# Patient Record
Sex: Male | Born: 1950 | Race: Black or African American | Hispanic: No | Marital: Married | Smoking: Current some day smoker
Health system: Southern US, Community
[De-identification: ages and names within clinical notes are randomized; demographics above are authoritative.]

## PROBLEM LIST (undated history)

## (undated) DIAGNOSIS — I639 Cerebral infarction, unspecified: Secondary | ICD-10-CM

## (undated) DIAGNOSIS — C3490 Malignant neoplasm of unspecified part of unspecified bronchus or lung: Secondary | ICD-10-CM

## (undated) DIAGNOSIS — R569 Unspecified convulsions: Secondary | ICD-10-CM

---

## 2008-06-23 HISTORY — PX: OTHER SURGICAL HISTORY: SHX169

## 2011-01-04 ENCOUNTER — Encounter: Payer: Self-pay | Admitting: *Deleted

## 2011-01-04 ENCOUNTER — Emergency Department (HOSPITAL_BASED_OUTPATIENT_CLINIC_OR_DEPARTMENT_OTHER)
Admission: EM | Admit: 2011-01-04 | Discharge: 2011-01-04 | Disposition: A | Discharge status: Home / Self Care | Payer: Managed Care, Other (non HMO) | Attending: Emergency Medicine | Admitting: Emergency Medicine

## 2011-01-04 ENCOUNTER — Emergency Department (INDEPENDENT_AMBULATORY_CARE_PROVIDER_SITE_OTHER): Payer: Managed Care, Other (non HMO)

## 2011-01-04 DIAGNOSIS — R531 Weakness: Secondary | ICD-10-CM

## 2011-01-04 DIAGNOSIS — R220 Localized swelling, mass and lump, head: Secondary | ICD-10-CM

## 2011-01-04 DIAGNOSIS — B9789 Other viral agents as the cause of diseases classified elsewhere: Secondary | ICD-10-CM | POA: Insufficient documentation

## 2011-01-04 DIAGNOSIS — R05 Cough: Secondary | ICD-10-CM

## 2011-01-04 DIAGNOSIS — R51 Headache: Secondary | ICD-10-CM

## 2011-01-04 DIAGNOSIS — R1310 Dysphagia, unspecified: Secondary | ICD-10-CM

## 2011-01-04 DIAGNOSIS — F1010 Alcohol abuse, uncomplicated: Secondary | ICD-10-CM

## 2011-01-04 DIAGNOSIS — B349 Viral infection, unspecified: Secondary | ICD-10-CM

## 2011-01-04 DIAGNOSIS — R5381 Other malaise: Secondary | ICD-10-CM | POA: Insufficient documentation

## 2011-01-04 DIAGNOSIS — E049 Nontoxic goiter, unspecified: Secondary | ICD-10-CM

## 2011-01-04 HISTORY — DX: Unspecified convulsions: R56.9

## 2011-01-04 LAB — CBC
HCT: 35.2 % — ABNORMAL LOW (ref 39.0–52.0)
MCV: 86.1 fL (ref 78.0–100.0)
RBC: 4.09 MIL/uL — ABNORMAL LOW (ref 4.22–5.81)
WBC: 8.8 10*3/uL (ref 4.0–10.5)

## 2011-01-04 LAB — DIFFERENTIAL
Eosinophils Relative: 2 % (ref 0–5)
Lymphocytes Relative: 26 % (ref 12–46)
Lymphs Abs: 2.2 10*3/uL (ref 0.7–4.0)
Monocytes Absolute: 0.9 10*3/uL (ref 0.1–1.0)
Monocytes Relative: 10 % (ref 3–12)

## 2011-01-04 LAB — COMPREHENSIVE METABOLIC PANEL
AST: 30 U/L (ref 0–37)
BUN: 8 mg/dL (ref 6–23)
CO2: 25 mEq/L (ref 19–32)
Chloride: 100 mEq/L (ref 96–112)
Creatinine, Ser: 0.9 mg/dL (ref 0.50–1.35)
GFR calc non Af Amer: >60 mL/min (ref >60)
Total Bilirubin: 0.2 mg/dL — ABNORMAL LOW (ref 0.3–1.2)

## 2011-01-04 LAB — ETHANOL: Alcohol, Ethyl (B): 99 mg/dL — ABNORMAL HIGH (ref 0–11)

## 2011-01-04 LAB — GLUCOSE, CAPILLARY: Glucose-Capillary: 112 mg/dL — ABNORMAL HIGH (ref 70–99)

## 2011-01-04 MED ORDER — SODIUM CHLORIDE 0.9 % IV SOLN
Freq: Once | INTRAVENOUS | Status: AC
  Administered 2011-01-04: 19:00:00 via INTRAVENOUS

## 2011-01-04 MED ORDER — ACETAMINOPHEN 325 MG PO TABS
650.0000 mg | ORAL_TABLET | Freq: Once | ORAL | Status: AC
  Administered 2011-01-04: 650 mg via ORAL
  Filled 2011-01-04: qty 2

## 2011-01-04 MED ORDER — IOHEXOL 300 MG/ML  SOLN
100.0000 mL | Freq: Once | INTRAMUSCULAR | Status: AC | PRN
  Administered 2011-01-04: 100 mL via INTRAVENOUS

## 2011-01-04 NOTE — ED Provider Notes (Signed)
History     Chief Complaint  Patient presents with  . Fatigue   chief complaint weakness HPI History is obtained from patient and from his spouse. Complains of nasal congestion sore throat cough( slight) onset 3 days ago patient drank alcohol this morning to "ease the pain when asked what hurts the most he states his throat also complains of nasal congestion. Temperature 102 his morning 3 AM is reported by his wife treated with Tylenol last dose 3 AM today Past Medical History  Diagnosis Date  . Seizures     Past Surgical History  Procedure Date  . Other surgical history 2010    nasal non cancerous tumor removed   Pituitary tumor History reviewed. No pertinent family history.  History  Substance Use Topics  . Smoking status: Current Some Day Smoker  . Smokeless tobacco: Not on file  . Alcohol Use: Yes      Review of Systems  Constitutional: Positive for fever and appetite change.  HENT: Positive for congestion.        Sore throat pain on swallowing  Respiratory: Positive for cough.   Cardiovascular: Negative.   Gastrointestinal: Negative.   Musculoskeletal: Negative.   Skin: Negative.   Neurological: Negative.   Hematological: Negative.   Psychiatric/Behavioral: Negative.     Physical Exam  BP 112/72  Pulse 79  Temp(Src) 98.6 F (37 C) (Oral)  Resp 19  SpO2 99%  Physical Exam  Constitutional: He is oriented to person, place, and time. He appears well-developed and well-nourished.       Sleepy, easily arousable  HENT:  Head: Normocephalic and atraumatic.  Right Ear: External ear normal.  Left Ear: External ear normal.  Nose: Nose normal.  Mouth/Throat: Oropharynx is clear and moist. No oropharyngeal exudate.  Eyes: Conjunctivae are normal. Pupils are equal, round, and reactive to light.  Neck: Neck supple. No tracheal deviation present. No thyromegaly present.       Pain on palpation the laryngeal cartilage  Cardiovascular: Normal rate and regular  rhythm.   No murmur heard. Pulmonary/Chest: Effort normal and breath sounds normal.  Abdominal: Soft. Bowel sounds are normal. He exhibits no distension. There is no tenderness.  Musculoskeletal: Normal range of motion. He exhibits no edema and no tenderness.  Neurological: He is oriented to person, place, and time. Coordination normal.       Sleepy easily arousable answers appropriately  Skin: Skin is warm and dry. No rash noted.  Psychiatric: He has a normal mood and affect.    ED Course  Procedures  MDM At 9 PM patient is alert Glasgow Coma Score 15 feels improved after intravenous fluids given apple juice in the emergency department no longer sleepy. Assessment: Patient likely with viral illness in light of nasal congestion and sore throat. clinically intoxicated upon arrival 9 PM no longer appears intoxicated and her plan patient advised to follow up with his primary care doctor Dr.  Lendon Colonel. Ultrasound of thyroid gland within the next few months discharged in stable condition      Doug Sou, MD 01/04/11 2142

## 2011-01-04 NOTE — ED Notes (Signed)
MD at bedside. 

## 2011-01-04 NOTE — ED Notes (Signed)
Per family member patient has been feeling bad since Wednesday, night sweats, sore throat, body aches, productive cough, hurts to cough, no energy, no n/v/d, able to drink & eat but appetite reduced

## 2011-01-04 NOTE — ED Notes (Signed)
Previous 02 readings inaccurate d/t pt not having sensor on his hand correctly. This was repositioned.

## 2012-06-14 IMAGING — CR DG CHEST 2V
2 series · 2 of 2 positions shown · non-contrast
Comparison: None

CLINICAL DATA: Cough.  Swelling in the throat.

CHEST - 2 VIEW

[w chest pa]
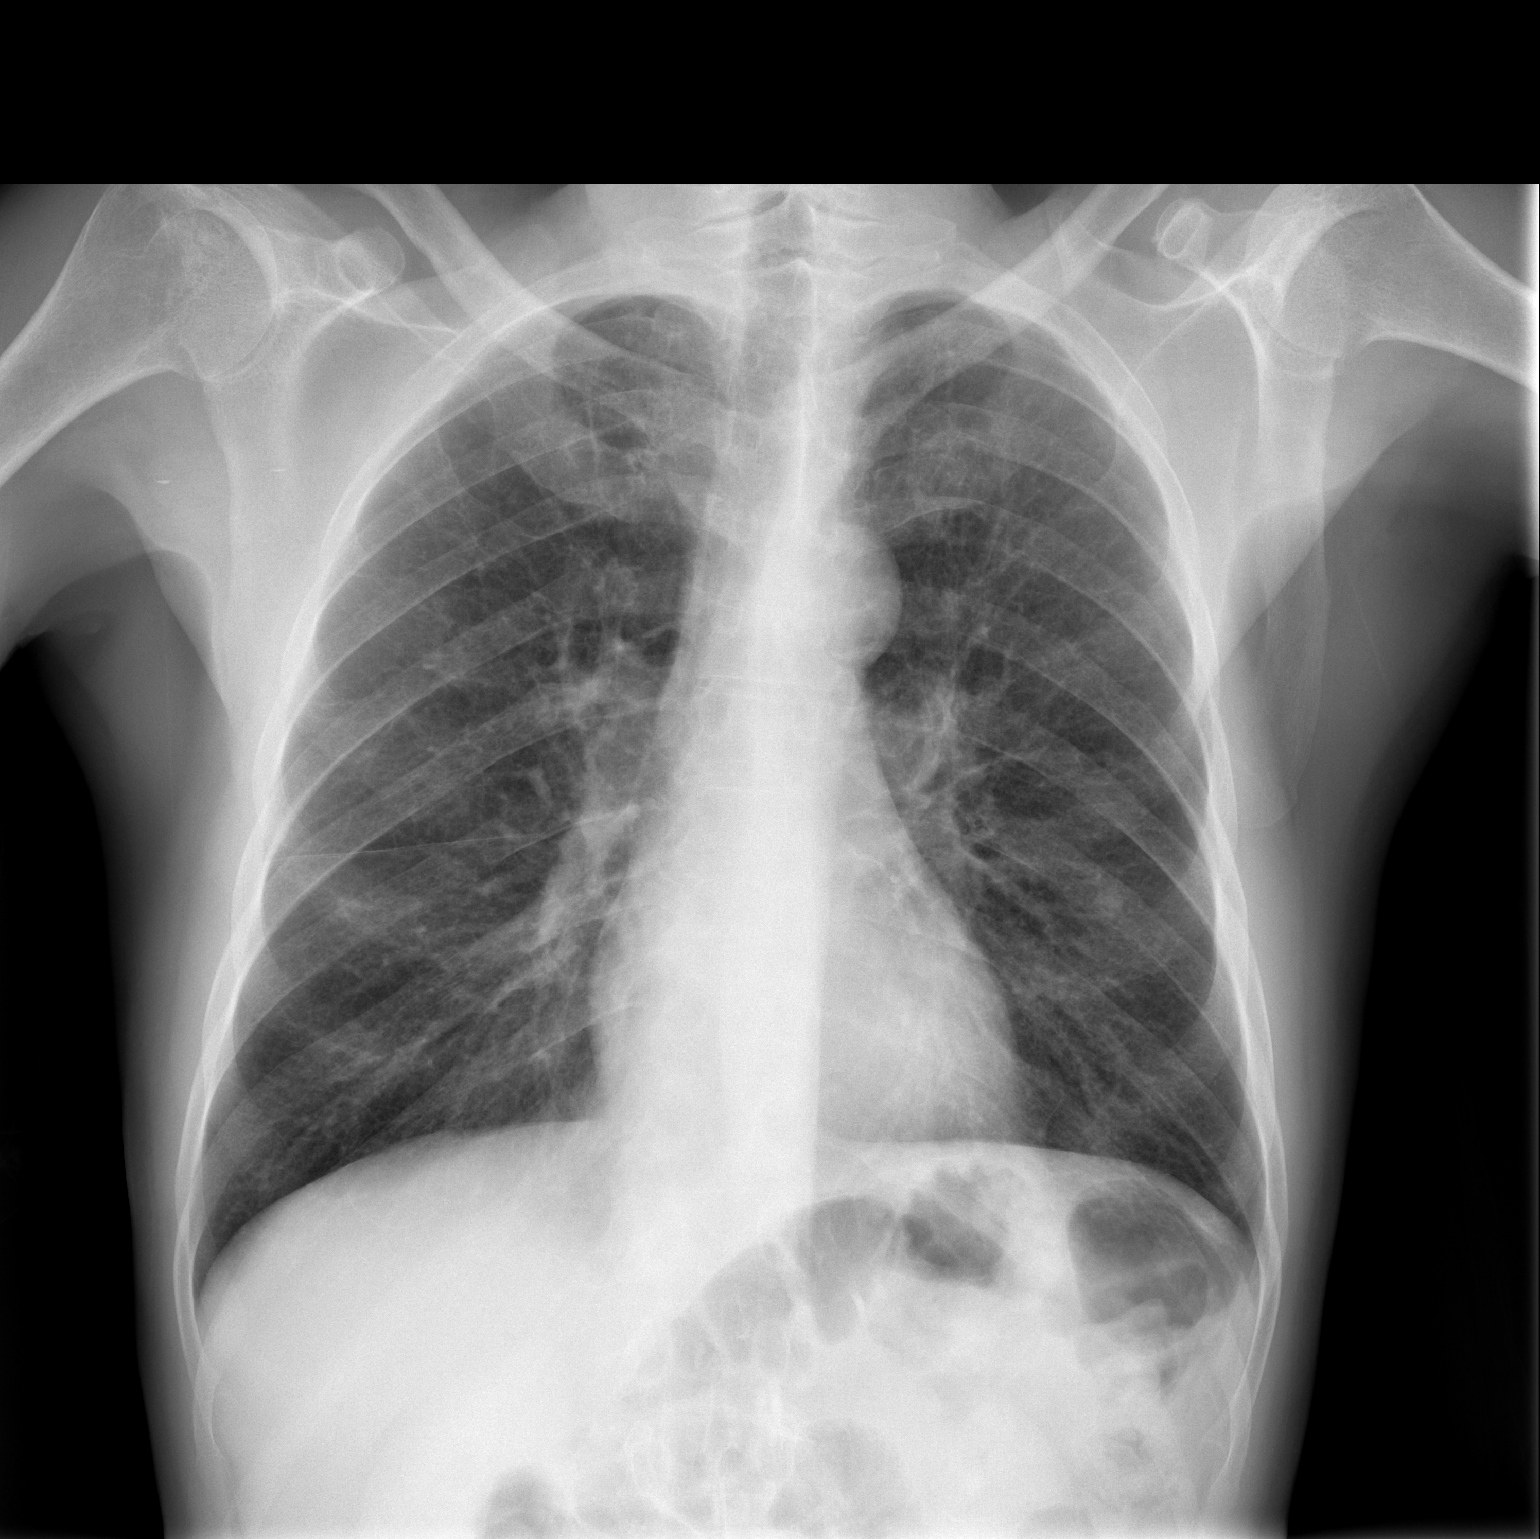

[w chest lat]
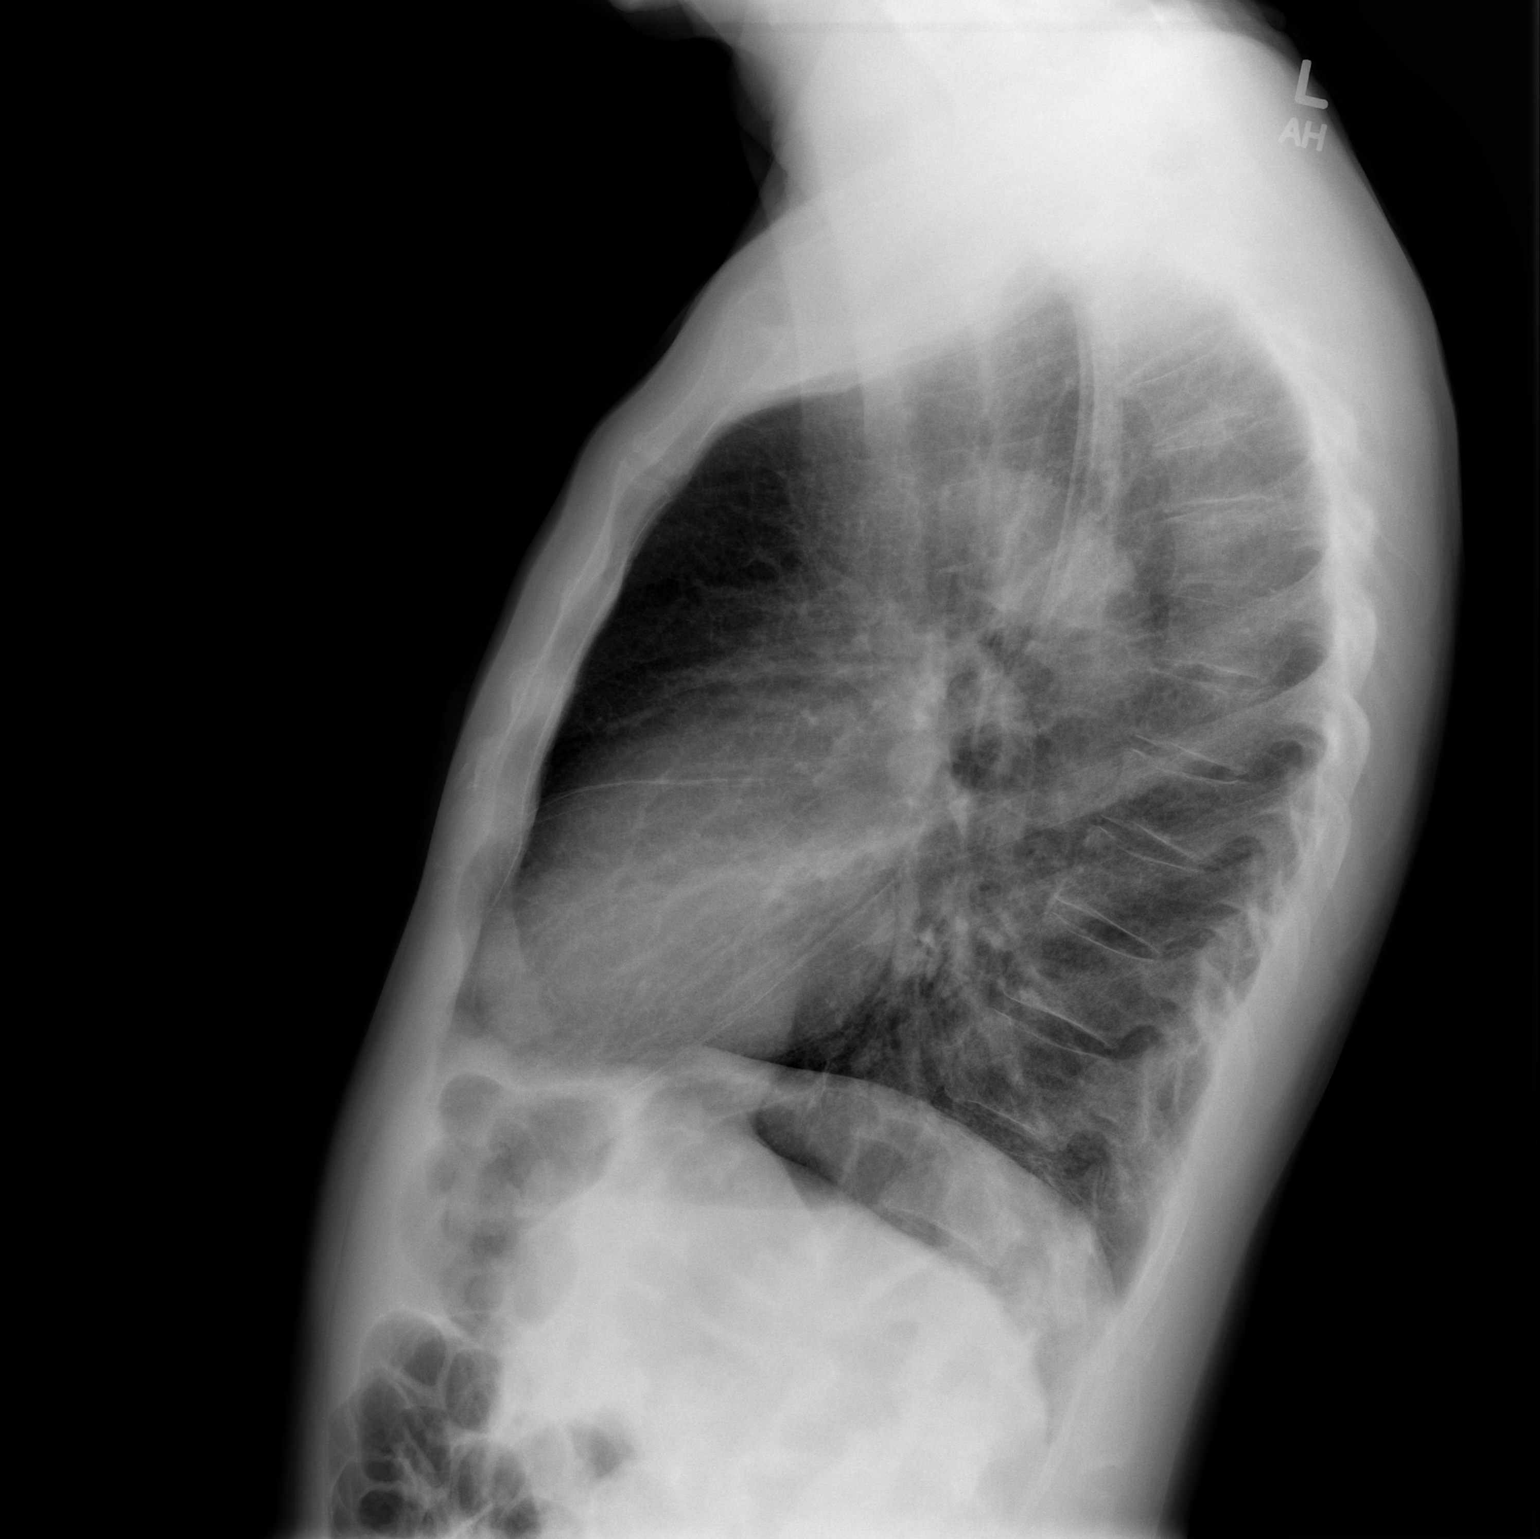

[2 of 2 positions shown; findings below may reference images not displayed]

FINDINGS: Heart size is normal.  Mediastinal shadows are normal.
The lung shows slightly prominent interstitial markings but no
evidence of infiltrate, mass, effusion or collapse.  I think there
are probably bilateral nipple shadows.  No significant bony
finding.
IMPRESSION: No definite active process.  Slightly prominent interstitial
markings.  These are probably chronic.  Probable bilateral nipple
shadows.

## 2012-11-27 DIAGNOSIS — I10 Essential (primary) hypertension: Secondary | ICD-10-CM | POA: Diagnosis present

## 2012-11-27 DIAGNOSIS — J449 Chronic obstructive pulmonary disease, unspecified: Secondary | ICD-10-CM | POA: Diagnosis present

## 2017-12-22 DIAGNOSIS — F17200 Nicotine dependence, unspecified, uncomplicated: Secondary | ICD-10-CM | POA: Diagnosis present

## 2022-12-29 ENCOUNTER — Other Ambulatory Visit: Payer: Self-pay

## 2022-12-29 ENCOUNTER — Emergency Department (HOSPITAL_BASED_OUTPATIENT_CLINIC_OR_DEPARTMENT_OTHER): Payer: Medicare PPO

## 2022-12-29 ENCOUNTER — Encounter (HOSPITAL_BASED_OUTPATIENT_CLINIC_OR_DEPARTMENT_OTHER): Payer: Self-pay | Admitting: Radiology

## 2022-12-29 ENCOUNTER — Inpatient Hospital Stay (HOSPITAL_BASED_OUTPATIENT_CLINIC_OR_DEPARTMENT_OTHER)
Admission: EM | Admit: 2022-12-29 | Discharge: 2023-01-05 | DRG: 438 | Disposition: A | Discharge status: Home / Self Care | Payer: Medicare PPO | Attending: Obstetrics and Gynecology | Admitting: Obstetrics and Gynecology

## 2022-12-29 DIAGNOSIS — R0902 Hypoxemia: Secondary | ICD-10-CM | POA: Diagnosis not present

## 2022-12-29 DIAGNOSIS — K567 Ileus, unspecified: Secondary | ICD-10-CM | POA: Diagnosis not present

## 2022-12-29 DIAGNOSIS — E876 Hypokalemia: Secondary | ICD-10-CM | POA: Diagnosis not present

## 2022-12-29 DIAGNOSIS — J449 Chronic obstructive pulmonary disease, unspecified: Secondary | ICD-10-CM | POA: Diagnosis not present

## 2022-12-29 DIAGNOSIS — R112 Nausea with vomiting, unspecified: Secondary | ICD-10-CM | POA: Diagnosis present

## 2022-12-29 DIAGNOSIS — F1010 Alcohol abuse, uncomplicated: Secondary | ICD-10-CM | POA: Diagnosis present

## 2022-12-29 DIAGNOSIS — Z79899 Other long term (current) drug therapy: Secondary | ICD-10-CM | POA: Diagnosis not present

## 2022-12-29 DIAGNOSIS — Z681 Body mass index (BMI) 19 or less, adult: Secondary | ICD-10-CM

## 2022-12-29 DIAGNOSIS — K860 Alcohol-induced chronic pancreatitis: Secondary | ICD-10-CM | POA: Diagnosis present

## 2022-12-29 DIAGNOSIS — I10 Essential (primary) hypertension: Secondary | ICD-10-CM | POA: Diagnosis not present

## 2022-12-29 DIAGNOSIS — F17200 Nicotine dependence, unspecified, uncomplicated: Secondary | ICD-10-CM | POA: Diagnosis present

## 2022-12-29 DIAGNOSIS — K852 Alcohol induced acute pancreatitis without necrosis or infection: Principal | ICD-10-CM | POA: Diagnosis present

## 2022-12-29 DIAGNOSIS — K7010 Alcoholic hepatitis without ascites: Secondary | ICD-10-CM | POA: Diagnosis not present

## 2022-12-29 DIAGNOSIS — E785 Hyperlipidemia, unspecified: Secondary | ICD-10-CM | POA: Diagnosis not present

## 2022-12-29 DIAGNOSIS — R64 Cachexia: Secondary | ICD-10-CM | POA: Diagnosis not present

## 2022-12-29 DIAGNOSIS — E43 Unspecified severe protein-calorie malnutrition: Secondary | ICD-10-CM | POA: Diagnosis present

## 2022-12-29 DIAGNOSIS — I493 Ventricular premature depolarization: Secondary | ICD-10-CM | POA: Diagnosis present

## 2022-12-29 DIAGNOSIS — Z886 Allergy status to analgesic agent status: Secondary | ICD-10-CM

## 2022-12-29 DIAGNOSIS — G40909 Epilepsy, unspecified, not intractable, without status epilepticus: Secondary | ICD-10-CM | POA: Diagnosis not present

## 2022-12-29 DIAGNOSIS — R918 Other nonspecific abnormal finding of lung field: Secondary | ICD-10-CM | POA: Insufficient documentation

## 2022-12-29 LAB — CBC WITH DIFFERENTIAL/PLATELET
Abs Immature Granulocytes: 0.03 10*3/uL (ref 0.00–0.07)
Basophils Absolute: 0 10*3/uL (ref 0.0–0.1)
Basophils Relative: 0 %
Eosinophils Absolute: 0 10*3/uL (ref 0.0–0.5)
Eosinophils Relative: 0 %
HCT: 32.6 % — ABNORMAL LOW (ref 39.0–52.0)
Hemoglobin: 11.1 g/dL — ABNORMAL LOW (ref 13.0–17.0)
Immature Granulocytes: 0 %
Lymphocytes Relative: 11 %
Lymphs Abs: 1.1 10*3/uL (ref 0.7–4.0)
MCH: 29 pg (ref 26.0–34.0)
MCHC: 34 g/dL (ref 30.0–36.0)
MCV: 85.1 fL (ref 80.0–100.0)
Monocytes Absolute: 0.4 10*3/uL (ref 0.1–1.0)
Monocytes Relative: 4 %
Neutro Abs: 7.9 10*3/uL — ABNORMAL HIGH (ref 1.7–7.7)
Neutrophils Relative %: 85 %
Platelets: 163 10*3/uL (ref 150–400)
RBC: 3.83 MIL/uL — ABNORMAL LOW (ref 4.22–5.81)
RDW: 13.3 % (ref 11.5–15.5)
WBC: 9.5 10*3/uL (ref 4.0–10.5)
nRBC: 0 % (ref 0.0–0.2)

## 2022-12-29 LAB — COMPREHENSIVE METABOLIC PANEL
ALT: 121 U/L — ABNORMAL HIGH (ref 0–44)
AST: 187 U/L — ABNORMAL HIGH (ref 15–41)
Albumin: 3.4 g/dL — ABNORMAL LOW (ref 3.5–5.0)
Alkaline Phosphatase: 124 U/L (ref 38–126)
Anion gap: 13 (ref 5–15)
BUN: 24 mg/dL — ABNORMAL HIGH (ref 8–23)
CO2: 21 mmol/L — ABNORMAL LOW (ref 22–32)
Calcium: 9 mg/dL (ref 8.9–10.3)
Chloride: 103 mmol/L (ref 98–111)
Creatinine, Ser: 0.93 mg/dL (ref 0.61–1.24)
GFR, Estimated: >60 mL/min (ref >60)
Glucose, Bld: 121 mg/dL — ABNORMAL HIGH (ref 70–99)
Potassium: 3.9 mmol/L (ref 3.5–5.1)
Sodium: 137 mmol/L (ref 135–145)
Total Bilirubin: 0.6 mg/dL (ref 0.3–1.2)
Total Protein: 7.7 g/dL (ref 6.5–8.1)

## 2022-12-29 LAB — TROPONIN I (HIGH SENSITIVITY)
Troponin I (High Sensitivity): 4 ng/L (ref <18)
Troponin I (High Sensitivity): 5 ng/L (ref <18)

## 2022-12-29 LAB — LACTIC ACID, PLASMA
Lactic Acid, Venous: 2.3 mmol/L (ref 0.5–1.9)
Lactic Acid, Venous: 1.1 mmol/L (ref 0.5–1.9)

## 2022-12-29 LAB — ETHANOL: Alcohol, Ethyl (B): <10 mg/dL (ref <10)

## 2022-12-29 LAB — LIPASE, BLOOD: Lipase: 335 U/L — ABNORMAL HIGH (ref 11–51)

## 2022-12-29 MED ORDER — SODIUM CHLORIDE 0.9 % IV BOLUS
1000.0000 mL | Freq: Once | INTRAVENOUS | Status: AC
  Administered 2022-12-29: 1000 mL via INTRAVENOUS

## 2022-12-29 MED ORDER — ONDANSETRON HCL 4 MG/2ML IJ SOLN
4.0000 mg | Freq: Once | INTRAMUSCULAR | Status: AC
  Administered 2022-12-29: 4 mg via INTRAVENOUS
  Filled 2022-12-29: qty 2

## 2022-12-29 MED ORDER — ONDANSETRON HCL 4 MG PO TABS: 4.0000 mg | ORAL_TABLET | Freq: Four times a day (QID) | ORAL | Status: DC | PRN

## 2022-12-29 MED ORDER — TRAMADOL HCL 50 MG PO TABS
50.0000 mg | ORAL_TABLET | Freq: Three times a day (TID) | ORAL | Status: DC | PRN
  Administered 2022-12-29 – 2022-12-31 (×5): 50 mg via ORAL
  Filled 2022-12-29 (×5): qty 1

## 2022-12-29 MED ORDER — FENTANYL CITRATE PF 50 MCG/ML IJ SOSY
50.0000 ug | PREFILLED_SYRINGE | Freq: Once | INTRAMUSCULAR | Status: AC
  Administered 2022-12-29: 50 ug via INTRAVENOUS
  Filled 2022-12-29: qty 1

## 2022-12-29 MED ORDER — MAGNESIUM HYDROXIDE 400 MG/5ML PO SUSP: 30.0000 mL | Freq: Every day | ORAL | Status: DC | PRN

## 2022-12-29 MED ORDER — PANTOPRAZOLE SODIUM 40 MG IV SOLR
40.0000 mg | Freq: Once | INTRAVENOUS | Status: AC
  Administered 2022-12-29: 40 mg via INTRAVENOUS
  Filled 2022-12-29: qty 10

## 2022-12-29 MED ORDER — TRAZODONE HCL 50 MG PO TABS
25.0000 mg | ORAL_TABLET | Freq: Every evening | ORAL | Status: DC | PRN
  Administered 2022-12-29 – 2023-01-04 (×3): 25 mg via ORAL
  Filled 2022-12-29 (×3): qty 1

## 2022-12-29 MED ORDER — IOHEXOL 300 MG/ML  SOLN
100.0000 mL | Freq: Once | INTRAMUSCULAR | Status: AC | PRN
  Administered 2022-12-29: 100 mL via INTRAVENOUS

## 2022-12-29 MED ORDER — ENOXAPARIN SODIUM 40 MG/0.4ML IJ SOSY
40.0000 mg | PREFILLED_SYRINGE | INTRAMUSCULAR | Status: DC
  Administered 2022-12-29 – 2023-01-04 (×6): 40 mg via SUBCUTANEOUS
  Filled 2022-12-29 (×7): qty 0.4

## 2022-12-29 MED ORDER — ONDANSETRON HCL 4 MG/2ML IJ SOLN
4.0000 mg | Freq: Four times a day (QID) | INTRAMUSCULAR | Status: DC | PRN
  Administered 2022-12-31: 4 mg via INTRAVENOUS
  Filled 2022-12-29: qty 2

## 2022-12-29 MED ORDER — SODIUM CHLORIDE 0.9 % IV SOLN: INTRAVENOUS | Status: DC

## 2022-12-29 NOTE — Plan of Care (Signed)
Called for direct admission by Dr Fredderick Phenix.   72 y/o with abdominal pain who drinks alcohol heavily found to have elevated Lipase and elevated LFTs.  No CT evidence of acute pancreatitis, cholecystitis or dilated bile ducts but upon my review of the report the patient does have calcifications in his pancreas with a dilated pancreatic duct and a fatty liver.  Lactic acid is mildly elevated at 2.3. Bicarb 21.  He has received IVF, IV antiemetics and IV narcotics.  Will place him under observation status for his symptoms.    Calvert Cantor, MD

## 2022-12-29 NOTE — ED Notes (Signed)
Pt. Has gone to CT scan after pain meds given

## 2022-12-29 NOTE — ED Triage Notes (Signed)
Patient presents to ED via POV from home. Here with abdominal pain that began around 0600. Family reports patient is not acting his normal self. Family report he is not talking. Patient verbal in triage. Wincing in pain. Family expresses concern for ruptured appendix. Last known well was 0600 this morning. Denies one sided weakness.

## 2022-12-29 NOTE — Assessment & Plan Note (Signed)
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 - The patient will be admitted to a medical observation bed. - He will be kept NPO. - Pain management will be provided. - Serial lipase levels will be followed.

## 2022-12-29 NOTE — ED Provider Notes (Signed)
[name] Provider Note   CSN: [id number] Arrival date & time: 12/29/22  1107     History  Chief Complaint  Patient presents with   Abdominal Pain    Lonnie Blair is a 72 y.o. male.  Patient is a 72 year old male who presents with abdominal pain.  History is obtained through the patient and his wife/daughter.  He has a history of COPD, hyperlipidemia heavy alcohol use per his wife.  He reportedly drinks beer daily.  He had started having some abdominal pain this morning.  It is coming and sharp waves.  It is across his upper abdomen primarily.  He had some associated nausea and vomiting.  His emesis is nonbloody and nonbilious.  No change in his stools.  No urinary symptoms.  Initially it did seemed that he was not communicating effectively although this seems to be more related to his pain.         Home Medications Prior to Admission medications   Medication Sig Start Date End Date Taking? Authorizing Provider  acetaminophen (TYLENOL) 325 MG tablet Take 650 mg by mouth as needed.      [provider]  acetaminophen (TYLENOL) 500 MG tablet Take 500 mg by mouth once as needed. For pain and fever     [provider]  levETIRAcetam (KEPPRA) 1000 MG tablet Take 1,000 mg by mouth 2 (two) times daily.      [provider]  PRESCRIPTION MEDICATION     [provider]  UNABLE TO FIND Med Name:     [provider]      Allergies    Tylenol [acetaminophen]    Review of Systems   Review of Systems  Constitutional:  Positive for fatigue. Negative for chills, diaphoresis and fever.  HENT:  Negative for congestion, rhinorrhea and sneezing.   Eyes: Negative.   Respiratory:  Negative for cough, chest tightness and shortness of breath.   Cardiovascular:  Negative for chest pain and leg swelling.  Gastrointestinal:  Positive for abdominal pain, nausea and vomiting. Negative for blood in stool and  diarrhea.  Genitourinary:  Negative for difficulty urinating, flank pain, frequency and hematuria.  Musculoskeletal:  Negative for arthralgias and back pain.  Skin:  Negative for rash.  Neurological:  Negative for dizziness, speech difficulty, weakness, numbness and headaches.    Physical Exam Updated Vital Signs BP (!) 149/86   Pulse 86   Temp (!) 97 F (36.1 C)   Resp 19   SpO2 100%  Physical Exam Constitutional:      Appearance: He is well-developed.  HENT:     Head: Normocephalic and atraumatic.  Eyes:     Pupils: Pupils are equal, round, and reactive to light.  Cardiovascular:     Rate and Rhythm: Normal rate and regular rhythm.     Heart sounds: Normal heart sounds.  Pulmonary:     Effort: Pulmonary effort is normal. No respiratory distress.     Breath sounds: Normal breath sounds. No wheezing or rales.  Chest:     Chest wall: No tenderness.  Abdominal:     General: Bowel sounds are normal.     Palpations: Abdomen is soft.     Tenderness: There is generalized abdominal tenderness. There is no guarding or rebound.  Musculoskeletal:        General: Normal range of motion.     Cervical back: Normal range of motion and neck supple.  Lymphadenopathy:  Cervical: No cervical adenopathy.  Skin:    General: Skin is warm and dry.     Findings: No rash.  Neurological:     General: No focal deficit present.     Mental Status: He is alert and oriented to person, place, and time.     ED Results / Procedures / Treatments   Labs (all labs ordered are listed, but only abnormal results are displayed) Labs Reviewed  COMPREHENSIVE METABOLIC PANEL - Abnormal; Notable for the following components:      Result Value   CO2 21 (*)    Glucose, Bld 121 (*)    BUN 24 (*)    Albumin 3.4 (*)    AST 187 (*)    ALT 121 (*)    All other components within normal limits  LIPASE, BLOOD - Abnormal; Notable for the following components:   Lipase 335 (*)    All other components  within normal limits  LACTIC ACID, PLASMA - Abnormal; Notable for the following components:   Lactic Acid, Venous 2.3 (*)    All other components within normal limits  CBC WITH DIFFERENTIAL/PLATELET - Abnormal; Notable for the following components:   RBC 3.83 (*)    Hemoglobin 11.1 (*)    HCT 32.6 (*)    Neutro Abs 7.9 (*)    All other components within normal limits  ETHANOL  LACTIC ACID, PLASMA  TROPONIN I (HIGH SENSITIVITY)  TROPONIN I (HIGH SENSITIVITY)    EKG EKG Interpretation Date/Time:  Monday December 29 2022 11:27:08 EDT Ventricular Rate:  84 PR Interval:  144 QRS Duration:  78 QT Interval:  363 QTC Calculation: 430 R Axis:   83  Text Interpretation: Sinus rhythm Ventricular premature complex Right atrial enlargement Borderline right axis deviation ST elevation, consider inferior injury No old tracing to compare Confirmed by Rolan Bucco [phone]) on 12/29/2022 11:45:50 AM  Radiology CT ABDOMEN PELVIS W CONTRAST  Result Date: 12/29/2022 CLINICAL DATA:  Abdominal pain, acute, nonlocalized EXAM: CT ABDOMEN AND PELVIS WITH CONTRAST TECHNIQUE: Multidetector CT imaging of the abdomen and pelvis was performed using the standard protocol following bolus administration of intravenous contrast. RADIATION DOSE REDUCTION: This exam was performed according to the departmental dose-optimization program which includes automated exposure control, adjustment of the mA and/or kV according to patient size and/or use of iterative reconstruction technique. CONTRAST:  OMNIPAQUE IOHEXOL 300 MG/ML  SOLN COMPARISON:  CT scan chest from 04/25/2021 and CT scan abdomen and pelvis from 01/10/2013. FINDINGS: Lower chest: The lung bases are clear. Scattered emphysematous blebs noted in the imaged lung bases. No pleural effusion. The heart is normal in size. No pericardial effusion. Hepatobiliary: The liver is normal in size. Non-cirrhotic configuration. No suspicious mass. These is mild diffuse hepatic  steatosis. No intrahepatic or extrahepatic bile duct dilation. No calcified gallstones. Normal gallbladder wall thickness. No pericholecystic inflammatory changes. Pancreas: No focal pancreatic lesion. Main pancreatic duct is dilated measuring up to 6-7 mm; however, no obstructing mass seen. There are scattered sub 5 mm foci of calcifications in the pancreas, nonspecific but can be seen with sequela of chronic pancreatitis. No peripancreatic fat stranding. Spleen: Within normal limits. No focal lesion. Adrenals/Urinary Tract: Adrenal glands are unremarkable. No suspicious renal mass. No hydronephrosis. No renal or ureteric calculi. Unremarkable urinary bladder. Stomach/Bowel: No disproportionate dilation of the small or large bowel loops. No evidence of abnormal bowel wall thickening or inflammatory changes. The appendix was not confidently visualized; however there is no acute inflammatory process in  the right lower quadrant. Vascular/Lymphatic: No ascites or pneumoperitoneum. No abdominal or pelvic lymphadenopathy, by size criteria. No aneurysmal dilation of the major abdominal arteries. There are marked peripheral atherosclerotic vascular calcifications of the aorta and its major branches. Reproductive: Normal size prostate. Symmetric seminal vesicles. Other: The visualized soft tissues and abdominal wall are unremarkable. Musculoskeletal: No suspicious osseous lesions. There are mild multilevel degenerative changes in the visualized spine. IMPRESSION: 1. No acute inflammatory process identified within the abdomen or pelvis. 2. Multiple other nonacute observations, as described above. Aortic Atherosclerosis (ICD10-I70.0) and Emphysema (ICD10-J43.9). Electronically Signed   By: Jules Schick M.D.   On: 12/29/2022 14:02    Procedures Procedures    Medications Ordered in ED Medications  pantoprazole (PROTONIX) injection 40 mg (40 mg Intravenous Given 12/29/22 1210)  sodium chloride 0.9 % bolus 1,000 mL (0  mLs Intravenous Stopped 12/29/22 1420)  ondansetron (ZOFRAN) injection 4 mg (4 mg Intravenous Given 12/29/22 1208)  iohexol (OMNIPAQUE) 300 MG/ML solution 100 mL (100 mLs Intravenous Contrast Given 12/29/22 1334)  fentaNYL (SUBLIMAZE) injection 50 mcg (50 mcg Intravenous Given 12/29/22 1325)    ED Course/ Medical Decision Making/ A&P                             Medical Decision Making Amount and/or Complexity of Data Reviewed Labs: ordered. Radiology: ordered.  Risk Prescription drug management. Decision regarding hospitalization.   Patient is a 72 year old male who presents with intense abdominal pain.  This mostly across his upper abdomen.  She had associated vomiting.  His blood pressure was borderline low on arrival but has been improving after IV fluids.  His pain is improved with pain medication and antiemetics.  His labs show an elevated lipase consistent with pancreatitis.  His LFTs are also elevated which is likely from his alcohol use although we do not have any recent LFTs on file for him.  CT scan shows no acute abnormalities.  No evidence of gallbladder wall thickening or gallstones.  His lactate is mildly elevated.  However he is afebrile and his WBC count is normal.  Will hold off on antibiotics as he does not have any other indications of sepsis or infectious etiology.  He was given Protonix as well.  Discussed with Dr. Butler Denmark who will admit the patient for further treatment.  Final Clinical Impression(s) / ED Diagnoses Final diagnoses:  Alcohol-induced acute pancreatitis without infection or necrosis    Rx / DC Orders ED Discharge Orders     None         Rolan Bucco, MD 12/29/22 1436

## 2022-12-29 NOTE — ED Notes (Signed)
Attempting new IV site due to Other site bothering Pt.

## 2022-12-29 NOTE — H&P (Signed)
[place]   PATIENT NAME: Lonnie Blair    MR#:  [id number]  DATE OF BIRTH:  [birth year]  DATE OF ADMISSION:  12/29/2022  PRIMARY CARE PHYSICIAN: Diamantina Providence, FNP   Patient is coming from: Home  REQUESTING/REFERRING PHYSICIAN: [facility]  CHIEF COMPLAINT:   Chief Complaint  Patient presents with   Abdominal Pain    HISTORY OF PRESENT ILLNESS:  Avetis Caster is a 72 y.o. male with medical history significant for seizure disorder as well as alcohol abuse, presented to the emergency room with a [place]etti of epigastric and left upper quadrant abdominal pain that started at 3 AM today with associated recurrent nausea and vomiting without diarrhea.  His last alcoholic drink was beer yesterday.  He admitted to chills without measured fever.  Has been having mild urinary urgency without frequency or dysuria or hematuria or flank pain.  No chest pain or palpitations.  No cough or wheezing or dyspnea.  ED Course: When he came to the ER, his.  Gwyndolyn Kaufman rate was 25 and later on 21 with otherwise normal vital signs.  Labs revealed a BUN of 24 with CO2 of 21, albumin 3.4 and elevated lipase of 335 AST of 187 and ALT of 121.  High sensitive troponin I was 4 and later 5 and lactic acid 2.3 and later 1.1.  CBC showed anemia with hemoglobin 11.1 hematocrit 32.6 slightly lower than previous levels 12 years ago.  Alcohol level was less than 10. EKG as reviewed by me : EKG showed sinus rhythm with a rate of 84 with PVCs and right atrial enlargement.  It showed borderline right axis deviation. Imaging: Abdominal pelvic CT scan revealed aortic atherosclerosis and emphysema with no acute abdominal or pelvic findings as mentioned below.  The patient was given 4 mg of IV Zofran twice, 40 mg of IV Protonix, 50 mcg of IV fentanyl and 1 L bolus of IV normal saline.  He will be admitted to a medical observation bed for further evaluation and management. PAST MEDICAL HISTORY:   Past Medical History:   Diagnosis Date   Seizures (HCC)     PAST SURGICAL HISTORY:   Past Surgical History:  Procedure Laterality Date   OTHER SURGICAL HISTORY  2010   nasal non cancerous tumor removed    SOCIAL HISTORY:   Social History   Tobacco Use   Smoking status: Some Days   Smokeless tobacco: Not on file  Substance Use Topics   Alcohol use: Yes    FAMILY HISTORY:  History reviewed. No pertinent family history.  DRUG ALLERGIES:   Allergies  Allergen Reactions   Tylenol [Acetaminophen] Itching    REVIEW OF SYSTEMS:   ROS As per history of present illness. All pertinent systems were reviewed above. Constitutional, HEENT, cardiovascular, respiratory, GI, GU, musculoskeletal, neuro, psychiatric, endocrine, integumentary and hematologic systems were reviewed and are otherwise negative/unremarkable except for positive findings mentioned above in the HPI.   MEDICATIONS AT HOME:   Prior to Admission medications   Medication Sig Start Date End Date Taking? Authorizing Provider  lactose free nutrition (BOOST PLUS) LIQD Take 237 mLs by mouth See admin instructions. Drink 237 ml's of Strawberry Boost Plus by mouth two times a day   Yes [provider]  zonisamide (ZONEGRAN) 100 MG capsule Take 100 mg by mouth in the morning and at bedtime.   Yes [provider]      VITAL SIGNS:  Blood pressure (!) 143/83, pulse 86, temperature (!)  97.5 F (36.4 C), temperature source Oral, resp. rate 17, height 5\' 6"  (1.676 m), weight 48 kg, SpO2 100 %.  PHYSICAL EXAMINATION:  Physical Exam  GENERAL:  72 y.o.-year-old African-American male patient lying in the bed with no acute distress.  EYES: Pupils equal, round, reactive to light and accommodation. No scleral icterus. Extraocular muscles intact.  HEENT: Head atraumatic, normocephalic. Oropharynx and nasopharynx clear.  NECK:  Supple, no jugular venous distention. No thyroid enlargement, no tenderness.  LUNGS: Normal breath sounds  bilaterally, no wheezing, rales,rhonchi or crepitation. No use of accessory muscles of respiration.  CARDIOVASCULAR: Regular rate and rhythm, S1, S2 normal. No murmurs, rubs, or gallops.  ABDOMEN: Soft, nondistended, with epigastric and left upper quadrant tenderness without rebound tenderness guarding or rigidity.. Bowel sounds present. No organomegaly or mass.  EXTREMITIES: No pedal edema, cyanosis, or clubbing.  NEUROLOGIC: Cranial nerves II through XII are intact. Muscle strength 5/5 in all extremities. Sensation intact. Gait not checked.  PSYCHIATRIC: The patient is alert and oriented x 3.  Normal affect and good eye contact. SKIN: No obvious rash, lesion, or ulcer.   LABORATORY PANEL:   CBC Recent Labs  Lab 12/29/22 1142  WBC 9.5  HGB 11.1*  HCT 32.6*  PLT 163   ------------------------------------------------------------------------------------------------------------------  Chemistries  Recent Labs  Lab 12/29/22 1142  NA 137  K 3.9  CL 103  CO2 21*  GLUCOSE 121*  BUN 24*  CREATININE 0.93  CALCIUM 9.0  AST 187*  ALT 121*  ALKPHOS 124  BILITOT 0.6   ------------------------------------------------------------------------------------------------------------------  Cardiac Enzymes No results for input(s): "TROPONINI" in the last 168 hours. ------------------------------------------------------------------------------------------------------------------  RADIOLOGY:  CT ABDOMEN PELVIS W CONTRAST  Result Date: 12/29/2022 CLINICAL DATA:  Abdominal pain, acute, nonlocalized EXAM: CT ABDOMEN AND PELVIS WITH CONTRAST TECHNIQUE: Multidetector CT imaging of the abdomen and pelvis was performed using the standard protocol following bolus administration of intravenous contrast. RADIATION DOSE REDUCTION: This exam was performed according to the departmental dose-optimization program which includes automated exposure control, adjustment of the mA and/or kV according to patient  size and/or use of iterative reconstruction technique. CONTRAST:  OMNIPAQUE IOHEXOL 300 MG/ML  SOLN COMPARISON:  CT scan chest from 04/25/2021 and CT scan abdomen and pelvis from 01/10/2013. FINDINGS: Lower chest: The lung bases are clear. Scattered emphysematous blebs noted in the imaged lung bases. No pleural effusion. The heart is normal in size. No pericardial effusion. Hepatobiliary: The liver is normal in size. Non-cirrhotic configuration. No suspicious mass. These is mild diffuse hepatic steatosis. No intrahepatic or extrahepatic bile duct dilation. No calcified gallstones. Normal gallbladder wall thickness. No pericholecystic inflammatory changes. Pancreas: No focal pancreatic lesion. Main pancreatic duct is dilated measuring up to 6-7 mm; however, no obstructing mass seen. There are scattered sub 5 mm foci of calcifications in the pancreas, nonspecific but can be seen with sequela of chronic pancreatitis. No peripancreatic fat stranding. Spleen: Within normal limits. No focal lesion. Adrenals/Urinary Tract: Adrenal glands are unremarkable. No suspicious renal mass. No hydronephrosis. No renal or ureteric calculi. Unremarkable urinary bladder. Stomach/Bowel: No disproportionate dilation of the small or large bowel loops. No evidence of abnormal bowel wall thickening or inflammatory changes. The appendix was not confidently visualized; however there is no acute inflammatory process in the right lower quadrant. Vascular/Lymphatic: No ascites or pneumoperitoneum. No abdominal or pelvic lymphadenopathy, by size criteria. No aneurysmal dilation of the major abdominal arteries. There are marked peripheral atherosclerotic vascular calcifications of the aorta and its major branches. Reproductive: Normal  size prostate. Symmetric seminal vesicles. Other: The visualized soft tissues and abdominal wall are unremarkable. Musculoskeletal: No suspicious osseous lesions. There are mild multilevel degenerative changes  in the visualized spine. IMPRESSION: 1. No acute inflammatory process identified within the abdomen or pelvis. 2. Multiple other nonacute observations, as described above. Aortic Atherosclerosis (ICD10-I70.0) and Emphysema (ICD10-J43.9). Electronically Signed   By: Jules Schick M.D.   On: 12/29/2022 14:02      IMPRESSION AND PLAN:  Assessment and Plan: * Acute alcoholic pancreatitis - This is acute on chronic alcoholic pancreatitis with associated mild hepatitis. - The patient will be admitted to a medical observation bed. - He will be kept NPO. - Pain management will be provided. - Serial lipase levels will be followed.  Intractable vomiting with nausea - This is clearly secondary to #1. - Management as above. - Will add IV PPI therapy for the possibility of underlying gastritis.  Seizure disorder (HCC) - We will continue Zonegran.    DVT prophylaxis: Lovenox.  Advanced Care Planning:  Code Status: full code.  Family Communication:  The plan of care was discussed in details with the patient (and family). I answered all questions. The patient agreed to proceed with the above mentioned plan. Further management will depend upon hospital course. Disposition Plan: Back to previous home environment Consults called: none.  All the records are reviewed and case discussed with ED provider.  Status is: Observation  I certify that at the time of admission, it is my clinical judgment that the patient will require hospital care extending less than 2 midnights.                            Dispo: The patient is from: Home              Anticipated d/c is to: Home              Patient currently is not medically stable to d/c.              Difficult to place patient: No  Hannah Beat M.D on 12/29/2022 at 8:28 PM  [facility]   From 7 PM-7 AM, contact night-coverage [email or web address]  CC: Primary care physician; Diamantina Providence, FNP

## 2022-12-29 NOTE — Assessment & Plan Note (Signed)
-   This is clearly secondary to #1. - Management as above. - Will add IV PPI therapy for the possibility of underlying gastritis.

## 2022-12-29 NOTE — Assessment & Plan Note (Signed)
-   We will continue Zonegran.

## 2022-12-30 DIAGNOSIS — E785 Hyperlipidemia, unspecified: Secondary | ICD-10-CM | POA: Diagnosis present

## 2022-12-30 DIAGNOSIS — K567 Ileus, unspecified: Secondary | ICD-10-CM | POA: Diagnosis not present

## 2022-12-30 DIAGNOSIS — E876 Hypokalemia: Secondary | ICD-10-CM

## 2022-12-30 DIAGNOSIS — Z681 Body mass index (BMI) 19 or less, adult: Secondary | ICD-10-CM | POA: Diagnosis not present

## 2022-12-30 DIAGNOSIS — R64 Cachexia: Secondary | ICD-10-CM | POA: Diagnosis present

## 2022-12-30 DIAGNOSIS — G40909 Epilepsy, unspecified, not intractable, without status epilepticus: Secondary | ICD-10-CM | POA: Diagnosis present

## 2022-12-30 DIAGNOSIS — K852 Alcohol induced acute pancreatitis without necrosis or infection: Secondary | ICD-10-CM | POA: Diagnosis not present

## 2022-12-30 DIAGNOSIS — I493 Ventricular premature depolarization: Secondary | ICD-10-CM | POA: Diagnosis present

## 2022-12-30 DIAGNOSIS — R112 Nausea with vomiting, unspecified: Secondary | ICD-10-CM | POA: Diagnosis present

## 2022-12-30 DIAGNOSIS — F1010 Alcohol abuse, uncomplicated: Secondary | ICD-10-CM | POA: Diagnosis present

## 2022-12-30 DIAGNOSIS — R0902 Hypoxemia: Secondary | ICD-10-CM | POA: Diagnosis not present

## 2022-12-30 DIAGNOSIS — J449 Chronic obstructive pulmonary disease, unspecified: Secondary | ICD-10-CM | POA: Diagnosis present

## 2022-12-30 DIAGNOSIS — Z79899 Other long term (current) drug therapy: Secondary | ICD-10-CM | POA: Diagnosis not present

## 2022-12-30 DIAGNOSIS — F17200 Nicotine dependence, unspecified, uncomplicated: Secondary | ICD-10-CM | POA: Diagnosis present

## 2022-12-30 DIAGNOSIS — I10 Essential (primary) hypertension: Secondary | ICD-10-CM | POA: Diagnosis present

## 2022-12-30 DIAGNOSIS — E43 Unspecified severe protein-calorie malnutrition: Secondary | ICD-10-CM | POA: Diagnosis present

## 2022-12-30 DIAGNOSIS — K860 Alcohol-induced chronic pancreatitis: Secondary | ICD-10-CM | POA: Diagnosis present

## 2022-12-30 DIAGNOSIS — Z886 Allergy status to analgesic agent status: Secondary | ICD-10-CM | POA: Diagnosis not present

## 2022-12-30 DIAGNOSIS — K7010 Alcoholic hepatitis without ascites: Secondary | ICD-10-CM | POA: Diagnosis present

## 2022-12-30 LAB — BASIC METABOLIC PANEL
Anion gap: 12 (ref 5–15)
BUN: 13 mg/dL (ref 8–23)
CO2: 18 mmol/L — ABNORMAL LOW (ref 22–32)
Calcium: 8.1 mg/dL — ABNORMAL LOW (ref 8.9–10.3)
Chloride: 108 mmol/L (ref 98–111)
Creatinine, Ser: 0.72 mg/dL (ref 0.61–1.24)
GFR, Estimated: >60 mL/min (ref >60)
Glucose, Bld: 80 mg/dL (ref 70–99)
Potassium: 3.1 mmol/L — ABNORMAL LOW (ref 3.5–5.1)
Sodium: 138 mmol/L (ref 135–145)

## 2022-12-30 LAB — CBC
HCT: 28 % — ABNORMAL LOW (ref 39.0–52.0)
Hemoglobin: 9.6 g/dL — ABNORMAL LOW (ref 13.0–17.0)
MCH: 29.6 pg (ref 26.0–34.0)
MCHC: 34.3 g/dL (ref 30.0–36.0)
MCV: 86.4 fL (ref 80.0–100.0)
Platelets: 169 10*3/uL (ref 150–400)
RBC: 3.24 MIL/uL — ABNORMAL LOW (ref 4.22–5.81)
RDW: 13.2 % (ref 11.5–15.5)
WBC: 6.5 10*3/uL (ref 4.0–10.5)
nRBC: 0 % (ref 0.0–0.2)

## 2022-12-30 LAB — LIPASE, BLOOD: Lipase: 142 U/L — ABNORMAL HIGH (ref 11–51)

## 2022-12-30 LAB — MAGNESIUM: Magnesium: 1.5 mg/dL — ABNORMAL LOW (ref 1.7–2.4)

## 2022-12-30 MED ORDER — MORPHINE SULFATE (PF) 2 MG/ML IV SOLN
2.0000 mg | INTRAVENOUS | Status: DC | PRN
  Administered 2022-12-30 – 2022-12-31 (×4): 2 mg via INTRAVENOUS
  Filled 2022-12-30 (×4): qty 1

## 2022-12-30 MED ORDER — NICOTINE 7 MG/24HR TD PT24
7.0000 mg | MEDICATED_PATCH | Freq: Once | TRANSDERMAL | Status: AC
  Administered 2022-12-30: 7 mg via TRANSDERMAL
  Filled 2022-12-30 (×2): qty 1

## 2022-12-30 MED ORDER — ZONISAMIDE 100 MG PO CAPS
100.0000 mg | ORAL_CAPSULE | Freq: Two times a day (BID) | ORAL | Status: DC
  Administered 2022-12-30 – 2023-01-05 (×13): 100 mg via ORAL
  Filled 2022-12-30 (×13): qty 1

## 2022-12-30 MED ORDER — POTASSIUM CHLORIDE 10 MEQ/100ML IV SOLN
10.0000 meq | Freq: Once | INTRAVENOUS | Status: AC
  Administered 2022-12-30: 10 meq via INTRAVENOUS
  Filled 2022-12-30: qty 100

## 2022-12-30 MED ORDER — SODIUM CHLORIDE 0.9 % IV BOLUS
1000.0000 mL | Freq: Once | INTRAVENOUS | Status: AC
  Administered 2022-12-30: 1000 mL via INTRAVENOUS

## 2022-12-30 MED ORDER — POTASSIUM CHLORIDE 10 MEQ/100ML IV SOLN
10.0000 meq | INTRAVENOUS | Status: AC
  Administered 2022-12-30 (×4): 10 meq via INTRAVENOUS
  Filled 2022-12-30 (×4): qty 100

## 2022-12-30 MED ORDER — MAGNESIUM OXIDE -MG SUPPLEMENT 400 (240 MG) MG PO TABS
400.0000 mg | ORAL_TABLET | Freq: Two times a day (BID) | ORAL | Status: AC
  Administered 2022-12-30 (×2): 400 mg via ORAL
  Filled 2022-12-30 (×2): qty 1

## 2022-12-30 NOTE — Plan of Care (Signed)

## 2022-12-30 NOTE — Plan of Care (Signed)
°  Problem: Clinical Measurements: °Goal: Ability to maintain clinical measurements within normal limits will improve °Outcome: Progressing °Goal: Will remain free from infection °Outcome: Progressing °Goal: Cardiovascular complication will be avoided °Outcome: Progressing °  °Problem: Pain Managment: °Goal: General experience of comfort will improve °Outcome: Progressing °  °Problem: Safety: °Goal: Ability to remain free from injury will improve °Outcome: Progressing °  °Problem: Skin Integrity: °Goal: Risk for impaired skin integrity will decrease °Outcome: Progressing °  °

## 2022-12-30 NOTE — Progress Notes (Signed)
[facility] PROGRESS NOTE    Progress Note  Lonnie Blair  ZOX:[id number] DOB: [birth year] DOA: 12/29/2022 PCP: Lonnie Providence, FNP     Brief Narrative:   Lonnie Blair is an 72 y.o. male past medical significant for seizure disorder, alcohol use as well as alcohol abuse comes into the emergency room with epigastric pain radiation to the back nausea and vomiting that started on the day of admission.  With his last drink was prior to admission.   Assessment/Plan:   Acute alcoholic pancreatitis Lipase elevated with abdominal pain nausea and vomiting. He was placed n.p.o. IV fluids and narcotics.  Seizure disorder (HCC) Continue Zonegran.  Hypokalemia: Check a mag, replete potassium IV.  Severe protein caloric malnutrition: Will need Ensure 3 times daily.   DVT prophylaxis: lovenox Family Communication:none Status is: Observation The patient remains OBS appropriate and will d/c before 2 midnights.    Code Status:     Code Status Orders  (From admission, onward)           Start     Ordered   12/29/22 1706  Full code  Continuous       Question:  By:  Answer:  Consent: discussion documented in EHR   12/29/22 1706           Code Status History     This patient has a current code status but no historical code status.         IV Access:   Peripheral IV   Procedures and diagnostic studies:   CT ABDOMEN PELVIS W CONTRAST  Result Date: 12/29/2022 CLINICAL DATA:  Abdominal pain, acute, nonlocalized EXAM: CT ABDOMEN AND PELVIS WITH CONTRAST TECHNIQUE: Multidetector CT imaging of the abdomen and pelvis was performed using the standard protocol following bolus administration of intravenous contrast. RADIATION DOSE REDUCTION: This exam was performed according to the departmental dose-optimization program which includes automated exposure control, adjustment of the mA and/or kV according to patient size and/or use of iterative reconstruction  technique. CONTRAST:  OMNIPAQUE IOHEXOL 300 MG/ML  SOLN COMPARISON:  CT scan chest from 04/25/2021 and CT scan abdomen and pelvis from 01/10/2013. FINDINGS: Lower chest: The lung bases are clear. Scattered emphysematous blebs noted in the imaged lung bases. No pleural effusion. The heart is normal in size. No pericardial effusion. Hepatobiliary: The liver is normal in size. Non-cirrhotic configuration. No suspicious mass. These is mild diffuse hepatic steatosis. No intrahepatic or extrahepatic bile duct dilation. No calcified gallstones. Normal gallbladder wall thickness. No pericholecystic inflammatory changes. Pancreas: No focal pancreatic lesion. Main pancreatic duct is dilated measuring up to 6-7 mm; however, no obstructing mass seen. There are scattered sub 5 mm foci of calcifications in the pancreas, nonspecific but can be seen with sequela of chronic pancreatitis. No peripancreatic fat stranding. Spleen: Within normal limits. No focal lesion. Adrenals/Urinary Tract: Adrenal glands are unremarkable. No suspicious renal mass. No hydronephrosis. No renal or ureteric calculi. Unremarkable urinary bladder. Stomach/Bowel: No disproportionate dilation of the small or large bowel loops. No evidence of abnormal bowel wall thickening or inflammatory changes. The appendix was not confidently visualized; however there is no acute inflammatory process in the right lower quadrant. Vascular/Lymphatic: No ascites or pneumoperitoneum. No abdominal or pelvic lymphadenopathy, by size criteria. No aneurysmal dilation of the major abdominal arteries. There are marked peripheral atherosclerotic vascular calcifications of the aorta and its major branches. Reproductive: Normal size prostate. Symmetric seminal vesicles. Other: The visualized soft tissues and abdominal wall are unremarkable. Musculoskeletal: No suspicious  osseous lesions. There are mild multilevel degenerative changes in the visualized spine. IMPRESSION: 1. No  acute inflammatory process identified within the abdomen or pelvis. 2. Multiple other nonacute observations, as described above. Aortic Atherosclerosis (ICD10-I70.0) and Emphysema (ICD10-J43.9). Electronically Signed   By: Jules Schick M.D.   On: 12/29/2022 14:02     Medical Consultants:   None.   Subjective:    Lonnie Blair relates he still has abdominal pain no appetite.  Objective:    Vitals:   12/29/22 1713 12/29/22 1937 12/29/22 2143 12/30/22 0429  BP: (!) 143/83  135/84 132/82  Pulse: 86  93 93  Resp: 17  17 18   Temp: (!) 97.5 F (36.4 C)  98 F (36.7 C) 97.9 F (36.6 C)  TempSrc: Oral     SpO2: 100%  100% 100%  Weight:  48 kg    Height:  5\' 6"  (1.676 m)     SpO2: 100 %   Intake/Output Summary (Last 24 hours) at 12/30/2022 0825 Last data filed at 12/30/2022 0430 Gross per 24 hour  Intake 1820.84 ml  Output 300 ml  Net 1520.84 ml   Filed Weights   12/29/22 1937  Weight: 48 kg    Exam: General exam: In no acute distress, cachectic Respiratory system: Good air movement and clear to auscultation. Cardiovascular system: S1 & S2 heard, RRR. No JVD. Gastrointestinal system: Abdomen is nondistended, soft and nontender.  Extremities: No pedal edema. Skin: No rashes, lesions or ulcers Psychiatry: Judgement and insight appear normal. Mood & affect appropriate.    Data Reviewed:    Labs: Basic Metabolic Panel: Recent Labs  Lab 12/29/22 1142 12/30/22 0558  NA 137 138  K 3.9 3.1*  CL 103 108  CO2 21* 18*  GLUCOSE 121* 80  BUN 24* 13  CREATININE 0.93 0.72  CALCIUM 9.0 8.1*   GFR Estimated Creatinine Clearance: 56.7 mL/min (by C-G formula based on SCr of 0.72 mg/dL). Liver Function Tests: Recent Labs  Lab 12/29/22 1142  AST 187*  ALT 121*  ALKPHOS 124  BILITOT 0.6  PROT 7.7  ALBUMIN 3.4*   Recent Labs  Lab 12/29/22 1142 12/30/22 0558  LIPASE 335* 142*   No results for input(s): "AMMONIA" in the last 168 hours. Coagulation profile No  results for input(s): "INR", "PROTIME" in the last 168 hours. COVID-19 Labs  No results for input(s): "DDIMER", "FERRITIN", "LDH", "CRP" in the last 72 hours.  No results found for: "SARSCOV2NAA"  CBC: Recent Labs  Lab 12/29/22 1142 12/30/22 0558  WBC 9.5 6.5  NEUTROABS 7.9*  --   HGB 11.1* 9.6*  HCT 32.6* 28.0*  MCV 85.1 86.4  PLT 163 169   Cardiac Enzymes: No results for input(s): "CKTOTAL", "CKMB", "CKMBINDEX", "TROPONINI" in the last 168 hours. BNP (last 3 results) No results for input(s): "PROBNP" in the last 8760 hours. CBG: No results for input(s): "GLUCAP" in the last 168 hours. D-Dimer: No results for input(s): "DDIMER" in the last 72 hours. Hgb A1c: No results for input(s): "HGBA1C" in the last 72 hours. Lipid Profile: No results for input(s): "CHOL", "HDL", "LDLCALC", "TRIG", "CHOLHDL", "LDLDIRECT" in the last 72 hours. Thyroid function studies: No results for input(s): "TSH", "T4TOTAL", "T3FREE", "THYROIDAB" in the last 72 hours.  Invalid input(s): "FREET3" Anemia work up: No results for input(s): "VITAMINB12", "FOLATE", "FERRITIN", "TIBC", "IRON", "RETICCTPCT" in the last 72 hours. Sepsis Labs: Recent Labs  Lab 12/29/22 1142 12/29/22 1300 12/29/22 1742 12/30/22 0558  WBC 9.5  --   --  6.5  LATICACIDVEN  --  2.3* 1.1  --    Microbiology No results found for this or any previous visit (from the past 240 hour(s)).   Medications:    enoxaparin (LOVENOX) injection  40 mg Subcutaneous Q24H   Continuous Infusions:  sodium chloride 100 mL/hr at 12/30/22 0151      LOS: 0 days   Marinda Elk  [facility]  12/30/2022, 8:25 AM

## 2022-12-31 DIAGNOSIS — K852 Alcohol induced acute pancreatitis without necrosis or infection: Secondary | ICD-10-CM | POA: Diagnosis not present

## 2022-12-31 DIAGNOSIS — E876 Hypokalemia: Secondary | ICD-10-CM | POA: Diagnosis not present

## 2022-12-31 LAB — GLUCOSE, CAPILLARY: Glucose-Capillary: 198 mg/dL — ABNORMAL HIGH (ref 70–99)

## 2022-12-31 LAB — BASIC METABOLIC PANEL
Anion gap: 13 (ref 5–15)
BUN: 9 mg/dL (ref 8–23)
CO2: 13 mmol/L — ABNORMAL LOW (ref 22–32)
Calcium: 8.1 mg/dL — ABNORMAL LOW (ref 8.9–10.3)
Chloride: 106 mmol/L (ref 98–111)
Creatinine, Ser: 0.73 mg/dL (ref 0.61–1.24)
GFR, Estimated: >60 mL/min (ref >60)
Glucose, Bld: 38 mg/dL — CL (ref 70–99)
Potassium: 3.8 mmol/L (ref 3.5–5.1)
Sodium: 132 mmol/L — ABNORMAL LOW (ref 135–145)

## 2022-12-31 MED ORDER — DIPHENHYDRAMINE HCL 25 MG PO CAPS
25.0000 mg | ORAL_CAPSULE | Freq: Once | ORAL | Status: AC | PRN
  Administered 2022-12-31: 25 mg via ORAL
  Filled 2022-12-31: qty 1

## 2022-12-31 MED ORDER — DEXTROSE 50 % IV SOLN
INTRAVENOUS | Status: AC
  Filled 2022-12-31: qty 50

## 2022-12-31 MED ORDER — DEXTROSE 5 % IV SOLN: INTRAVENOUS | Status: AC

## 2022-12-31 MED ORDER — DEXTROSE 50 % IV SOLN
1.0000 | Freq: Once | INTRAVENOUS | Status: AC
  Administered 2022-12-31: 50 mL via INTRAVENOUS

## 2022-12-31 NOTE — Progress Notes (Signed)
[facility] PROGRESS NOTE    Progress Note  Lonnie Blair  UJW:[id number] DOB: 02-May-1951 DOA: 12/29/2022 PCP: Diamantina Providence, FNP     Brief Narrative:   Lonnie Blair is an 72 y.o. male past medical significant for seizure disorder, alcohol use as well as alcohol abuse comes into the emergency room with epigastric pain radiation to the back nausea and vomiting that started on the day of admission.  With his last drink was prior to admission.   Assessment/Plan:   Acute alcoholic pancreatitis Still having abdominal pain. Continue to keep the patient n.p.o., continue narcotics for analgesics. Continue IV fluids monitor strict I's and O's and daily weights.  Seizure disorder (HCC) Continue Zonegran.  Hypokalemia: Repleted now in proved magnesium was low was also repleted.  Severe protein caloric malnutrition: Will need Ensure 3 times daily once able to take orals.   DVT prophylaxis: lovenox Family Communication:none Status is: Observation The patient remains OBS appropriate and will d/c before 2 midnights.    Code Status:     Code Status Orders  (From admission, onward)           Start     Ordered   12/29/22 1706  Full code  Continuous       Question:  By:  Answer:  Consent: discussion documented in EHR   12/29/22 1706           Code Status History     This patient has a current code status but no historical code status.         IV Access:   Peripheral IV   Procedures and diagnostic studies:   CT ABDOMEN PELVIS W CONTRAST  Result Date: 12/29/2022 CLINICAL DATA:  Abdominal pain, acute, nonlocalized EXAM: CT ABDOMEN AND PELVIS WITH CONTRAST TECHNIQUE: Multidetector CT imaging of the abdomen and pelvis was performed using the standard protocol following bolus administration of intravenous contrast. RADIATION DOSE REDUCTION: This exam was performed according to the departmental dose-optimization program which includes automated exposure  control, adjustment of the mA and/or kV according to patient size and/or use of iterative reconstruction technique. CONTRAST:  OMNIPAQUE IOHEXOL 300 MG/ML  SOLN COMPARISON:  CT scan chest from 04/25/2021 and CT scan abdomen and pelvis from 01/10/2013. FINDINGS: Lower chest: The lung bases are clear. Scattered emphysematous blebs noted in the imaged lung bases. No pleural effusion. The heart is normal in size. No pericardial effusion. Hepatobiliary: The liver is normal in size. Non-cirrhotic configuration. No suspicious mass. These is mild diffuse hepatic steatosis. No intrahepatic or extrahepatic bile duct dilation. No calcified gallstones. Normal gallbladder wall thickness. No pericholecystic inflammatory changes. Pancreas: No focal pancreatic lesion. Main pancreatic duct is dilated measuring up to 6-7 mm; however, no obstructing mass seen. There are scattered sub 5 mm foci of calcifications in the pancreas, nonspecific but can be seen with sequela of chronic pancreatitis. No peripancreatic fat stranding. Spleen: Within normal limits. No focal lesion. Adrenals/Urinary Tract: Adrenal glands are unremarkable. No suspicious renal mass. No hydronephrosis. No renal or ureteric calculi. Unremarkable urinary bladder. Stomach/Bowel: No disproportionate dilation of the small or large bowel loops. No evidence of abnormal bowel wall thickening or inflammatory changes. The appendix was not confidently visualized; however there is no acute inflammatory process in the right lower quadrant. Vascular/Lymphatic: No ascites or pneumoperitoneum. No abdominal or pelvic lymphadenopathy, by size criteria. No aneurysmal dilation of the major abdominal arteries. There are marked peripheral atherosclerotic vascular calcifications of the aorta and its major branches. Reproductive: Normal  size prostate. Symmetric seminal vesicles. Other: The visualized soft tissues and abdominal wall are unremarkable. Musculoskeletal: No suspicious  osseous lesions. There are mild multilevel degenerative changes in the visualized spine. IMPRESSION: 1. No acute inflammatory process identified within the abdomen or pelvis. 2. Multiple other nonacute observations, as described above. Aortic Atherosclerosis (ICD10-I70.0) and Emphysema (ICD10-J43.9). Electronically Signed   By: Jules Schick M.D.   On: 12/29/2022 14:02     Medical Consultants:   None.   Subjective:    Lonnie Blair has no labs right still in pain.  Objective:    Vitals:   12/30/22 1438 12/30/22 1921 12/31/22 0548 12/31/22 0807  BP: 136/82 (!) 140/81 129/79 131/83  Pulse: 83 95 95 98  Resp: 18 16 16  (!) 24  Temp: 98 F (36.7 C) 99.1 F (37.3 C) 98.5 F (36.9 C) 97.8 F (36.6 C)  TempSrc: Oral Oral Oral Oral  SpO2: 100% 100% 100% 100%  Weight:      Height:       SpO2: 100 %   Intake/Output Summary (Last 24 hours) at 12/31/2022 0950 Last data filed at 12/31/2022 0600 Gross per 24 hour  Intake 800 ml  Output --  Net 800 ml    Filed Weights   12/29/22 1937  Weight: 48 kg    Exam: General exam: In no acute distress. Respiratory system: Good air movement and clear to auscultation. Cardiovascular system: S1 & S2 heard, RRR. No JVD. Extremities: No pedal edema. Skin: No rashes, lesions or ulcers Psychiatry: Judgement and insight appear normal. Mood & affect appropriate.  Data Reviewed:    Labs: Basic Metabolic Panel: Recent Labs  Lab 12/29/22 1142 12/30/22 0558 12/30/22 0840 12/31/22 0556  NA 137 138  --  132*  K 3.9 3.1*  --  3.8  CL 103 108  --  106  CO2 21* 18*  --  13*  GLUCOSE 121* 80  --  38*  BUN 24* 13  --  9  CREATININE 0.93 0.72  --  0.73  CALCIUM 9.0 8.1*  --  8.1*  MG  --   --  1.5*  --     GFR Estimated Creatinine Clearance: 56.7 mL/min (by C-G formula based on SCr of 0.73 mg/dL). Liver Function Tests: Recent Labs  Lab 12/29/22 1142  AST 187*  ALT 121*  ALKPHOS 124  BILITOT 0.6  PROT 7.7  ALBUMIN 3.4*     Recent Labs  Lab 12/29/22 1142 12/30/22 0558  LIPASE 335* 142*    No results for input(s): "AMMONIA" in the last 168 hours. Coagulation profile No results for input(s): "INR", "PROTIME" in the last 168 hours. COVID-19 Labs  No results for input(s): "DDIMER", "FERRITIN", "LDH", "CRP" in the last 72 hours.  No results found for: "SARSCOV2NAA"  CBC: Recent Labs  Lab 12/29/22 1142 12/30/22 0558  WBC 9.5 6.5  NEUTROABS 7.9*  --   HGB 11.1* 9.6*  HCT 32.6* 28.0*  MCV 85.1 86.4  PLT 163 169    Cardiac Enzymes: No results for input(s): "CKTOTAL", "CKMB", "CKMBINDEX", "TROPONINI" in the last 168 hours. BNP (last 3 results) No results for input(s): "PROBNP" in the last 8760 hours. CBG: Recent Labs  Lab 12/31/22 0756  GLUCAP 198*   D-Dimer: No results for input(s): "DDIMER" in the last 72 hours. Hgb A1c: No results for input(s): "HGBA1C" in the last 72 hours. Lipid Profile: No results for input(s): "CHOL", "HDL", "LDLCALC", "TRIG", "CHOLHDL", "LDLDIRECT" in the last 72 hours. Thyroid function studies:  No results for input(s): "TSH", "T4TOTAL", "T3FREE", "THYROIDAB" in the last 72 hours.  Invalid input(s): "FREET3" Anemia work up: No results for input(s): "VITAMINB12", "FOLATE", "FERRITIN", "TIBC", "IRON", "RETICCTPCT" in the last 72 hours. Sepsis Labs: Recent Labs  Lab 12/29/22 1142 12/29/22 1300 12/29/22 1742 12/30/22 0558  WBC 9.5  --   --  6.5  LATICACIDVEN  --  2.3* 1.1  --     Microbiology No results found for this or any previous visit (from the past 240 hour(s)).   Medications:    enoxaparin (LOVENOX) injection  40 mg Subcutaneous Q24H   nicotine  7 mg Transdermal Once   zonisamide  100 mg Oral BID   Continuous Infusions:  sodium chloride 100 mL/hr at 12/31/22 0805   dextrose 50 mL/hr at 12/31/22 0801      LOS: 1 day   Marinda Elk  [facility]  12/31/2022, 9:50 AM

## 2023-01-01 ENCOUNTER — Inpatient Hospital Stay (HOSPITAL_COMMUNITY): Payer: Medicare PPO

## 2023-01-01 DIAGNOSIS — K852 Alcohol induced acute pancreatitis without necrosis or infection: Secondary | ICD-10-CM | POA: Diagnosis not present

## 2023-01-01 LAB — MAGNESIUM: Magnesium: 1.6 mg/dL — ABNORMAL LOW (ref 1.7–2.4)

## 2023-01-01 LAB — BRAIN NATRIURETIC PEPTIDE: B Natriuretic Peptide: 47.8 pg/mL (ref 0.0–100.0)

## 2023-01-01 MED ORDER — BUDESONIDE 0.25 MG/2ML IN SUSP
0.2500 mg | Freq: Two times a day (BID) | RESPIRATORY_TRACT | Status: DC
  Administered 2023-01-01 – 2023-01-05 (×8): 0.25 mg via RESPIRATORY_TRACT
  Filled 2023-01-01 (×8): qty 2

## 2023-01-01 MED ORDER — GUAIFENESIN ER 600 MG PO TB12
600.0000 mg | ORAL_TABLET | Freq: Two times a day (BID) | ORAL | Status: DC
  Administered 2023-01-01 – 2023-01-02 (×2): 600 mg via ORAL
  Filled 2023-01-01 (×2): qty 1

## 2023-01-01 MED ORDER — IPRATROPIUM-ALBUTEROL 0.5-2.5 (3) MG/3ML IN SOLN
3.0000 mL | RESPIRATORY_TRACT | Status: DC | PRN
  Administered 2023-01-01: 3 mL via RESPIRATORY_TRACT
  Filled 2023-01-01: qty 3

## 2023-01-01 MED ORDER — MAGNESIUM OXIDE -MG SUPPLEMENT 400 (240 MG) MG PO TABS
400.0000 mg | ORAL_TABLET | Freq: Two times a day (BID) | ORAL | Status: AC
  Administered 2023-01-01 – 2023-01-02 (×2): 400 mg via ORAL
  Filled 2023-01-01 (×2): qty 1

## 2023-01-01 MED ORDER — IPRATROPIUM-ALBUTEROL 0.5-2.5 (3) MG/3ML IN SOLN
3.0000 mL | Freq: Four times a day (QID) | RESPIRATORY_TRACT | Status: DC
  Administered 2023-01-01 (×2): 3 mL via RESPIRATORY_TRACT
  Filled 2023-01-01 (×2): qty 3

## 2023-01-01 MED ORDER — IPRATROPIUM-ALBUTEROL 0.5-2.5 (3) MG/3ML IN SOLN: 3.0000 mL | Freq: Three times a day (TID) | RESPIRATORY_TRACT | Status: DC

## 2023-01-01 NOTE — Progress Notes (Signed)
PN, Follow up/   Patient report Dyspnea, started today. He has history of COPD and uses nebulizer at home. Denies cough after eating.  General; Mild distress.  Lung Tachypnea, RR 30. BL decreased breath sound, Crackles.   -1-Dyspnea;  History of COPD> start schedule nebulizer.  No wheezing. Hold on steroids.  Check Chest x ray Check BNP.  Place on 2l Oxygen for now.  NSL fluids.  Dr Radonna Ricker to follow

## 2023-01-01 NOTE — Progress Notes (Signed)
[facility] PROGRESS NOTE    Progress Note  Lonnie Blair  WUJ:[id number] DOB: 1951-04-06 DOA: 12/29/2022 PCP: Diamantina Providence, FNP     Brief Narrative:   Lonnie Blair is an 72 y.o. male past medical significant for seizure disorder, alcohol use as well as alcohol abuse comes into the emergency room with epigastric pain radiation to the back nausea and vomiting that started on the day of admission.  With his last drink was prior to admission.   Assessment/Plan:   Acute alcoholic pancreatitis Pain is improved/really well. He would like to try diet start him on a clear liquid diet advance as tolerated. DC narcotics. KVO IV fluids.  Seizure disorder (HCC) Continue Zonegran.  Hypokalemia: Repleted now in proved magnesium was low was also repleted.  Severe protein caloric malnutrition: Will need Ensure 3 times daily once able to take orals.   DVT prophylaxis: lovenox Family Communication:none Status is: Observation The patient remains OBS appropriate and will d/c before 2 midnights.    Code Status:     Code Status Orders  (From admission, onward)           Start     Ordered   12/29/22 1706  Full code  Continuous       Question:  By:  Answer:  Consent: discussion documented in EHR   12/29/22 1706           Code Status History     This patient has a current code status but no historical code status.         IV Access:   Peripheral IV   Procedures and diagnostic studies:   No results found.   Medical Consultants:   None.   Subjective:    Lonnie Blair abdominal pain is improved.  Objective:    Vitals:   12/31/22 0807 12/31/22 1919 01/01/23 0459 01/01/23 1026  BP: 131/83 136/83 124/83 (!) 141/89  Pulse: 98 100 96 92  Resp: (!) 24 15 16  (!) 22  Temp: 97.8 F (36.6 C) 98.2 F (36.8 C) 98.1 F (36.7 C) 98.4 F (36.9 C)  TempSrc: Oral Oral Oral Oral  SpO2: 100% 99% 99% 98%  Weight:      Height:       SpO2: 98  %   Intake/Output Summary (Last 24 hours) at 01/01/2023 1030 Last data filed at 01/01/2023 9562 Gross per 24 hour  Intake 2823.79 ml  Output --  Net 2823.79 ml   Filed Weights   12/29/22 1937  Weight: 48 kg    Exam: General exam: In no acute distress. Respiratory system: Good air movement and clear to auscultation. Cardiovascular system: S1 & S2 heard, RRR. No JVD. Gastrointestinal system: Abdomen is nondistended, soft and nontender.  Extremities: No pedal edema. Skin: No rashes, lesions or ulcers Psychiatry: Judgement and insight appear normal. Mood & affect appropriate.  Data Reviewed:    Labs: Basic Metabolic Panel: Recent Labs  Lab 12/29/22 1142 12/30/22 0558 12/30/22 0840 12/31/22 0556  NA 137 138  --  132*  K 3.9 3.1*  --  3.8  CL 103 108  --  106  CO2 21* 18*  --  13*  GLUCOSE 121* 80  --  38*  BUN 24* 13  --  9  CREATININE 0.93 0.72  --  0.73  CALCIUM 9.0 8.1*  --  8.1*  MG  --   --  1.5*  --    GFR Estimated Creatinine Clearance: 56.7 mL/min (by C-G formula based  on SCr of 0.73 mg/dL). Liver Function Tests: Recent Labs  Lab 12/29/22 1142  AST 187*  ALT 121*  ALKPHOS 124  BILITOT 0.6  PROT 7.7  ALBUMIN 3.4*   Recent Labs  Lab 12/29/22 1142 12/30/22 0558  LIPASE 335* 142*   No results for input(s): "AMMONIA" in the last 168 hours. Coagulation profile No results for input(s): "INR", "PROTIME" in the last 168 hours. COVID-19 Labs  No results for input(s): "DDIMER", "FERRITIN", "LDH", "CRP" in the last 72 hours.  No results found for: "SARSCOV2NAA"  CBC: Recent Labs  Lab 12/29/22 1142 12/30/22 0558  WBC 9.5 6.5  NEUTROABS 7.9*  --   HGB 11.1* 9.6*  HCT 32.6* 28.0*  MCV 85.1 86.4  PLT 163 169   Cardiac Enzymes: No results for input(s): "CKTOTAL", "CKMB", "CKMBINDEX", "TROPONINI" in the last 168 hours. BNP (last 3 results) No results for input(s): "PROBNP" in the last 8760 hours. CBG: Recent Labs  Lab 12/31/22 0756  GLUCAP  198*   D-Dimer: No results for input(s): "DDIMER" in the last 72 hours. Hgb A1c: No results for input(s): "HGBA1C" in the last 72 hours. Lipid Profile: No results for input(s): "CHOL", "HDL", "LDLCALC", "TRIG", "CHOLHDL", "LDLDIRECT" in the last 72 hours. Thyroid function studies: No results for input(s): "TSH", "T4TOTAL", "T3FREE", "THYROIDAB" in the last 72 hours.  Invalid input(s): "FREET3" Anemia work up: No results for input(s): "VITAMINB12", "FOLATE", "FERRITIN", "TIBC", "IRON", "RETICCTPCT" in the last 72 hours. Sepsis Labs: Recent Labs  Lab 12/29/22 1142 12/29/22 1300 12/29/22 1742 12/30/22 0558  WBC 9.5  --   --  6.5  LATICACIDVEN  --  2.3* 1.1  --    Microbiology No results found for this or any previous visit (from the past 240 hour(s)).   Medications:    enoxaparin (LOVENOX) injection  40 mg Subcutaneous Q24H   zonisamide  100 mg Oral BID   Continuous Infusions:  sodium chloride 100 mL/hr at 01/01/23 0240      LOS: 2 days   Lonnie Blair  [facility]  01/01/2023, 10:30 AM

## 2023-01-01 NOTE — TOC Initial Note (Signed)
Transition of Care [facility]) - Initial/Assessment Note    Patient Details  Name: Lonnie Blair MRN: [id number] Date of Birth: [birth year]  [facility]) CM/SW Contact:    Otelia Santee, LCSW Phone Number: 01/01/2023, 11:09 AM  Clinical Narrative:                 Met with pt and spouse at bedside. CSW offered resources for outpatient counseling and programs available to assist pt in cutting back on alcohol use. Pt agreeable to resources being placed. Pt's spouse also requests resources for tooth extractions and dentures. Resources placed in AVS.   Expected Discharge Plan: Home/Self Care Barriers to Discharge: No Barriers Identified   Patient Goals and CMS Choice Patient states their goals for this hospitalization and ongoing recovery are:: To return home CMS Medicare.gov Compare Post Acute Care list provided to:: Patient Choice offered to / list presented to : Patient, Spouse [place] ownership interest in [facility]provided to:: Patient    Expected Discharge Plan and Services In-house Referral: NA Discharge Planning Services: NA Post Acute Care Choice: NA Living arrangements for the past 2 months: Single Family Home                 DME Arranged: N/A DME Agency: NA                  Prior Living Arrangements/Services Living arrangements for the past 2 months: Single Family Home Lives with:: Spouse Patient language and need for interpreter reviewed:: Yes Do you feel safe going back to the place where you live?: Yes      Need for Family Participation in Patient Care: No (Comment) Care giver support system in place?: No (comment) Current home services: DME Criminal Activity/Legal Involvement Pertinent to Current Situation/Hospitalization: No - Comment as needed  Activities of Daily Living Home Assistive Devices/Equipment: None ADL Screening (condition at time of admission) Patient's cognitive ability adequate to safely complete daily  activities?: Yes Is the patient deaf or have difficulty hearing?: No Does the patient have difficulty seeing, even when wearing glasses/contacts?: No Does the patient have difficulty concentrating, remembering, or making decisions?: No Patient able to express need for assistance with ADLs?: Yes Does the patient have difficulty dressing or bathing?: No Independently performs ADLs?: Yes (appropriate for developmental age) Does the patient have difficulty walking or climbing stairs?: No Weakness of Legs: None Weakness of Arms/Hands: None  Permission Sought/Granted   Permission granted to share information with : Yes, Verbal Permission Granted  Share Information with NAME: Jefferson Fullam     Permission granted to share info w Relationship: Spouse  Permission granted to share info w Contact Information: [phone]  Emotional Assessment Appearance:: Appears stated age Attitude/Demeanor/Rapport: Engaged Affect (typically observed): Pleasant, Accepting Orientation: : Oriented to Self, Oriented to Place, Oriented to  Time, Oriented to Situation Alcohol / Substance Use: Alcohol Use Psych Involvement: No (comment)  Admission diagnosis:  Intractable vomiting with nausea [R11.2] Alcohol-induced acute pancreatitis without infection or necrosis [K85.20] Alcoholic pancreatitis [K85.20] Patient Active Problem List   Diagnosis Date Noted   Alcoholic pancreatitis 12/30/2022   Intractable vomiting with nausea 12/29/2022   Acute alcoholic pancreatitis 12/29/2022   Seizure disorder (HCC) 12/29/2022   PCP:  Diamantina Providence, FNP Pharmacy:   [id number] - [place], [place] - [address] [place] [address] Phone: [phone] Fax: [phone]     Social Determinants of Health (SDOH)  Social History: SDOH Screenings   Food Insecurity: No Food Insecurity (12/29/2022)  Housing: Low Risk  (12/29/2022)  Transportation Needs: No  Transportation Needs (12/29/2022)  Utilities: Not At Risk (12/29/2022)  Tobacco Use: High Risk (12/29/2022)   SDOH Interventions: Food Insecurity Interventions: Intervention Not Indicated Housing Interventions: Intervention Not Indicated Transportation Interventions: Intervention Not Indicated Utilities Interventions: Intervention Not Indicated   Readmission Risk Interventions    01/01/2023   11:08 AM  Readmission Risk Prevention Plan  Post Dischage Appt Complete  Medication Screening Complete  Transportation Screening Complete

## 2023-01-02 ENCOUNTER — Inpatient Hospital Stay (HOSPITAL_COMMUNITY): Payer: Medicare PPO

## 2023-01-02 DIAGNOSIS — E876 Hypokalemia: Secondary | ICD-10-CM | POA: Diagnosis not present

## 2023-01-02 DIAGNOSIS — K567 Ileus, unspecified: Secondary | ICD-10-CM | POA: Diagnosis not present

## 2023-01-02 DIAGNOSIS — K852 Alcohol induced acute pancreatitis without necrosis or infection: Secondary | ICD-10-CM | POA: Diagnosis not present

## 2023-01-02 MED ORDER — MORPHINE SULFATE (PF) 2 MG/ML IV SOLN
2.0000 mg | INTRAVENOUS | Status: DC | PRN
  Administered 2023-01-02 – 2023-01-03 (×2): 2 mg via INTRAVENOUS
  Filled 2023-01-02 (×3): qty 1

## 2023-01-02 MED ORDER — MAGNESIUM SULFATE 2 GM/50ML IV SOLN
2.0000 g | Freq: Once | INTRAVENOUS | Status: AC
  Administered 2023-01-02: 2 g via INTRAVENOUS
  Filled 2023-01-02: qty 50

## 2023-01-02 MED ORDER — KCL IN DEXTROSE-NACL 20-5-0.45 MEQ/L-%-% IV SOLN
INTRAVENOUS | Status: DC
  Filled 2023-01-02 (×2): qty 1000

## 2023-01-02 MED ORDER — MORPHINE SULFATE (PF) 2 MG/ML IV SOLN
1.0000 mg | Freq: Once | INTRAVENOUS | Status: AC
  Administered 2023-01-02: 1 mg via INTRAVENOUS
  Filled 2023-01-02: qty 1

## 2023-01-02 MED ORDER — POLYETHYLENE GLYCOL 3350 17 G PO PACK
17.0000 g | PACK | Freq: Two times a day (BID) | ORAL | Status: DC
  Administered 2023-01-02: 17 g via ORAL
  Filled 2023-01-02: qty 1

## 2023-01-02 MED ORDER — ALBUTEROL SULFATE (2.5 MG/3ML) 0.083% IN NEBU: 2.5000 mg | INHALATION_SOLUTION | Freq: Four times a day (QID) | RESPIRATORY_TRACT | Status: DC

## 2023-01-02 MED ORDER — DOCUSATE SODIUM 283 MG RE ENEM
1.0000 | ENEMA | Freq: Every day | RECTAL | Status: DC
  Administered 2023-01-02: 283 mg via RECTAL
  Filled 2023-01-02: qty 1

## 2023-01-02 MED ORDER — ALBUTEROL SULFATE HFA 108 (90 BASE) MCG/ACT IN AERS
2.0000 | INHALATION_SPRAY | Freq: Four times a day (QID) | RESPIRATORY_TRACT | Status: DC
  Filled 2023-01-02: qty 6.7

## 2023-01-02 MED ORDER — SIMETHICONE 80 MG PO CHEW
80.0000 mg | CHEWABLE_TABLET | Freq: Four times a day (QID) | ORAL | Status: DC | PRN
  Administered 2023-01-02: 80 mg via ORAL
  Filled 2023-01-02: qty 1

## 2023-01-02 MED ORDER — IPRATROPIUM-ALBUTEROL 0.5-2.5 (3) MG/3ML IN SOLN
3.0000 mL | Freq: Three times a day (TID) | RESPIRATORY_TRACT | Status: DC
  Filled 2023-01-02: qty 3

## 2023-01-02 MED ORDER — IPRATROPIUM-ALBUTEROL 0.5-2.5 (3) MG/3ML IN SOLN
3.0000 mL | Freq: Two times a day (BID) | RESPIRATORY_TRACT | Status: DC
  Administered 2023-01-02 – 2023-01-05 (×6): 3 mL via RESPIRATORY_TRACT
  Filled 2023-01-02 (×6): qty 3

## 2023-01-02 NOTE — Progress Notes (Addendum)
[facility] PROGRESS NOTE    Progress Note  Lonnie Blair  ZOX:[id number] DOB: 1950/10/31 DOA: 12/29/2022 PCP: Diamantina Providence, FNP     Brief Narrative:   Lonnie Blair is an 72 y.o. male past medical significant for seizure disorder, alcohol use as well as alcohol abuse comes into the emergency room with epigastric pain radiation to the back nausea and vomiting that started on the day of admission.  With his last drink was prior to admission.   Assessment/Plan:   Acute alcoholic pancreatitis He would like to try diet , but NPO due to Ileus. KVO IV fluids.  New Ileus: He has not had a bowel movement in almost a week. Abd x-ray showed ileus. Npo, Place NG tube to intermittent suction. Try to keep K > 4 and Mag > 2.  Seizure disorder (HCC) Continue Zonegran.  Hypokalemia: Repleted now improved magnesium was low was also repleted.  Severe protein caloric malnutrition: Will need Ensure 3 times daily once able to take orals.  Episode of hypoxia: Chest x-ray was done which showed a conspicuous apical irregular nodule, CT scan of the chest without contrast has been ordered. He was placed on 2 L of oxygen and started on inhalers and albuterol.   DVT prophylaxis: lovenox Family Communication:none Status is: Observation The patient remains OBS appropriate and will d/c before 2 midnights.    Code Status:     Code Status Orders  (From admission, onward)           Start     Ordered   12/29/22 1706  Full code  Continuous       Question:  By:  Answer:  Consent: discussion documented in EHR   12/29/22 1706           Code Status History     This patient has a current code status but no historical code status.         IV Access:   Peripheral IV   Procedures and diagnostic studies:   DG CHEST PORT 1 VIEW  Result Date: 01/01/2023 CLINICAL DATA:  [id number] Dyspnea [id number] EXAM: PORTABLE CHEST 1 VIEW COMPARISON:  December 26, 2020 FINDINGS: The  cardiomediastinal silhouette is unchanged in contour.Emphysematous changes. No pleural effusion. No pneumothorax. Increased conspicuity of a LEFT apical irregular nodule. No focal consolidation. IMPRESSION: Increased conspicuity of a LEFT apical irregular nodule. Recommend further evaluation with dedicated chest CT. Electronically Signed   By: Meda Klinefelter M.D.   On: 01/01/2023 16:16     Medical Consultants:   None.   Subjective:    Lonnie Blair abdominal pain is worse today has not had a bowel movement.  Objective:    Vitals:   01/01/23 2351 01/02/23 0416 01/02/23 0823 01/02/23 0839  BP: 121/81 128/88 134/84   Pulse: (!) 106 (!) 106 (!) 103   Resp: 15 16 16    Temp: 97.9 F (36.6 C) 97.9 F (36.6 C)    TempSrc: Oral Oral    SpO2: 100% 100% 100% 98%  Weight:      Height:       SpO2: 98 % O2 Flow Rate (L/min): (S) 3 L/min (down to 2 lpm post neb)   Intake/Output Summary (Last 24 hours) at 01/02/2023 0901 Last data filed at 01/02/2023 0548 Gross per 24 hour  Intake 785.67 ml  Output 250 ml  Net 535.67 ml   Filed Weights   12/29/22 1937  Weight: 48 kg    Exam: General exam: In no acute distress.  Respiratory system: Poor air movement and clear to auscultation Cardiovascular system: S1 & S2 heard, RRR. No JVD. Gastrointestinal system: Abdomen is distended no rebound or guarding, mild left lower quadrant tenderness Extremities: No pedal edema. Skin: No rashes, lesions or ulcers Psychiatry: Judgement and insight appear normal. Mood & affect appropriate.  Data Reviewed:    Labs: Basic Metabolic Panel: Recent Labs  Lab 12/29/22 1142 12/30/22 0558 12/30/22 0840 12/31/22 0556 01/01/23 1555  NA 137 138  --  132*  --   K 3.9 3.1*  --  3.8  --   CL 103 108  --  106  --   CO2 21* 18*  --  13*  --   GLUCOSE 121* 80  --  38*  --   BUN 24* 13  --  9  --   CREATININE 0.93 0.72  --  0.73  --   CALCIUM 9.0 8.1*  --  8.1*  --   MG  --   --  1.5*  --  1.6*    GFR Estimated Creatinine Clearance: 56.7 mL/min (by C-G formula based on SCr of 0.73 mg/dL). Liver Function Tests: Recent Labs  Lab 12/29/22 1142  AST 187*  ALT 121*  ALKPHOS 124  BILITOT 0.6  PROT 7.7  ALBUMIN 3.4*   Recent Labs  Lab 12/29/22 1142 12/30/22 0558  LIPASE 335* 142*   No results for input(s): "AMMONIA" in the last 168 hours. Coagulation profile No results for input(s): "INR", "PROTIME" in the last 168 hours. COVID-19 Labs  No results for input(s): "DDIMER", "FERRITIN", "LDH", "CRP" in the last 72 hours.  No results found for: "SARSCOV2NAA"  CBC: Recent Labs  Lab 12/29/22 1142 12/30/22 0558  WBC 9.5 6.5  NEUTROABS 7.9*  --   HGB 11.1* 9.6*  HCT 32.6* 28.0*  MCV 85.1 86.4  PLT 163 169   Cardiac Enzymes: No results for input(s): "CKTOTAL", "CKMB", "CKMBINDEX", "TROPONINI" in the last 168 hours. BNP (last 3 results) No results for input(s): "PROBNP" in the last 8760 hours. CBG: Recent Labs  Lab 12/31/22 0756  GLUCAP 198*   D-Dimer: No results for input(s): "DDIMER" in the last 72 hours. Hgb A1c: No results for input(s): "HGBA1C" in the last 72 hours. Lipid Profile: No results for input(s): "CHOL", "HDL", "LDLCALC", "TRIG", "CHOLHDL", "LDLDIRECT" in the last 72 hours. Thyroid function studies: No results for input(s): "TSH", "T4TOTAL", "T3FREE", "THYROIDAB" in the last 72 hours.  Invalid input(s): "FREET3" Anemia work up: No results for input(s): "VITAMINB12", "FOLATE", "FERRITIN", "TIBC", "IRON", "RETICCTPCT" in the last 72 hours. Sepsis Labs: Recent Labs  Lab 12/29/22 1142 12/29/22 1300 12/29/22 1742 12/30/22 0558  WBC 9.5  --   --  6.5  LATICACIDVEN  --  2.3* 1.1  --    Microbiology No results found for this or any previous visit (from the past 240 hour(s)).   Medications:    budesonide (PULMICORT) nebulizer solution  0.25 mg Nebulization BID   enoxaparin (LOVENOX) injection  40 mg Subcutaneous Q24H   guaiFENesin  600 mg  Oral BID   magnesium oxide  400 mg Oral BID   zonisamide  100 mg Oral BID   Continuous Infusions:  sodium chloride 10 mL/hr at 01/01/23 1557      LOS: 3 days   Marinda Elk  [facility]  01/02/2023, 9:01 AM

## 2023-01-02 NOTE — Progress Notes (Signed)
PT demonstrated hands on understanding of Flutter device. PC at this time. 

## 2023-01-02 NOTE — Plan of Care (Signed)

## 2023-01-03 DIAGNOSIS — E876 Hypokalemia: Secondary | ICD-10-CM | POA: Diagnosis not present

## 2023-01-03 DIAGNOSIS — K852 Alcohol induced acute pancreatitis without necrosis or infection: Secondary | ICD-10-CM | POA: Diagnosis not present

## 2023-01-03 DIAGNOSIS — K567 Ileus, unspecified: Secondary | ICD-10-CM | POA: Diagnosis not present

## 2023-01-03 LAB — CBC
HCT: 27.9 % — ABNORMAL LOW (ref 39.0–52.0)
Hemoglobin: 9.4 g/dL — ABNORMAL LOW (ref 13.0–17.0)
MCH: 29.9 pg (ref 26.0–34.0)
MCHC: 33.7 g/dL (ref 30.0–36.0)
MCV: 88.9 fL (ref 80.0–100.0)
Platelets: 179 10*3/uL (ref 150–400)
RBC: 3.14 MIL/uL — ABNORMAL LOW (ref 4.22–5.81)
RDW: 13.1 % (ref 11.5–15.5)
WBC: 5.8 10*3/uL (ref 4.0–10.5)
nRBC: 0 % (ref 0.0–0.2)

## 2023-01-03 LAB — BASIC METABOLIC PANEL
Anion gap: 9 (ref 5–15)
BUN: 7 mg/dL — ABNORMAL LOW (ref 8–23)
CO2: 26 mmol/L (ref 22–32)
Calcium: 8.6 mg/dL — ABNORMAL LOW (ref 8.9–10.3)
Chloride: 98 mmol/L (ref 98–111)
Creatinine, Ser: 0.73 mg/dL (ref 0.61–1.24)
GFR, Estimated: >60 mL/min (ref >60)
Glucose, Bld: 125 mg/dL — ABNORMAL HIGH (ref 70–99)
Potassium: 4 mmol/L (ref 3.5–5.1)
Sodium: 133 mmol/L — ABNORMAL LOW (ref 135–145)

## 2023-01-03 LAB — HEPATIC FUNCTION PANEL
ALT: 35 U/L (ref 0–44)
AST: 45 U/L — ABNORMAL HIGH (ref 15–41)
Albumin: 2.7 g/dL — ABNORMAL LOW (ref 3.5–5.0)
Alkaline Phosphatase: 64 U/L (ref 38–126)
Bilirubin, Direct: 0.2 mg/dL (ref 0.0–0.2)
Indirect Bilirubin: 0.4 mg/dL (ref 0.3–0.9)
Total Bilirubin: 0.6 mg/dL (ref 0.3–1.2)
Total Protein: 6.3 g/dL — ABNORMAL LOW (ref 6.5–8.1)

## 2023-01-03 LAB — MAGNESIUM: Magnesium: 2.2 mg/dL (ref 1.7–2.4)

## 2023-01-03 MED ORDER — KCL IN DEXTROSE-NACL 20-5-0.45 MEQ/L-%-% IV SOLN
INTRAVENOUS | Status: AC
  Filled 2023-01-03 (×2): qty 1000

## 2023-01-03 NOTE — Plan of Care (Signed)

## 2023-01-03 NOTE — Progress Notes (Signed)
Patient refusing to keep NG tube in, halfway pulled out. This nurse removed the NG tube. Patient and the family wants to speak with day time hospitalist first before another attempt.

## 2023-01-03 NOTE — Progress Notes (Addendum)
[facility] PROGRESS NOTE    Progress Note  Lonnie Blair  ZOX:[id number] DOB: 1951/01/24 DOA: 12/29/2022 PCP: Diamantina Providence, FNP     Brief Narrative:   Lonnie Blair is an 72 y.o. male past medical significant for seizure disorder, alcohol use as well as alcohol abuse comes into the emergency room with epigastric pain radiation to the back nausea and vomiting that started on the day of admission.  With his last drink was prior to admission.   Assessment/Plan:   Acute alcoholic pancreatitis He would like to try diet , but NPO due to Ileus. Continue IV fluids strict I's and O's and daily weights.  New Ileus: Patient removed NG tube, had about 1000 cc from NG tube. Abdominal x-rays pending this morning try to keep potassium greater than 4 magnesium greater than 2, out of bed to chair, minimize narcotics and ambulate. He relates he had a small bowel movement. He needs to be n.p.o. may have a little bit of ice chips.  Elevated LFTs: Likely due to alcohol abuse split 2-1, recheck today.  Hypomagnesemia: Repleted IV now improved.  Seizure disorder (HCC) Continue Zonegran.  Hypokalemia-hypomagnesemia: Repleted, try to keep potassium greater than 4 magnesium greater than 2.  Severe protein caloric malnutrition: Will need Ensure 3 times daily once able to take orals.  Episode of hypoxia: Chest x-ray was done which showed a conspicuous apical irregular nodule, CT scan of the chest without contrast has been ordered. He was placed on 2 L of oxygen and started on inhalers and albuterol.   DVT prophylaxis: lovenox Family Communication:none Status is: Observation The patient remains OBS appropriate and will d/c before 2 midnights.    Code Status:     Code Status Orders  (From admission, onward)           Start     Ordered   12/29/22 1706  Full code  Continuous       Question:  By:  Answer:  Consent: discussion documented in EHR   12/29/22 1706            Code Status History     This patient has a current code status but no historical code status.         IV Access:   Peripheral IV   Procedures and diagnostic studies:   DG Abd Portable 1V  Result Date: 01/02/2023 CLINICAL DATA:  NG tube placement EXAM: PORTABLE ABDOMEN - 1 VIEW COMPARISON:  01/02/2023 FINDINGS: Limited field of view for tube placement verification. An enteric tube is present with tip in the upper abdomen at the midline consistent with location in the distal stomach. Persistent gaseous distention of small bowel could indicate ileus or obstruction. No change. No radiopaque stones. Vascular calcifications. Degenerative changes in the spine and hips. Lung bases are clear. IMPRESSION: Enteric tube tip projects over the upper abdomen at the midline consistent with location in the distal stomach. Persistent gaseous distention of small bowel. Electronically Signed   By: Burman Nieves M.D.   On: 01/02/2023 15:19   DG Abd 1 View  Result Date: 01/02/2023 CLINICAL DATA:  Abdominal pain EXAM: ABDOMEN - 1 VIEW COMPARISON:  CT abdomen and pelvis dated 12/29/2022 FINDINGS: Mildly distended gas-filled loops of small bowel throughout the abdomen and pelvis. Air is present within the rectosigmoid colon. No evidence of soft tissue mass or abnormal fluid collection. No evidence of free intraperitoneal air. No evidence of renal or ureteral calculi. Visualized osseous structures about the abdomen and pelvis  are unremarkable. IMPRESSION: Mildly distended gas-filled loops of small bowel throughout the abdomen and pelvis, most likely ileus but possibly a distal partial small bowel obstruction. Electronically Signed   By: Bary Richard M.D.   On: 01/02/2023 13:58   CT CHEST WO CONTRAST  Result Date: 01/02/2023 CLINICAL DATA:  72 year old male with history of pulmonary nodule. Followup study. EXAM: CT CHEST WITHOUT CONTRAST TECHNIQUE: Multidetector CT imaging of the chest was performed  following the standard protocol without IV contrast. RADIATION DOSE REDUCTION: This exam was performed according to the departmental dose-optimization program which includes automated exposure control, adjustment of the mA and/or kV according to patient size and/or use of iterative reconstruction technique. COMPARISON:  Chest x-ray 01/01/2023.  Chest CT 04/25/2021. FINDINGS: Cardiovascular: Heart size is normal. There is no significant pericardial fluid, thickening or pericardial calcification. There is aortic atherosclerosis, as well as atherosclerosis of the great vessels of the mediastinum and the coronary arteries, including calcified atherosclerotic plaque in the left main, left anterior descending, left circumflex and right coronary arteries. Mediastinum/Nodes: No pathologically enlarged mediastinal or hilar lymph nodes. Please note that accurate exclusion of hilar adenopathy is limited on noncontrast CT scans. Esophagus is unremarkable in appearance. No axillary lymphadenopathy. Lungs/Pleura: In the anterior aspect of the left upper lobe (axial image 34 of series 6) there is a 1.0 x 0.7 cm pulmonary nodule with irregular slightly spiculated margins. 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 There also some nodular areas of architectural distortion in the extreme lung bases, in association with bronchial wall thickening, thickening of the peribronchovascular interstitium and regional areas of volume loss, favored to be of infectious or inflammatory etiology. Small bilateral pleural effusions lying dependently. Diffuse bronchial wall thickening with moderate centrilobular and paraseptal emphysema. Upper Abdomen: Aortic atherosclerosis. Musculoskeletal: There are no aggressive appearing lytic  or blastic lesions noted in the visualized portions of the skeleton. IMPRESSION: 1. There is at least one suspicious appearing pulmonary nodule in the anterior aspect of the left upper lobe (axial image 35 of series 6) measuring 1.0 x 0.7 cm on today's study. Several other pulmonary nodules are noted elsewhere in the lungs bilaterally, and are favored to be areas of chronic scarring and/or of infectious or inflammatory etiology. Short-term follow-up repeat noncontrast chest CT is recommended in 1 month following trial of antimicrobial therapy to re-evaluate these lesions. Should this left upper lobe pulmonary nodule persist or increase in size, further evaluation with PET-CT would be recommended. 2. Small bilateral pleural effusions. 3. Diffuse bronchial wall thickening with moderate centrilobular and paraseptal emphysema; imaging findings suggestive of underlying COPD. 4. Aortic atherosclerosis, in addition to left main and three-vessel coronary artery disease. Please note that although the presence of coronary artery calcium documents the presence of coronary artery disease, the severity of this disease and any potential stenosis cannot be assessed on this non-gated CT examination. Assessment for potential risk factor modification, dietary therapy or pharmacologic therapy may be warranted, if clinically indicated. Aortic Atherosclerosis (ICD10-I70.0) and Emphysema (ICD10-J43.9). Electronically Signed   By: Trudie Reed M.D.   On: 01/02/2023 09:18   DG CHEST PORT 1 VIEW  Result Date: 01/01/2023 CLINICAL DATA:  [id number] Dyspnea [id number] EXAM: PORTABLE CHEST 1 VIEW COMPARISON:  December 26, 2020 FINDINGS: The cardiomediastinal silhouette is unchanged in contour.Emphysematous changes. No pleural effusion. No pneumothorax. Increased conspicuity of a LEFT  apical irregular nodule. No focal consolidation. IMPRESSION: Increased conspicuity of a LEFT apical irregular nodule. Recommend further evaluation with dedicated chest  CT. Electronically Signed   By: Meda Klinefelter M.D.   On: 01/01/2023 16:16     Medical Consultants:   None.   Subjective:    [name] abdominal pain is improved had a bowel movement small BM.  Objective:    Vitals:   01/02/23 2029 01/02/23 2031 01/03/23 0406 01/03/23 0737  BP:  116/81 (!) 128/94   Pulse:  (!) 110    Resp:  16 16   Temp:  97.8 F (36.6 C) 97.8 F (36.6 C)   TempSrc:  Oral Oral   SpO2: 97% 100% 100% 99%  Weight:      Height:       SpO2: 99 % O2 Flow Rate (L/min): 2 L/min   Intake/Output Summary (Last 24 hours) at 01/03/2023 0855 Last data filed at 01/03/2023 8119 Gross per 24 hour  Intake 320.38 ml  Output 1000 ml  Net -679.62 ml   Filed Weights   12/29/22 1937  Weight: 48 kg    Exam: General exam: In no acute distress. Respiratory system: Good air movement and clear to auscultation. Cardiovascular system: S1 & S2 heard, RRR. No JVD. Gastrointestinal system: Abdomen is nondistended, soft and nontender.  Extremities: No pedal edema. Skin: No rashes, lesions or ulcers Psychiatry: Judgement and insight appear normal. Mood & affect appropriate.  Data Reviewed:    Labs: Basic Metabolic Panel: Recent Labs  Lab 12/29/22 1142 12/30/22 0558 12/30/22 0840 12/31/22 0556 01/01/23 1555 01/03/23 0520  NA 137 138  --  132*  --  133*  K 3.9 3.1*  --  3.8  --  4.0  CL 103 108  --  106  --  98  CO2 21* 18*  --  13*  --  26  GLUCOSE 121* 80  --  38*  --  125*  BUN 24* 13  --  9  --  7*  CREATININE 0.93 0.72  --  0.73  --  0.73  CALCIUM 9.0 8.1*  --  8.1*  --  8.6*  MG  --   --  1.5*  --  1.6* 2.2   GFR Estimated Creatinine Clearance: 56.7 mL/min (by C-G formula based on SCr of 0.73 mg/dL). Liver Function Tests: Recent Labs  Lab 12/29/22 1142  AST 187*  ALT 121*  ALKPHOS 124  BILITOT 0.6  PROT 7.7  ALBUMIN 3.4*   Recent Labs  Lab 12/29/22 1142 12/30/22 0558  LIPASE 335* 142*   No results for input(s): "AMMONIA" in the  last 168 hours. Coagulation profile No results for input(s): "INR", "PROTIME" in the last 168 hours. COVID-19 Labs  No results for input(s): "DDIMER", "FERRITIN", "LDH", "CRP" in the last 72 hours.  No results found for: "SARSCOV2NAA"  CBC: Recent Labs  Lab 12/29/22 1142 12/30/22 0558 01/03/23 0520  WBC 9.5 6.5 5.8  NEUTROABS 7.9*  --   --   HGB 11.1* 9.6* 9.4*  HCT 32.6* 28.0* 27.9*  MCV 85.1 86.4 88.9  PLT 163 169 179   Cardiac Enzymes: No results for input(s): "CKTOTAL", "CKMB", "CKMBINDEX", "TROPONINI" in the last 168 hours. BNP (last 3 results) No results for input(s): "PROBNP" in the last 8760 hours. CBG: Recent Labs  Lab 12/31/22 0756  GLUCAP 198*   D-Dimer: No results for input(s): "DDIMER" in the last 72 hours. Hgb A1c: No results for input(s): "HGBA1C" in the last  72 hours. Lipid Profile: No results for input(s): "CHOL", "HDL", "LDLCALC", "TRIG", "CHOLHDL", "LDLDIRECT" in the last 72 hours. Thyroid function studies: No results for input(s): "TSH", "T4TOTAL", "T3FREE", "THYROIDAB" in the last 72 hours.  Invalid input(s): "FREET3" Anemia work up: No results for input(s): "VITAMINB12", "FOLATE", "FERRITIN", "TIBC", "IRON", "RETICCTPCT" in the last 72 hours. Sepsis Labs: Recent Labs  Lab 12/29/22 1142 12/29/22 1300 12/29/22 1742 12/30/22 0558 01/03/23 0520  WBC 9.5  --   --  6.5 5.8  LATICACIDVEN  --  2.3* 1.1  --   --    Microbiology No results found for this or any previous visit (from the past 240 hour(s)).   Medications:    budesonide (PULMICORT) nebulizer solution  0.25 mg Nebulization BID   enoxaparin (LOVENOX) injection  40 mg Subcutaneous Q24H   ipratropium-albuterol  3 mL Nebulization BID   zonisamide  100 mg Oral BID   Continuous Infusions:  sodium chloride 10 mL/hr at 01/01/23 1557   dextrose 5 % and 0.45 % NaCl with KCl 20 mEq/L 75 mL/hr at 01/02/23 2142      LOS: 4 days   Marinda Elk  [facility]  01/03/2023, 8:55 AM

## 2023-01-03 NOTE — Evaluation (Signed)
Physical Therapy Evaluation Patient Details Name: Lonnie Blair MRN: [id number] DOB: [birth year] Today's Date: 01/03/2023  History of Present Illness  Pt is a 72yo male presenting to [facility] ED with epigastric pain radiating to back, nausea, and vomiting.CT chest revealed " one suspicious appearing pulmonary nodule in the anterior aspect of the left upper lobe" Xray of abdomen revealed ileus. PMH: seizures. hx of ETOH.   Clinical Impression  Pt admitted with above diagnosis, received supine in bed without complaint of pain. Pt demonstrated mod IND for bed mobility and transfers, ambulated without AD in hallway and participated in stair training with L hand railing with min guard without incident; SpO2 monitored at regular intervals with dynamap and remained >/=96% on RA throughout. Pt's dynamic balance assessed via DGI and scored 14/24 indicating at this time pt is a fall risk. Encouraged pt to get up to restroom with staff with gait belt and use of RW to encourage time OOB and maintenance of strength and functional mobility. Added pt to mobility specialist caseload as well.  Pt currently with functional limitations due to the deficits listed below (see PT Problem List). Pt will benefit from acute skilled PT to increase their independence and safety with mobility to allow discharge.          Assistance Recommended at Discharge PRN  If plan is discharge home, recommend the following:  Can travel by private vehicle  A little help with walking and/or transfers;Help with stairs or ramp for entrance        Equipment Recommendations None recommended by PT (pt reports having DME)  Recommendations for Other Services       Functional Status Assessment Patient has had a recent decline in their functional status and demonstrates the ability to make significant improvements in function in a reasonable and predictable amount of time.     Precautions / Restrictions Precautions Precautions:  Fall Restrictions Weight Bearing Restrictions: No      Mobility  Bed Mobility Overal bed mobility: Modified Independent             General bed mobility comments: Increased time with HOB elevated    Transfers Overall transfer level: Modified independent Equipment used: None                    Ambulation/Gait Ambulation/Gait assistance: Min guard Gait Distance (Feet): 200 Feet Assistive device: None Gait Pattern/deviations: Step-through pattern, Shuffle, Drifts right/left, Narrow base of support Gait velocity: decreased     General Gait Details: Pt ambulated without AD and with min guard for ~259ft demonstrating some sway and shuffling steps as well as NBOS, distance limited by fatigue.  Stairs Stairs: Yes Stairs assistance: Min guard Stair Management: One rail Left, Step to pattern, Forwards Number of Stairs: 2 General stair comments: Pt guided through stair training with min guard and cues for sequencing, no overt LOB noted or physical assist required.  Wheelchair Mobility     Tilt Bed    Modified Rankin (Stroke Patients Only)       Balance Overall balance assessment: Needs assistance Sitting-balance support: Feet supported, No upper extremity supported Sitting balance-Leahy Scale: Good     Standing balance support: No upper extremity supported, During functional activity Standing balance-Leahy Scale: Good                   Standardized Balance Assessment Standardized Balance Assessment : Dynamic Gait Index   Dynamic Gait Index Level Surface: Mild Impairment Change in Gait Speed: Mild Impairment  Gait with Horizontal Head Turns: Mild Impairment Gait with Vertical Head Turns: Mild Impairment Gait and Pivot Turn: Mild Impairment Step Over Obstacle: Mild Impairment Step Around Obstacles: Moderate Impairment Steps: Moderate Impairment Total Score: 14       Pertinent Vitals/Pain Pain Assessment Pain Assessment: No/denies pain     Home Living Family/patient expects to be discharged to:: Private residence Living Arrangements: Spouse/significant other Available Help at Discharge: Family Type of Home: House Home Access: Stairs to enter Entrance Stairs-Rails: Left Entrance Stairs-Number of Steps: 6   Home Layout: One level Home Equipment: Agricultural consultant (2 wheels);Cane - single point      Prior Function Prior Level of Function : Independent/Modified Independent;Driving;Working/employed             Mobility Comments: IND ADLs Comments: IND     Hand Dominance        Extremity/Trunk Assessment   Upper Extremity Assessment Upper Extremity Assessment: Overall WFL for tasks assessed    Lower Extremity Assessment Lower Extremity Assessment: RLE deficits/detail;LLE deficits/detail RLE Deficits / Details: pt's MMT grossly hips 3+/5, knees 4-/5, ankles 3+/5. RLE Sensation: WNL LLE Deficits / Details: pt's MMT grossly hips 3+/5, knees 4-/5, ankles 3+/5. LLE Sensation: WNL    Cervical / Trunk Assessment Cervical / Trunk Assessment: Normal  Communication   Communication: No difficulties  Cognition Arousal/Alertness: Awake/alert Behavior During Therapy: WFL for tasks assessed/performed Overall Cognitive Status: Within Functional Limits for tasks assessed                                          General Comments General comments (skin integrity, edema, etc.): Wife Liborio Nixon present    Exercises     Assessment/Plan    PT Assessment Patient needs continued PT services  PT Problem List Decreased strength;Decreased activity tolerance;Decreased balance;Decreased mobility;Decreased knowledge of use of DME       PT Treatment Interventions DME instruction;Gait training;Stair training;Functional mobility training;Therapeutic activities;Therapeutic exercise;Balance training;Patient/family education    PT Goals (Current goals can be found in the Care Plan section)  Acute Rehab PT  Goals Patient Stated Goal: To get back to work PT Goal Formulation: With patient Time For Goal Achievement: 01/17/23 Potential to Achieve Goals: Good    Frequency Min 1X/week     Co-evaluation               AM-PAC PT "6 Clicks" Mobility  Outcome Measure Help needed turning from your back to your side while in a flat bed without using bedrails?: None Help needed moving from lying on your back to sitting on the side of a flat bed without using bedrails?: None Help needed moving to and from a bed to a chair (including a wheelchair)?: A Little Help needed standing up from a chair using your arms (e.g., wheelchair or bedside chair)?: A Little Help needed to walk in hospital room?: A Little Help needed climbing 3-5 steps with a railing? : A Lot 6 Click Score: 19    End of Session Equipment Utilized During Treatment: Gait belt Activity Tolerance: Patient tolerated treatment well;No increased pain Patient left: in chair;with call bell/phone within reach;with family/visitor present Nurse Communication: Mobility status PT Visit Diagnosis: Other abnormalities of gait and mobility (R26.89)    Time: 4098-1191 PT Time Calculation (min) (ACUTE ONLY): 19 min   Charges:   PT Evaluation $PT Eval Low Complexity: 1 Low   PT General  Charges $$ ACUTE PT VISIT: 1 Visit         Jamesetta Geralds, PT, DPT [facility] Rehabilitation Department Office: [phone]  Jamesetta Geralds 01/03/2023, 12:22 PM

## 2023-01-04 ENCOUNTER — Inpatient Hospital Stay (HOSPITAL_COMMUNITY): Payer: Medicare PPO

## 2023-01-04 DIAGNOSIS — K852 Alcohol induced acute pancreatitis without necrosis or infection: Secondary | ICD-10-CM | POA: Diagnosis not present

## 2023-01-04 LAB — BASIC METABOLIC PANEL
Anion gap: 6 (ref 5–15)
BUN: <5 mg/dL — ABNORMAL LOW (ref 8–23)
CO2: 24 mmol/L (ref 22–32)
Calcium: 8.6 mg/dL — ABNORMAL LOW (ref 8.9–10.3)
Chloride: 108 mmol/L (ref 98–111)
Creatinine, Ser: 0.66 mg/dL (ref 0.61–1.24)
GFR, Estimated: >60 mL/min (ref >60)
Glucose, Bld: 110 mg/dL — ABNORMAL HIGH (ref 70–99)
Potassium: 3.9 mmol/L (ref 3.5–5.1)
Sodium: 138 mmol/L (ref 135–145)

## 2023-01-04 NOTE — Progress Notes (Addendum)
[facility] PROGRESS NOTE    Progress Note  Lonnie Blair  LOV:[id number] DOB: 1951-06-18 DOA: 12/29/2022 PCP: Diamantina Providence, FNP     Brief Narrative:   Lonnie Blair is an 72 y.o. male past medical significant for seizure disorder, alcohol use as well as alcohol abuse comes into the emergency room with epigastric pain radiation to the back nausea and vomiting that started on the day of admission.  With his last drink was prior to admission.   Assessment/Plan:   Acute alcoholic pancreatitis He would like to try diet , but NPO due to Ileus. KVO IV fluids strict I's and O's and daily weights.  New Ileus: Patient removed NG tube, had about 1000 cc from NG tube. Abdominal x-rays shows small bowel obstruction bowel gas pattern He relates continues to have bowel movements and passing gas. Try to keep potassium greater than 2 magnesium greater than 4. Will start him on clear liquid diet.  Elevated LFTs: Likely due to alcohol abuse split 2-1, LFTs improved  Seizure disorder (HCC) Continue Zonegran.  Hypokalemia-hypomagnesemia: Repleted, try to keep potassium greater than 4 magnesium greater than 2.  Severe protein caloric malnutrition: Will need Ensure 3 times daily once able to take orals.  Episode of hypoxia: Chest x-ray was done which showed a conspicuous apical irregular nodule, CT scan of the chest without contrast showed suspicious appearing pulmonary nodule in the left upper lobe, will need a follow-up CT as an outpatient in 1 months, small bilateral pleural effusion with diffuse bronchial thickening He was placed on 2 L of oxygen and started on inhalers and albuterol.   DVT prophylaxis: lovenox Family Communication:none Status is: Observation The patient remains OBS appropriate and will d/c before 2 midnights.    Code Status:     Code Status Orders  (From admission, onward)           Start     Ordered   12/29/22 1706  Full code  Continuous        Question:  By:  Answer:  Consent: discussion documented in EHR   12/29/22 1706           Code Status History     This patient has a current code status but no historical code status.         IV Access:   Peripheral IV   Procedures and diagnostic studies:   DG Abd 1 View  Result Date: 01/04/2023 CLINICAL DATA:  72 year old male with history of abdominal pain. EXAM: ABDOMEN - 1 VIEW COMPARISON:  Chest x-ray 01/02/2023. FINDINGS: Previously noted nasogastric tube has been removed. Numerous dilated loops of small bowel are again noted throughout the central abdomen, measuring up to 3.9 cm in diameter. Paucity of colonic gas and stool noted. No definite pneumoperitoneum. IMPRESSION: 1. Bowel-gas pattern remains compatible with small bowel obstruction. 2. Previously noted nasogastric tube is no longer visualized, presumably removed. Electronically Signed   By: Trudie Reed M.D.   On: 01/04/2023 06:49   DG Abd Portable 1V  Result Date: 01/02/2023 CLINICAL DATA:  NG tube placement EXAM: PORTABLE ABDOMEN - 1 VIEW COMPARISON:  01/02/2023 FINDINGS: Limited field of view for tube placement verification. An enteric tube is present with tip in the upper abdomen at the midline consistent with location in the distal stomach. Persistent gaseous distention of small bowel could indicate ileus or obstruction. No change. No radiopaque stones. Vascular calcifications. Degenerative changes in the spine and hips. Lung bases are clear. IMPRESSION: Enteric tube tip  projects over the upper abdomen at the midline consistent with location in the distal stomach. Persistent gaseous distention of small bowel. Electronically Signed   By: Burman Nieves M.D.   On: 01/02/2023 15:19   DG Abd 1 View  Result Date: 01/02/2023 CLINICAL DATA:  Abdominal pain EXAM: ABDOMEN - 1 VIEW COMPARISON:  CT abdomen and pelvis dated 12/29/2022 FINDINGS: Mildly distended gas-filled loops of small bowel throughout the abdomen  and pelvis. Air is present within the rectosigmoid colon. No evidence of soft tissue mass or abnormal fluid collection. No evidence of free intraperitoneal air. No evidence of renal or ureteral calculi. Visualized osseous structures about the abdomen and pelvis are unremarkable. IMPRESSION: Mildly distended gas-filled loops of small bowel throughout the abdomen and pelvis, most likely ileus but possibly a distal partial small bowel obstruction. Electronically Signed   By: Bary Richard M.D.   On: 01/02/2023 13:58     Medical Consultants:   None.   Subjective:    Lonnie Blair continues to have bowel movement and passing gas no abdominal pain.  Objective:    Vitals:   01/03/23 1951 01/03/23 2017 01/04/23 0431 01/04/23 0810  BP:  119/74 127/71   Pulse:  90 88 87  Resp:  16 16 18   Temp:  97.6 F (36.4 C) 97.6 F (36.4 C)   TempSrc:  Oral Oral   SpO2: 95% 100% 100% 99%  Weight:      Height:       SpO2: 99 % O2 Flow Rate (L/min): 2 L/min   Intake/Output Summary (Last 24 hours) at 01/04/2023 0913 Last data filed at 01/04/2023 0200 Gross per 24 hour  Intake 1268.64 ml  Output 575 ml  Net 693.64 ml   Filed Weights   12/29/22 1937  Weight: 48 kg    Exam: General exam: In no acute distress. Respiratory system: Good air movement and clear to auscultation. Cardiovascular system: S1 & S2 heard, RRR. No JVD. Gastrointestinal system: Abdomen is nondistended, soft and nontender.  Extremities: No pedal edema. Skin: No rashes, lesions or ulcers Psychiatry: Judgement and insight appear normal. Mood & affect appropriate.  Data Reviewed:    Labs: Basic Metabolic Panel: Recent Labs  Lab 12/29/22 1142 12/30/22 0558 12/30/22 0840 12/31/22 0556 01/01/23 1555 01/03/23 0520 01/04/23 0505  NA 137 138  --  132*  --  133* 138  K 3.9 3.1*  --  3.8  --  4.0 3.9  CL 103 108  --  106  --  98 108  CO2 21* 18*  --  13*  --  26 24  GLUCOSE 121* 80  --  38*  --  125* 110*  BUN 24* 13   --  9  --  7* <5*  CREATININE 0.93 0.72  --  0.73  --  0.73 0.66  CALCIUM 9.0 8.1*  --  8.1*  --  8.6* 8.6*  MG  --   --  1.5*  --  1.6* 2.2  --    GFR Estimated Creatinine Clearance: 56.7 mL/min (by C-G formula based on SCr of 0.66 mg/dL). Liver Function Tests: Recent Labs  Lab 12/29/22 1142 01/03/23 0925  AST 187* 45*  ALT 121* 35  ALKPHOS 124 64  BILITOT 0.6 0.6  PROT 7.7 6.3*  ALBUMIN 3.4* 2.7*   Recent Labs  Lab 12/29/22 1142 12/30/22 0558  LIPASE 335* 142*   No results for input(s): "AMMONIA" in the last 168 hours. Coagulation profile No results for input(s): "INR", "  PROTIME" in the last 168 hours. COVID-19 Labs  No results for input(s): "DDIMER", "FERRITIN", "LDH", "CRP" in the last 72 hours.  No results found for: "SARSCOV2NAA"  CBC: Recent Labs  Lab 12/29/22 1142 12/30/22 0558 01/03/23 0520  WBC 9.5 6.5 5.8  NEUTROABS 7.9*  --   --   HGB 11.1* 9.6* 9.4*  HCT 32.6* 28.0* 27.9*  MCV 85.1 86.4 88.9  PLT 163 169 179   Cardiac Enzymes: No results for input(s): "CKTOTAL", "CKMB", "CKMBINDEX", "TROPONINI" in the last 168 hours. BNP (last 3 results) No results for input(s): "PROBNP" in the last 8760 hours. CBG: Recent Labs  Lab 12/31/22 0756  GLUCAP 198*   D-Dimer: No results for input(s): "DDIMER" in the last 72 hours. Hgb A1c: No results for input(s): "HGBA1C" in the last 72 hours. Lipid Profile: No results for input(s): "CHOL", "HDL", "LDLCALC", "TRIG", "CHOLHDL", "LDLDIRECT" in the last 72 hours. Thyroid function studies: No results for input(s): "TSH", "T4TOTAL", "T3FREE", "THYROIDAB" in the last 72 hours.  Invalid input(s): "FREET3" Anemia work up: No results for input(s): "VITAMINB12", "FOLATE", "FERRITIN", "TIBC", "IRON", "RETICCTPCT" in the last 72 hours. Sepsis Labs: Recent Labs  Lab 12/29/22 1142 12/29/22 1300 12/29/22 1742 12/30/22 0558 01/03/23 0520  WBC 9.5  --   --  6.5 5.8  LATICACIDVEN  --  2.3* 1.1  --   --     Microbiology No results found for this or any previous visit (from the past 240 hour(s)).   Medications:    budesonide (PULMICORT) nebulizer solution  0.25 mg Nebulization BID   enoxaparin (LOVENOX) injection  40 mg Subcutaneous Q24H   ipratropium-albuterol  3 mL Nebulization BID   zonisamide  100 mg Oral BID   Continuous Infusions:  dextrose 5 % and 0.45 % NaCl with KCl 20 mEq/L 75 mL/hr at 01/03/23 1027      LOS: 5 days   Marinda Elk  [facility]  01/04/2023, 9:13 AM

## 2023-01-04 NOTE — Plan of Care (Signed)

## 2023-01-04 NOTE — Plan of Care (Signed)
  Problem: Education: Goal: Knowledge of General Education information will improve Description: Including pain rating scale, medication(s)/side effects and non-pharmacologic comfort measures Outcome: Progressing   Problem: Health Behavior/Discharge Planning: Goal: Ability to manage health-related needs will improve Outcome: Progressing   Problem: Clinical Measurements: Goal: Ability to maintain clinical measurements within normal limits will improve Outcome: Progressing Goal: Will remain free from infection Outcome: Progressing Goal: Diagnostic test results will improve Outcome: Progressing   Problem: Nutrition: Goal: Adequate nutrition will be maintained Outcome: Progressing   

## 2023-01-05 DIAGNOSIS — R918 Other nonspecific abnormal finding of lung field: Secondary | ICD-10-CM | POA: Insufficient documentation

## 2023-01-05 LAB — BASIC METABOLIC PANEL
Anion gap: 5 (ref 5–15)
BUN: <5 mg/dL — ABNORMAL LOW (ref 8–23)
CO2: 24 mmol/L (ref 22–32)
Calcium: 8.7 mg/dL — ABNORMAL LOW (ref 8.9–10.3)
Chloride: 109 mmol/L (ref 98–111)
Creatinine, Ser: 0.66 mg/dL (ref 0.61–1.24)
GFR, Estimated: >60 mL/min (ref >60)
Glucose, Bld: 85 mg/dL (ref 70–99)
Potassium: 4 mmol/L (ref 3.5–5.1)
Sodium: 138 mmol/L (ref 135–145)

## 2023-01-05 NOTE — TOC Transition Note (Signed)
Transition of Care [facility]) - CM/SW Discharge Note   Patient Details  Name: Lonnie Blair MRN: [id number] Date of Birth: 1950-07-11  Transition of Care [facility]) CM/SW Contact:  Otelia Santee, LCSW Phone Number: 01/05/2023, 9:25 AM   Clinical Narrative:    Met with pt and spouse at spouses request. CSW reviewed resources placed on AVS. Pt's spouse requesting to complete POA paperwork while in the hospital. Sent message to spiritual care for assistance with completing POA paperwork.    Final next level of care: Home/Self Care Barriers to Discharge: No Barriers Identified   Patient Goals and CMS Choice CMS Medicare.gov Compare Post Acute Care list provided to:: Patient Choice offered to / list presented to : Patient, Spouse  Discharge Placement                         Discharge Plan and Services Additional resources added to the After Visit Summary for   In-house Referral: NA Discharge Planning Services: NA Post Acute Care Choice: NA          DME Arranged: N/A DME Agency: NA                  Social Determinants of Health (SDOH) Interventions SDOH Screenings   Food Insecurity: No Food Insecurity (12/29/2022)  Housing: Low Risk  (12/29/2022)  Transportation Needs: No Transportation Needs (12/29/2022)  Utilities: Not At Risk (12/29/2022)  Tobacco Use: High Risk (12/29/2022)     Readmission Risk Interventions    01/01/2023   11:08 AM  Readmission Risk Prevention Plan  Post Dischage Appt Complete  Medication Screening Complete  Transportation Screening Complete

## 2023-01-05 NOTE — Discharge Summary (Signed)
Lonnie Blair ZOX:[id number] DOB: May 12, 1951 DOA: 12/29/2022  PCP: Diamantina Providence, FNP  Admit date: 12/29/2022 Discharge date: 01/05/2023  Time spent: 35 minutes  Recommendations for Outpatient Follow-up:  Close pcp f/u Repeat CT chest 1 month     Discharge Diagnoses:  Principal Problem:   Acute alcoholic pancreatitis Active Problems:   Intractable vomiting with nausea   Seizure disorder (HCC)   Alcoholic pancreatitis   Chronic obstructive pulmonary disease, unspecified (HCC)   Essential hypertension   Smoker   Lung mass   Discharge Condition: improved  Diet recommendation: bland diet to start  [facility] Weights   12/29/22 1937  Weight: 48 kg    History of present illness:  From admission h and p Lonnie Blair is a 72 y.o. male with medical history significant for seizure disorder as well as alcohol abuse, presented to the emergency room with a [place] of epigastric and left upper quadrant abdominal pain that started at 3 AM today with associated recurrent nausea and vomiting without diarrhea.  His last alcoholic drink was beer yesterday.  He admitted to chills without measured fever.  Has been having mild urinary urgency without frequency or dysuria or hematuria or flank pain.  No chest pain or palpitations.  No cough or wheezing or dyspnea.   Hospital Course:  Hx seizures, alcohol abuse, copd, presenting with abdominal pain and nausea/vomiting. Found to have acute alcohol pancreatitis. Also developed ileus that was treated with NG tube that has since been discontinued. Abdominal pain resolved, tolerating solid food, no nausea/vomiting. Is stooling. Evaluated by PT and cleared for home discharge. No present signs of alcohol withdrawal, last drink now over a week ago. Does have a nodule incidentally seen on CT that is concerning for malignancy, radiology advising repeat CT in 1 mo. COPD stable, although he was treated with oxygen no documented hypoxia and reports his breathing  is stable and at its baseline.   Procedures: NG tube placeent   Consultations: none  Discharge Exam: Vitals:   01/05/23 0445 01/05/23 0816  BP: 107/70   Pulse: 87   Resp: 16   Temp: 98.1 F (36.7 C)   SpO2: 100% 99%    General: NAD Cardiovascular: RRR Respiratory: clear Abdomen: soft, non-tender, normal bowel sounds  Discharge Instructions   Discharge Instructions     Diet - low sodium heart healthy   Complete by: As directed    Increase activity slowly   Complete by: As directed       Allergies as of 01/05/2023       Reactions   Tylenol [acetaminophen] Itching        Medication List     TAKE these medications    lactose free nutrition Liqd Take 237 mLs by mouth See admin instructions. Drink 237 ml's of Strawberry Boost Plus by mouth two times a day   zonisamide 100 MG capsule Commonly known as: ZONEGRAN Take 100 mg by mouth in the morning and at bedtime.       Allergies  Allergen Reactions   Tylenol [Acetaminophen] Itching    Follow-up Information     Diamantina Providence, FNP Follow up.   Specialty: Nurse Practitioner Contact information: [address] Ginette [place] [address] [phone]                  The results of significant diagnostics from this hospitalization (including imaging, microbiology, ancillary and laboratory) are listed below for reference.    Significant Diagnostic Studies: DG Abd 1  View  Result Date: 01/04/2023 CLINICAL DATA:  72 year old male with history of abdominal pain. EXAM: ABDOMEN - 1 VIEW COMPARISON:  Chest x-ray 01/02/2023. FINDINGS: Previously noted nasogastric tube has been removed. Numerous dilated loops of small bowel are again noted throughout the central abdomen, measuring up to 3.9 cm in diameter. Paucity of colonic gas and stool noted. No definite pneumoperitoneum. IMPRESSION: 1. Bowel-gas pattern remains compatible with small bowel obstruction. 2. Previously noted nasogastric  tube is no longer visualized, presumably removed. Electronically Signed   By: Trudie Reed M.D.   On: 01/04/2023 06:49   DG Abd Portable 1V  Result Date: 01/02/2023 CLINICAL DATA:  NG tube placement EXAM: PORTABLE ABDOMEN - 1 VIEW COMPARISON:  01/02/2023 FINDINGS: Limited field of view for tube placement verification. An enteric tube is present with tip in the upper abdomen at the midline consistent with location in the distal stomach. Persistent gaseous distention of small bowel could indicate ileus or obstruction. No change. No radiopaque stones. Vascular calcifications. Degenerative changes in the spine and hips. Lung bases are clear. IMPRESSION: Enteric tube tip projects over the upper abdomen at the midline consistent with location in the distal stomach. Persistent gaseous distention of small bowel. Electronically Signed   By: Burman Nieves M.D.   On: 01/02/2023 15:19   DG Abd 1 View  Result Date: 01/02/2023 CLINICAL DATA:  Abdominal pain EXAM: ABDOMEN - 1 VIEW COMPARISON:  CT abdomen and pelvis dated 12/29/2022 FINDINGS: Mildly distended gas-filled loops of small bowel throughout the abdomen and pelvis. Air is present within the rectosigmoid colon. No evidence of soft tissue mass or abnormal fluid collection. No evidence of free intraperitoneal air. No evidence of renal or ureteral calculi. Visualized osseous structures about the abdomen and pelvis are unremarkable. IMPRESSION: Mildly distended gas-filled loops of small bowel throughout the abdomen and pelvis, most likely ileus but possibly a distal partial small bowel obstruction. Electronically Signed   By: Bary Richard M.D.   On: 01/02/2023 13:58   CT CHEST WO CONTRAST  Result Date: 01/02/2023 CLINICAL DATA:  72 year old male with history of pulmonary nodule. Followup study. EXAM: CT CHEST WITHOUT CONTRAST TECHNIQUE: Multidetector CT imaging of the chest was performed following the standard protocol without IV contrast. RADIATION DOSE  REDUCTION: This exam was performed according to the departmental dose-optimization program which includes automated exposure control, adjustment of the mA and/or kV according to patient size and/or use of iterative reconstruction technique. COMPARISON:  Chest x-ray 01/01/2023.  Chest CT 04/25/2021. FINDINGS: Cardiovascular: Heart size is normal. There is no significant pericardial fluid, thickening or pericardial calcification. There is aortic atherosclerosis, as well as atherosclerosis of the great vessels of the mediastinum and the coronary arteries, including calcified atherosclerotic plaque in the left main, left anterior descending, left circumflex and right coronary arteries. Mediastinum/Nodes: No pathologically enlarged mediastinal or hilar lymph nodes. Please note that accurate exclusion of hilar adenopathy is limited on noncontrast CT scans. Esophagus is unremarkable in appearance. No axillary lymphadenopathy. Lungs/Pleura: In the anterior aspect of the left upper lobe (axial image 34 of series 6) there is a 1.0 x 0.7 cm pulmonary nodule with irregular slightly spiculated margins. 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 There also some nodular areas of architectural distortion in the extreme lung bases, in association with bronchial wall thickening, thickening of the peribronchovascular interstitium and regional areas  of volume loss, favored to be of infectious or inflammatory etiology. Small bilateral pleural effusions lying dependently. Diffuse bronchial wall thickening with moderate centrilobular and paraseptal emphysema. Upper Abdomen: Aortic atherosclerosis. Musculoskeletal: There are no aggressive appearing lytic or blastic lesions noted in the visualized portions of the skeleton.  IMPRESSION: 1. There is at least one suspicious appearing pulmonary nodule in the anterior aspect of the left upper lobe (axial image 35 of series 6) measuring 1.0 x 0.7 cm on today's study. Several other pulmonary nodules are noted elsewhere in the lungs bilaterally, and are favored to be areas of chronic scarring and/or of infectious or inflammatory etiology. Short-term follow-up repeat noncontrast chest CT is recommended in 1 month following trial of antimicrobial therapy to re-evaluate these lesions. Should this left upper lobe pulmonary nodule persist or increase in size, further evaluation with PET-CT would be recommended. 2. Small bilateral pleural effusions. 3. Diffuse bronchial wall thickening with moderate centrilobular and paraseptal emphysema; imaging findings suggestive of underlying COPD. 4. Aortic atherosclerosis, in addition to left main and three-vessel coronary artery disease. Please note that although the presence of coronary artery calcium documents the presence of coronary artery disease, the severity of this disease and any potential stenosis cannot be assessed on this non-gated CT examination. Assessment for potential risk factor modification, dietary therapy or pharmacologic therapy may be warranted, if clinically indicated. Aortic Atherosclerosis (ICD10-I70.0) and Emphysema (ICD10-J43.9). Electronically Signed   By: Trudie Reed M.D.   On: 01/02/2023 09:18   DG CHEST PORT 1 VIEW  Result Date: 01/01/2023 CLINICAL DATA:  [id number] Dyspnea [id number] EXAM: PORTABLE CHEST 1 VIEW COMPARISON:  December 26, 2020 FINDINGS: The cardiomediastinal silhouette is unchanged in contour.Emphysematous changes. No pleural effusion. No pneumothorax. Increased conspicuity of a LEFT apical irregular nodule. No focal consolidation. IMPRESSION: Increased conspicuity of a LEFT apical irregular nodule. Recommend further evaluation with dedicated chest CT. Electronically Signed   By: Meda Klinefelter M.D.   On:  01/01/2023 16:16   CT ABDOMEN PELVIS W CONTRAST  Result Date: 12/29/2022 CLINICAL DATA:  Abdominal pain, acute, nonlocalized EXAM: CT ABDOMEN AND PELVIS WITH CONTRAST TECHNIQUE: Multidetector CT imaging of the abdomen and pelvis was performed using the standard protocol following bolus administration of intravenous contrast. RADIATION DOSE REDUCTION: This exam was performed according to the departmental dose-optimization program which includes automated exposure control, adjustment of the mA and/or kV according to patient size and/or use of iterative reconstruction technique. CONTRAST:  OMNIPAQUE IOHEXOL 300 MG/ML  SOLN COMPARISON:  CT scan chest from 04/25/2021 and CT scan abdomen and pelvis from 01/10/2013. FINDINGS: Lower chest: The lung bases are clear. Scattered emphysematous blebs noted in the imaged lung bases. No pleural effusion. The heart is normal in size. No pericardial effusion. Hepatobiliary: The liver is normal in size. Non-cirrhotic configuration. No suspicious mass. These is mild diffuse hepatic steatosis. No intrahepatic or extrahepatic bile duct dilation. No calcified gallstones. Normal gallbladder wall thickness. No pericholecystic inflammatory changes. Pancreas: No focal pancreatic lesion. Main pancreatic duct is dilated measuring up to 6-7 mm; however, no obstructing mass seen. There are scattered sub 5 mm foci of calcifications in the pancreas, nonspecific but can be seen with sequela of chronic pancreatitis. No peripancreatic fat stranding. Spleen: Within normal limits. No focal lesion. Adrenals/Urinary Tract: Adrenal glands are unremarkable. No suspicious renal mass. No hydronephrosis. No renal or ureteric calculi. Unremarkable urinary bladder. Stomach/Bowel: No disproportionate dilation of the small or large bowel loops. No evidence of abnormal bowel wall thickening or inflammatory  changes. The appendix was not confidently visualized; however there is no acute inflammatory process  in the right lower quadrant. Vascular/Lymphatic: No ascites or pneumoperitoneum. No abdominal or pelvic lymphadenopathy, by size criteria. No aneurysmal dilation of the major abdominal arteries. There are marked peripheral atherosclerotic vascular calcifications of the aorta and its major branches. Reproductive: Normal size prostate. Symmetric seminal vesicles. Other: The visualized soft tissues and abdominal wall are unremarkable. Musculoskeletal: No suspicious osseous lesions. There are mild multilevel degenerative changes in the visualized spine. IMPRESSION: 1. No acute inflammatory process identified within the abdomen or pelvis. 2. Multiple other nonacute observations, as described above. Aortic Atherosclerosis (ICD10-I70.0) and Emphysema (ICD10-J43.9). Electronically Signed   By: Jules Schick M.D.   On: 12/29/2022 14:02    Microbiology: No results found for this or any previous visit (from the past 240 hour(s)).   Labs: Basic Metabolic Panel: Recent Labs  Lab 12/30/22 0558 12/30/22 0840 12/31/22 0556 01/01/23 1555 01/03/23 0520 01/04/23 0505 01/05/23 0457  NA 138  --  132*  --  133* 138 138  K 3.1*  --  3.8  --  4.0 3.9 4.0  CL 108  --  106  --  98 108 109  CO2 18*  --  13*  --  26 24 24   GLUCOSE 80  --  38*  --  125* 110* 85  BUN 13  --  9  --  7* <5* <5*  CREATININE 0.72  --  0.73  --  0.73 0.66 0.66  CALCIUM 8.1*  --  8.1*  --  8.6* 8.6* 8.7*  MG  --  1.5*  --  1.6* 2.2  --   --    Liver Function Tests: Recent Labs  Lab 12/29/22 1142 01/03/23 0925  AST 187* 45*  ALT 121* 35  ALKPHOS 124 64  BILITOT 0.6 0.6  PROT 7.7 6.3*  ALBUMIN 3.4* 2.7*   Recent Labs  Lab 12/29/22 1142 12/30/22 0558  LIPASE 335* 142*   No results for input(s): "AMMONIA" in the last 168 hours. CBC: Recent Labs  Lab 12/29/22 1142 12/30/22 0558 01/03/23 0520  WBC 9.5 6.5 5.8  NEUTROABS 7.9*  --   --   HGB 11.1* 9.6* 9.4*  HCT 32.6* 28.0* 27.9*  MCV 85.1 86.4 88.9  PLT 163 169 179    Cardiac Enzymes: No results for input(s): "CKTOTAL", "CKMB", "CKMBINDEX", "TROPONINI" in the last 168 hours. BNP: BNP (last 3 results) Recent Labs    01/01/23 1555  BNP 47.8    ProBNP (last 3 results) No results for input(s): "PROBNP" in the last 8760 hours.  CBG: Recent Labs  Lab 12/31/22 0756  GLUCAP 198*       Signed:  Silvano Bilis MD.  [facility] 01/05/2023, 8:58 AM

## 2024-01-09 ENCOUNTER — Inpatient Hospital Stay (HOSPITAL_COMMUNITY)
Admission: EM | Admit: 2024-01-09 | Discharge: 2024-01-13 | DRG: 064 | Disposition: A | Discharge status: Home / Self Care | Attending: Family Medicine | Admitting: Family Medicine

## 2024-01-09 ENCOUNTER — Emergency Department (HOSPITAL_COMMUNITY)

## 2024-01-09 ENCOUNTER — Encounter (HOSPITAL_COMMUNITY): Payer: Self-pay

## 2024-01-09 ENCOUNTER — Inpatient Hospital Stay (HOSPITAL_COMMUNITY)

## 2024-01-09 ENCOUNTER — Other Ambulatory Visit: Payer: Self-pay

## 2024-01-09 DIAGNOSIS — E8809 Other disorders of plasma-protein metabolism, not elsewhere classified: Secondary | ICD-10-CM | POA: Diagnosis present

## 2024-01-09 DIAGNOSIS — I6340 Cerebral infarction due to embolism of unspecified cerebral artery: Principal | ICD-10-CM | POA: Diagnosis present

## 2024-01-09 DIAGNOSIS — I700 Atherosclerosis of aorta: Secondary | ICD-10-CM | POA: Diagnosis present

## 2024-01-09 DIAGNOSIS — R935 Abnormal findings on diagnostic imaging of other abdominal regions, including retroperitoneum: Secondary | ICD-10-CM | POA: Diagnosis not present

## 2024-01-09 DIAGNOSIS — C799 Secondary malignant neoplasm of unspecified site: Secondary | ICD-10-CM | POA: Diagnosis present

## 2024-01-09 DIAGNOSIS — D6869 Other thrombophilia: Secondary | ICD-10-CM | POA: Diagnosis present

## 2024-01-09 DIAGNOSIS — C801 Malignant (primary) neoplasm, unspecified: Secondary | ICD-10-CM | POA: Diagnosis not present

## 2024-01-09 DIAGNOSIS — G40909 Epilepsy, unspecified, not intractable, without status epilepticus: Secondary | ICD-10-CM | POA: Diagnosis present

## 2024-01-09 DIAGNOSIS — R64 Cachexia: Secondary | ICD-10-CM | POA: Diagnosis present

## 2024-01-09 DIAGNOSIS — R197 Diarrhea, unspecified: Secondary | ICD-10-CM | POA: Diagnosis not present

## 2024-01-09 DIAGNOSIS — I6521 Occlusion and stenosis of right carotid artery: Secondary | ICD-10-CM | POA: Diagnosis present

## 2024-01-09 DIAGNOSIS — R918 Other nonspecific abnormal finding of lung field: Secondary | ICD-10-CM | POA: Diagnosis present

## 2024-01-09 DIAGNOSIS — R29700 NIHSS score 0: Secondary | ICD-10-CM | POA: Diagnosis present

## 2024-01-09 DIAGNOSIS — U071 COVID-19: Secondary | ICD-10-CM | POA: Diagnosis present

## 2024-01-09 DIAGNOSIS — I951 Orthostatic hypotension: Secondary | ICD-10-CM | POA: Diagnosis not present

## 2024-01-09 DIAGNOSIS — R531 Weakness: Secondary | ICD-10-CM

## 2024-01-09 DIAGNOSIS — C259 Malignant neoplasm of pancreas, unspecified: Secondary | ICD-10-CM | POA: Diagnosis not present

## 2024-01-09 DIAGNOSIS — I6389 Other cerebral infarction: Secondary | ICD-10-CM | POA: Diagnosis not present

## 2024-01-09 DIAGNOSIS — C787 Secondary malignant neoplasm of liver and intrahepatic bile duct: Secondary | ICD-10-CM | POA: Diagnosis present

## 2024-01-09 DIAGNOSIS — J439 Emphysema, unspecified: Secondary | ICD-10-CM | POA: Diagnosis present

## 2024-01-09 DIAGNOSIS — I10 Essential (primary) hypertension: Secondary | ICD-10-CM | POA: Diagnosis present

## 2024-01-09 DIAGNOSIS — I639 Cerebral infarction, unspecified: Secondary | ICD-10-CM | POA: Diagnosis not present

## 2024-01-09 DIAGNOSIS — F17200 Nicotine dependence, unspecified, uncomplicated: Secondary | ICD-10-CM | POA: Diagnosis present

## 2024-01-09 DIAGNOSIS — R160 Hepatomegaly, not elsewhere classified: Secondary | ICD-10-CM

## 2024-01-09 DIAGNOSIS — E43 Unspecified severe protein-calorie malnutrition: Secondary | ICD-10-CM | POA: Diagnosis present

## 2024-01-09 DIAGNOSIS — J441 Chronic obstructive pulmonary disease with (acute) exacerbation: Secondary | ICD-10-CM | POA: Diagnosis present

## 2024-01-09 DIAGNOSIS — R569 Unspecified convulsions: Secondary | ICD-10-CM | POA: Diagnosis not present

## 2024-01-09 DIAGNOSIS — G9340 Encephalopathy, unspecified: Secondary | ICD-10-CM | POA: Diagnosis present

## 2024-01-09 DIAGNOSIS — E860 Dehydration: Secondary | ICD-10-CM | POA: Diagnosis present

## 2024-01-09 DIAGNOSIS — Z79899 Other long term (current) drug therapy: Secondary | ICD-10-CM | POA: Diagnosis not present

## 2024-01-09 DIAGNOSIS — K5900 Constipation, unspecified: Secondary | ICD-10-CM | POA: Diagnosis present

## 2024-01-09 DIAGNOSIS — F17210 Nicotine dependence, cigarettes, uncomplicated: Secondary | ICD-10-CM | POA: Diagnosis present

## 2024-01-09 DIAGNOSIS — R5383 Other fatigue: Secondary | ICD-10-CM

## 2024-01-09 DIAGNOSIS — Z681 Body mass index (BMI) 19 or less, adult: Secondary | ICD-10-CM | POA: Diagnosis not present

## 2024-01-09 DIAGNOSIS — J449 Chronic obstructive pulmonary disease, unspecified: Secondary | ICD-10-CM | POA: Diagnosis present

## 2024-01-09 DIAGNOSIS — Z886 Allergy status to analgesic agent status: Secondary | ICD-10-CM

## 2024-01-09 DIAGNOSIS — G8194 Hemiplegia, unspecified affecting left nondominant side: Secondary | ICD-10-CM | POA: Diagnosis present

## 2024-01-09 DIAGNOSIS — E785 Hyperlipidemia, unspecified: Secondary | ICD-10-CM | POA: Diagnosis present

## 2024-01-09 DIAGNOSIS — R55 Syncope and collapse: Secondary | ICD-10-CM | POA: Diagnosis not present

## 2024-01-09 LAB — COMPREHENSIVE METABOLIC PANEL WITH GFR
ALT: 28 U/L (ref 0–44)
AST: 52 U/L — ABNORMAL HIGH (ref 15–41)
Albumin: 3.1 g/dL — ABNORMAL LOW (ref 3.5–5.0)
Alkaline Phosphatase: 117 U/L (ref 38–126)
Anion gap: 12 (ref 5–15)
BUN: 19 mg/dL (ref 8–23)
CO2: 25 mmol/L (ref 22–32)
Calcium: 9.1 mg/dL (ref 8.9–10.3)
Chloride: 100 mmol/L (ref 98–111)
Creatinine, Ser: 1.03 mg/dL (ref 0.61–1.24)
GFR, Estimated: >60 mL/min (ref >60)
Glucose, Bld: 84 mg/dL (ref 70–99)
Potassium: 4.9 mmol/L (ref 3.5–5.1)
Sodium: 137 mmol/L (ref 135–145)
Total Bilirubin: 0.4 mg/dL (ref 0.0–1.2)
Total Protein: 6.9 g/dL (ref 6.5–8.1)

## 2024-01-09 LAB — CBC WITH DIFFERENTIAL/PLATELET
Abs Immature Granulocytes: 0.08 K/uL — ABNORMAL HIGH (ref 0.00–0.07)
Basophils Absolute: 0 K/uL (ref 0.0–0.1)
Basophils Relative: 0 %
Eosinophils Absolute: 0.4 K/uL (ref 0.0–0.5)
Eosinophils Relative: 2 %
HCT: 35.4 % — ABNORMAL LOW (ref 39.0–52.0)
Hemoglobin: 11.5 g/dL — ABNORMAL LOW (ref 13.0–17.0)
Immature Granulocytes: 1 %
Lymphocytes Relative: 14 %
Lymphs Abs: 2.1 K/uL (ref 0.7–4.0)
MCH: 29.6 pg (ref 26.0–34.0)
MCHC: 32.5 g/dL (ref 30.0–36.0)
MCV: 91.2 fL (ref 80.0–100.0)
Monocytes Absolute: 1.1 K/uL — ABNORMAL HIGH (ref 0.1–1.0)
Monocytes Relative: 8 %
Neutro Abs: 11.3 K/uL — ABNORMAL HIGH (ref 1.7–7.7)
Neutrophils Relative %: 75 %
Platelets: 121 K/uL — ABNORMAL LOW (ref 150–400)
RBC: 3.88 MIL/uL — ABNORMAL LOW (ref 4.22–5.81)
RDW: 13.6 % (ref 11.5–15.5)
WBC: 15 K/uL — ABNORMAL HIGH (ref 4.0–10.5)
nRBC: 0 % (ref 0.0–0.2)

## 2024-01-09 LAB — URINALYSIS, W/ REFLEX TO CULTURE (INFECTION SUSPECTED)
Bacteria, UA: NONE SEEN
Bilirubin Urine: NEGATIVE
Glucose, UA: NEGATIVE mg/dL
Hgb urine dipstick: NEGATIVE
Ketones, ur: NEGATIVE mg/dL
Leukocytes,Ua: NEGATIVE
Nitrite: NEGATIVE
Protein, ur: NEGATIVE mg/dL
Specific Gravity, Urine: >1.046 — ABNORMAL HIGH (ref 1.005–1.030)
pH: 5 (ref 5.0–8.0)

## 2024-01-09 LAB — PROTIME-INR
INR: 1 (ref 0.8–1.2)
Prothrombin Time: 13.8 s (ref 11.4–15.2)

## 2024-01-09 LAB — RESP PANEL BY RT-PCR (RSV, FLU A&B, COVID)  RVPGX2
Influenza A by PCR: NEGATIVE
Influenza B by PCR: NEGATIVE
Resp Syncytial Virus by PCR: NEGATIVE
SARS Coronavirus 2 by RT PCR: POSITIVE — AB

## 2024-01-09 LAB — CK: Total CK: 94 U/L (ref 49–397)

## 2024-01-09 LAB — I-STAT CG4 LACTIC ACID, ED: Lactic Acid, Venous: 1.8 mmol/L (ref 0.5–1.9)

## 2024-01-09 LAB — CBC
HCT: 36.6 % — ABNORMAL LOW (ref 39.0–52.0)
Hemoglobin: 11.9 g/dL — ABNORMAL LOW (ref 13.0–17.0)
MCH: 29.5 pg (ref 26.0–34.0)
MCHC: 32.5 g/dL (ref 30.0–36.0)
MCV: 90.8 fL (ref 80.0–100.0)
Platelets: 92 K/uL — ABNORMAL LOW (ref 150–400)
RBC: 4.03 MIL/uL — ABNORMAL LOW (ref 4.22–5.81)
RDW: 13.3 % (ref 11.5–15.5)
WBC: 13.1 K/uL — ABNORMAL HIGH (ref 4.0–10.5)
nRBC: 0 % (ref 0.0–0.2)

## 2024-01-09 LAB — TSH: TSH: 3.181 u[IU]/mL (ref 0.350–4.500)

## 2024-01-09 LAB — ETHANOL: Alcohol, Ethyl (B): <15 mg/dL (ref <15)

## 2024-01-09 LAB — CREATININE, SERUM
Creatinine, Ser: 0.88 mg/dL (ref 0.61–1.24)
GFR, Estimated: >60 mL/min (ref >60)

## 2024-01-09 LAB — PROCALCITONIN: Procalcitonin: 0.37 ng/mL

## 2024-01-09 LAB — LIPASE, BLOOD: Lipase: 52 U/L — ABNORMAL HIGH (ref 11–51)

## 2024-01-09 LAB — AMMONIA: Ammonia: 34 umol/L (ref 9–35)

## 2024-01-09 MED ORDER — TRAMADOL HCL 50 MG PO TABS
50.0000 mg | ORAL_TABLET | Freq: Three times a day (TID) | ORAL | Status: DC | PRN
  Administered 2024-01-09 – 2024-01-10 (×2): 50 mg via ORAL
  Filled 2024-01-09 (×2): qty 1

## 2024-01-09 MED ORDER — LEVETIRACETAM (KEPPRA) 500 MG/5 ML ADULT IV PUSH
1000.0000 mg | Freq: Once | INTRAVENOUS | Status: AC
  Administered 2024-01-09: 1000 mg via INTRAVENOUS
  Filled 2024-01-09: qty 10

## 2024-01-09 MED ORDER — ONDANSETRON HCL 4 MG/2ML IJ SOLN
4.0000 mg | Freq: Four times a day (QID) | INTRAMUSCULAR | Status: DC | PRN
Start: 2024-01-09 — End: 2024-01-13

## 2024-01-09 MED ORDER — FENTANYL CITRATE PF 50 MCG/ML IJ SOSY
50.0000 ug | PREFILLED_SYRINGE | Freq: Once | INTRAMUSCULAR | Status: AC
  Administered 2024-01-09: 50 ug via INTRAVENOUS
  Filled 2024-01-09: qty 1

## 2024-01-09 MED ORDER — ARFORMOTEROL TARTRATE 15 MCG/2ML IN NEBU
15.0000 ug | INHALATION_SOLUTION | Freq: Two times a day (BID) | RESPIRATORY_TRACT | Status: DC
  Administered 2024-01-09 – 2024-01-13 (×7): 15 ug via RESPIRATORY_TRACT
  Filled 2024-01-09 (×8): qty 2

## 2024-01-09 MED ORDER — THIAMINE HCL 100 MG/ML IJ SOLN
100.0000 mg | Freq: Every day | INTRAMUSCULAR | Status: DC
  Administered 2024-01-10: 100 mg via INTRAVENOUS
  Filled 2024-01-09 (×2): qty 2

## 2024-01-09 MED ORDER — ORAL CARE MOUTH RINSE: 15.0000 mL | OROMUCOSAL | Status: DC | PRN

## 2024-01-09 MED ORDER — SODIUM CHLORIDE 0.9 % IV BOLUS
1000.0000 mL | Freq: Once | INTRAVENOUS | Status: AC
  Administered 2024-01-09: 1000 mL via INTRAVENOUS

## 2024-01-09 MED ORDER — SODIUM CHLORIDE 0.9 % IV SOLN: INTRAVENOUS | Status: AC

## 2024-01-09 MED ORDER — IPRATROPIUM-ALBUTEROL 0.5-2.5 (3) MG/3ML IN SOLN
3.0000 mL | Freq: Four times a day (QID) | RESPIRATORY_TRACT | Status: DC
  Administered 2024-01-09: 3 mL via RESPIRATORY_TRACT
  Filled 2024-01-09: qty 3

## 2024-01-09 MED ORDER — LORAZEPAM 2 MG/ML IJ SOLN: 1.0000 mg | INTRAMUSCULAR | Status: AC | PRN

## 2024-01-09 MED ORDER — NICOTINE 21 MG/24HR TD PT24
21.0000 mg | MEDICATED_PATCH | Freq: Every day | TRANSDERMAL | Status: DC
  Administered 2024-01-09 – 2024-01-13 (×5): 21 mg via TRANSDERMAL
  Filled 2024-01-09 (×5): qty 1

## 2024-01-09 MED ORDER — ENOXAPARIN SODIUM 40 MG/0.4ML IJ SOSY
40.0000 mg | PREFILLED_SYRINGE | INTRAMUSCULAR | Status: DC
  Administered 2024-01-09: 40 mg via SUBCUTANEOUS
  Filled 2024-01-09: qty 0.4

## 2024-01-09 MED ORDER — SODIUM CHLORIDE 0.9 % IV SOLN
3.0000 g | Freq: Four times a day (QID) | INTRAVENOUS | Status: DC
  Administered 2024-01-09 – 2024-01-13 (×15): 3 g via INTRAVENOUS
  Filled 2024-01-09 (×15): qty 8

## 2024-01-09 MED ORDER — DOCUSATE SODIUM 100 MG PO CAPS
100.0000 mg | ORAL_CAPSULE | Freq: Two times a day (BID) | ORAL | Status: DC
  Administered 2024-01-09 – 2024-01-13 (×6): 100 mg via ORAL
  Filled 2024-01-09 (×8): qty 1

## 2024-01-09 MED ORDER — BUDESONIDE 0.25 MG/2ML IN SUSP
0.2500 mg | Freq: Two times a day (BID) | RESPIRATORY_TRACT | Status: DC
  Administered 2024-01-09 – 2024-01-13 (×7): 0.25 mg via RESPIRATORY_TRACT
  Filled 2024-01-09 (×8): qty 2

## 2024-01-09 MED ORDER — THIAMINE MONONITRATE 100 MG PO TABS
100.0000 mg | ORAL_TABLET | Freq: Every day | ORAL | Status: DC
  Administered 2024-01-09 – 2024-01-13 (×4): 100 mg via ORAL
  Filled 2024-01-09 (×4): qty 1

## 2024-01-09 MED ORDER — ONDANSETRON HCL 4 MG PO TABS: 4.0000 mg | ORAL_TABLET | Freq: Four times a day (QID) | ORAL | Status: DC | PRN

## 2024-01-09 MED ORDER — SENNOSIDES-DOCUSATE SODIUM 8.6-50 MG PO TABS
1.0000 | ORAL_TABLET | Freq: Two times a day (BID) | ORAL | Status: DC
  Administered 2024-01-09 – 2024-01-13 (×6): 1 via ORAL
  Filled 2024-01-09 (×7): qty 1

## 2024-01-09 MED ORDER — IOHEXOL 350 MG/ML SOLN
75.0000 mL | Freq: Once | INTRAVENOUS | Status: AC | PRN
  Administered 2024-01-09: 75 mL via INTRAVENOUS

## 2024-01-09 MED ORDER — LORAZEPAM 1 MG PO TABS: 1.0000 mg | ORAL_TABLET | ORAL | Status: AC | PRN

## 2024-01-09 MED ORDER — POLYETHYLENE GLYCOL 3350 17 G PO PACK
17.0000 g | PACK | Freq: Every day | ORAL | Status: DC | PRN
  Administered 2024-01-10: 17 g via ORAL
  Filled 2024-01-09: qty 1

## 2024-01-09 MED ORDER — FOLIC ACID 1 MG PO TABS
1.0000 mg | ORAL_TABLET | Freq: Every day | ORAL | Status: DC
  Administered 2024-01-09 – 2024-01-13 (×5): 1 mg via ORAL
  Filled 2024-01-09 (×5): qty 1

## 2024-01-09 MED ORDER — ADULT MULTIVITAMIN W/MINERALS CH
1.0000 | ORAL_TABLET | Freq: Every day | ORAL | Status: DC
  Administered 2024-01-09 – 2024-01-13 (×5): 1 via ORAL
  Filled 2024-01-09 (×5): qty 1

## 2024-01-09 NOTE — ED Notes (Signed)
 CCMD called.

## 2024-01-09 NOTE — Progress Notes (Signed)
 EEG complete - results pending

## 2024-01-09 NOTE — ED Notes (Signed)
 Courtesy call provided to 6N.

## 2024-01-09 NOTE — ED Provider Notes (Signed)
 [place] EMERGENCY DEPARTMENT AT [facility] Provider Note   CSN: [id number] Arrival date & time: 01/09/24  1132     Patient presents with: Neurologic Problem   Lonnie Blair is a 73 y.o. male.   The history is provided by the patient, medical records and a relative. The history is limited by the condition of the patient. No language interpreter was used.  Neurologic Problem This is a new problem. The current episode started more than 1 week ago. The problem occurs constantly. The problem has not changed since onset.Associated symptoms include abdominal pain. Pertinent negatives include no chest pain, no headaches and no shortness of breath. Nothing aggravates the symptoms. Nothing relieves the symptoms. He has tried nothing for the symptoms. The treatment provided no relief.       Prior to Admission medications   Medication Sig Start Date End Date Taking? Authorizing Provider  lactose free nutrition (BOOST PLUS) LIQD Take 237 mLs by mouth See admin instructions. Drink 237 ml's of Strawberry Boost Plus by mouth two times a day    [provider]  zonisamide  (ZONEGRAN ) 100 MG capsule Take 100 mg by mouth in the morning and at bedtime.    [provider]    Allergies: Tylenol  [acetaminophen ]    Review of Systems  Constitutional:  Positive for fatigue. Negative for chills and fever.  HENT:  Negative for congestion.   Eyes:  Negative for visual disturbance.  Respiratory:  Positive for cough. Negative for chest tightness, shortness of breath and wheezing.   Cardiovascular:  Negative for chest pain, palpitations and leg swelling.  Gastrointestinal:  Positive for abdominal pain. Negative for constipation, diarrhea, nausea and vomiting.  Genitourinary:  Negative for dysuria and flank pain.  Musculoskeletal:  Negative for back pain, neck pain and neck stiffness.  Skin:  Negative for rash.  Neurological:  Positive for light-headedness. Negative for  headaches.  Psychiatric/Behavioral:  Positive for confusion. Negative for agitation.     Updated Vital Signs BP 121/86   Pulse 79   Temp 97.7 F (36.5 C) (Rectal)   Resp 20   Ht 5' 6 (1.676 m)   Wt 48 kg   SpO2 99%   BMI 17.08 kg/m   Physical Exam Vitals and nursing note reviewed.  Constitutional:      General: He is not in acute distress.    Appearance: He is well-developed. He is ill-appearing. He is not toxic-appearing or diaphoretic.  HENT:     Head: Normocephalic and atraumatic.     Nose: No congestion or rhinorrhea.     Mouth/Throat:     Mouth: Mucous membranes are dry.     Pharynx: No oropharyngeal exudate or posterior oropharyngeal erythema.  Eyes:     Extraocular Movements: Extraocular movements intact.     Conjunctiva/sclera: Conjunctivae normal.     Pupils: Pupils are equal, round, and reactive to light.  Cardiovascular:     Rate and Rhythm: Normal rate and regular rhythm.     Heart sounds: No murmur heard. Pulmonary:     Effort: Pulmonary effort is normal. No respiratory distress.     Breath sounds: Rhonchi present. No wheezing or rales.  Chest:     Chest wall: No tenderness.  Abdominal:     General: Abdomen is flat.     Palpations: Abdomen is soft.     Tenderness: There is abdominal tenderness. There is no right CVA tenderness, left CVA tenderness, guarding or rebound.  Musculoskeletal:  General: No swelling or tenderness.     Cervical back: Neck supple.  Skin:    General: Skin is warm and dry.     Capillary Refill: Capillary refill takes less than 2 seconds.     Findings: No erythema or rash.  Neurological:     General: No focal deficit present.     Mental Status: He is alert.     Sensory: No sensory deficit.  Psychiatric:        Mood and Affect: Mood normal.     (all labs ordered are listed, but only abnormal results are displayed) Labs Reviewed  RESP PANEL BY RT-PCR (RSV, FLU A&B, COVID)  RVPGX2 - Abnormal; Notable for the following  components:      Result Value   SARS Coronavirus 2 by RT PCR POSITIVE (*)    All other components within normal limits  CBC WITH DIFFERENTIAL/PLATELET - Abnormal; Notable for the following components:   WBC 15.0 (*)    RBC 3.88 (*)    Hemoglobin 11.5 (*)    HCT 35.4 (*)    Platelets 121 (*)    Neutro Abs 11.3 (*)    Monocytes Absolute 1.1 (*)    Abs Immature Granulocytes 0.08 (*)    All other components within normal limits  COMPREHENSIVE METABOLIC PANEL WITH GFR - Abnormal; Notable for the following components:   Albumin 3.1 (*)    AST 52 (*)    All other components within normal limits  LIPASE, BLOOD - Abnormal; Notable for the following components:   Lipase 52 (*)    All other components within normal limits  CULTURE, BLOOD (ROUTINE X 2)  CULTURE, BLOOD (ROUTINE X 2)  AMMONIA  PROTIME-INR  CK  TSH  URINALYSIS, W/ REFLEX TO CULTURE (INFECTION SUSPECTED)  ETHANOL  I-STAT CG4 LACTIC ACID, ED    EKG: EKG Interpretation Date/Time:  Saturday January 09 2024 11:47:34 EDT Ventricular Rate:  79 PR Interval:  126 QRS Duration:  77 QT Interval:  377 QTC Calculation: 433 R Axis:   86  Text Interpretation: Sinus rhythm LAE, consider biatrial enlargement Borderline right axis deviation Minimal ST elevation, inferior leads when compared to prior, similar appearance No STEMI Confirmed by Ginger Barefoot ([id number]) on 01/09/2024 11:59:57 AM  Radiology: CT Angio Chest PE W and/or Wo Contrast Result Date: 01/09/2024 CLINICAL DATA:  Unilateral left-sided weakness beginning over 1 week ago. Unable to move left arm or leg. Also with cough. Patient also with abdominal pain. EXAM: CT ANGIOGRAPHY CHEST CT ABDOMEN AND PELVIS WITH CONTRAST TECHNIQUE: Multidetector CT imaging of the chest was performed using the standard protocol during bolus administration of intravenous contrast. Multiplanar CT image reconstructions and MIPs were obtained to evaluate the vascular anatomy. Multidetector CT imaging of  the abdomen and pelvis was performed using the standard protocol during bolus administration of intravenous contrast. RADIATION DOSE REDUCTION: This exam was performed according to the departmental dose-optimization program which includes automated exposure control, adjustment of the mA and/or kV according to patient size and/or use of iterative reconstruction technique. CONTRAST:  75mL OMNIPAQUE  IOHEXOL  350 MG/ML SOLN COMPARISON:  01/01/2023. FINDINGS: CTA CHEST FINDINGS Cardiovascular: Pulmonary arteries are well opacified. There is no evidence of a pulmonary embolism. Heart is normal in size. No pericardial effusion. Thoracic aorta is normal in caliber. There is aortic atherosclerosis, but no dissection. Mediastinum/Nodes: No neck base, mediastinal or hilar masses. No enlarged lymph nodes. Trachea esophagus are unremarkable. Lungs/Pleura: There is a spiculated nodule that abuts the overlying fluoro,  associated with pleural thickening. Nodule measures 11 mm in greatest axial dimension and 1.4 cm in greatest dimension on the coronal sequence. This nodule has increased in size from the prior CT. And is centered on image 19, series 7. Small spiculated nodule, anterior left upper lobe, image 34, series 7, 7 x 4 mm, mean 5.5 mm, increased in size from the prior CT. 5 mm nodule, superior segment of the left lower lobe, image 28, series 7, abuts the oblique fissure. There is adjacent nodular pleural thickening of the upper aspect of the left oblique fissure. This is similar to the prior CT. Advanced centrilobular emphysema. Stable air cyst, right lower lobe. No lung consolidation or evidence of edema. No pleural effusion or pneumothorax. Musculoskeletal: No fracture or acute finding. No bone lesion. No chest wall mass. Review of the MIP images confirms the above findings. CT ABDOMEN and PELVIS FINDINGS Hepatobiliary: Liver normal in size and overall attenuation. There are numerous small hypoattenuating masses, largest at  the dome of the right lobe, 1.3 cm. Findings are consistent with metastatic disease. Gallbladder is unremarkable. No bile duct dilation. Pancreas: Pancreatic atrophy and irregular duct dilation. Both findings have progressed since the prior CT. Duct with a maximum diameter of 8 mm. Calcifications lie adjacent to the distal pancreatic and common bile duct in the pancreatic head new since the prior CT. No defined pancreatic mass. There is ill-defined soft tissue adjacent to the common bile duct along the porta hepatis. No evidence of pancreatic inflammation. Spleen: Normal in size without focal abnormality. Adrenals/Urinary Tract: No adrenal mass. Kidneys normal in overall size and position with symmetric enhancement. There are wedge-shaped areas of hypoattenuation, anterolateral left kidney, mid to lower pole and posterior upper pole the right kidney, new since the prior CT, consistent with infarcts. No defined renal mass. No hydronephrosis. Ureters are grossly normal in course and in caliber. Bladder is unremarkable. Stomach/Bowel: Stomach is mostly decompressed, otherwise unremarkable. Grossly normal appearance of the small bowel. Right colon is mildly distended up to 5 cm. There is no wall thickening or inflammation. Vascular/Lymphatic: Prominent and mildly enlarged shoddy lymph nodes in the upper abdomen, gastrohepatic ligament, Peri celiac and aortocaval. Gastrohepatic ligament node measures largest, 1.4 cm. Nodes have increased when compared to the prior CT. Dense aortoiliac atherosclerotic calcification. No aneurysm. Reproductive: Mild stable prostate enlargement. Other: No abdominal wall hernia.  No ascites. Musculoskeletal: No fracture or acute finding. No osteoblastic or osteolytic lesions. Review of the MIP images confirms the above findings. IMPRESSION: CTA CHEST 1. No evidence of a pulmonary embolism. 2. No acute findings. 3. 2 left upper lobe pulmonary nodules, both spiculated appearance and suspicious,  largest abutting the anterior pleural surface measuring 1.4 cm in greatest dimension. Both nodules have increased in size from the previous CT and are concerning for primary lung carcinoma. 4. Advanced emphysema.  Aortic atherosclerosis. CT ABDOMEN AND PELVIS 1. No acute findings. 2. Multiple low-attenuation liver masses consistent with metastatic disease, new from the prior CT. There are also enlarged lymph nodes in the upper abdomen, largest along the gastrohepatic ligament, consistent with metastatic disease. History provided for the current head CT reports pancreatic cancer. 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 3. No defined pancreatic mass, although there is irregular dilation of the pancreatic duct that has increased when compared to the prior CT. 4. Dense aortic atherosclerosis. 5. Wedge-shaped hypoattenuating areas in both kidneys, new since the prior CT, suspected to be infarcts. Electronically Signed   By: Alm Parkins  M.D.   On: 01/09/2024 14:53   CT ABDOMEN PELVIS W CONTRAST Result Date: 01/09/2024 CLINICAL DATA:  Unilateral left-sided weakness beginning over 1 week ago. Unable to move left arm or leg. Also with cough. Patient also with abdominal pain. EXAM: CT ANGIOGRAPHY CHEST CT ABDOMEN AND PELVIS WITH CONTRAST TECHNIQUE: Multidetector CT imaging of the chest was performed using the standard protocol during bolus administration of intravenous contrast. Multiplanar CT image reconstructions and MIPs were obtained to evaluate the vascular anatomy. Multidetector CT imaging of the abdomen and pelvis was performed using the standard protocol during bolus administration of intravenous contrast. RADIATION DOSE REDUCTION: This exam was performed according to the departmental dose-optimization program which includes automated exposure control, adjustment of the mA and/or kV according to patient size and/or use of iterative reconstruction technique. CONTRAST:  75mL OMNIPAQUE  IOHEXOL   350 MG/ML SOLN COMPARISON:  01/01/2023. FINDINGS: CTA CHEST FINDINGS Cardiovascular: Pulmonary arteries are well opacified. There is no evidence of a pulmonary embolism. Heart is normal in size. No pericardial effusion. Thoracic aorta is normal in caliber. There is aortic atherosclerosis, but no dissection. Mediastinum/Nodes: No neck base, mediastinal or hilar masses. No enlarged lymph nodes. Trachea esophagus are unremarkable. Lungs/Pleura: There is a spiculated nodule that abuts the overlying fluoro, associated with pleural thickening. Nodule measures 11 mm in greatest axial dimension and 1.4 cm in greatest dimension on the coronal sequence. This nodule has increased in size from the prior CT. And is centered on image 19, series 7. Small spiculated nodule, anterior left upper lobe, image 34, series 7, 7 x 4 mm, mean 5.5 mm, increased in size from the prior CT. 5 mm nodule, superior segment of the left lower lobe, image 28, series 7, abuts the oblique fissure. There is adjacent nodular pleural thickening of the upper aspect of the left oblique fissure. This is similar to the prior CT. Advanced centrilobular emphysema. Stable air cyst, right lower lobe. No lung consolidation or evidence of edema. No pleural effusion or pneumothorax. Musculoskeletal: No fracture or acute finding. No bone lesion. No chest wall mass. Review of the MIP images confirms the above findings. CT ABDOMEN and PELVIS FINDINGS Hepatobiliary: Liver normal in size and overall attenuation. There are numerous small hypoattenuating masses, largest at the dome of the right lobe, 1.3 cm. Findings are consistent with metastatic disease. Gallbladder is unremarkable. No bile duct dilation. Pancreas: Pancreatic atrophy and irregular duct dilation. Both findings have progressed since the prior CT. Duct with a maximum diameter of 8 mm. Calcifications lie adjacent to the distal pancreatic and common bile duct in the pancreatic head new since the prior CT. No  defined pancreatic mass. There is ill-defined soft tissue adjacent to the common bile duct along the porta hepatis. No evidence of pancreatic inflammation. Spleen: Normal in size without focal abnormality. Adrenals/Urinary Tract: No adrenal mass. Kidneys normal in overall size and position with symmetric enhancement. There are wedge-shaped areas of hypoattenuation, anterolateral left kidney, mid to lower pole and posterior upper pole the right kidney, new since the prior CT, consistent with infarcts. No defined renal mass. No hydronephrosis. Ureters are grossly normal in course and in caliber. Bladder is unremarkable. Stomach/Bowel: Stomach is mostly decompressed, otherwise unremarkable. Grossly normal appearance of the small bowel. Right colon is mildly distended up to 5 cm. There is no wall thickening or inflammation. Vascular/Lymphatic: Prominent and mildly enlarged shoddy lymph nodes in the upper abdomen, gastrohepatic ligament, Peri celiac and aortocaval. Gastrohepatic ligament node measures largest, 1.4 cm. Nodes have increased  when compared to the prior CT. Dense aortoiliac atherosclerotic calcification. No aneurysm. Reproductive: Mild stable prostate enlargement. Other: No abdominal wall hernia.  No ascites. Musculoskeletal: No fracture or acute finding. No osteoblastic or osteolytic lesions. Review of the MIP images confirms the above findings. IMPRESSION: CTA CHEST 1. No evidence of a pulmonary embolism. 2. No acute findings. 3. 2 left upper lobe pulmonary nodules, both spiculated appearance and suspicious, largest abutting the anterior pleural surface measuring 1.4 cm in greatest dimension. Both nodules have increased in size from the previous CT and are concerning for primary lung carcinoma. 4. Advanced emphysema.  Aortic atherosclerosis. CT ABDOMEN AND PELVIS 1. No acute findings. 2. Multiple low-attenuation liver masses consistent with metastatic disease, new from the prior CT. There are also enlarged  lymph nodes in the upper abdomen, largest along the gastrohepatic ligament, consistent with metastatic disease. History provided for the current head CT reports pancreatic cancer. 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 3. No defined pancreatic mass, although there is irregular dilation of the pancreatic duct that has increased when compared to the prior CT. 4. Dense aortic atherosclerosis. 5. Wedge-shaped hypoattenuating areas in both kidneys, new since the prior CT, suspected to be infarcts. Electronically Signed   By: Alm Parkins M.D.   On: 01/09/2024 14:53   CT Head Wo Contrast Result Date: 01/09/2024 CLINICAL DATA:  Mental status change. Multiple seizures today. History of pancreatic carcinoma. EXAM: CT HEAD WITHOUT CONTRAST TECHNIQUE: Contiguous axial images were obtained from the base of the skull through the vertex without intravenous contrast. RADIATION DOSE REDUCTION: This exam was performed according to the departmental dose-optimization program which includes automated exposure control, adjustment of the mA and/or kV according to patient size and/or use of iterative reconstruction technique. COMPARISON:  12/26/2020. FINDINGS: Brain: No evidence of acute infarction, hemorrhage, hydrocephalus, extra-axial collection or mass lesion/mass effect. Vascular: No hyperdense vessel or unexpected calcification. Skull: Normal. Negative for fracture or focal lesion. Sinuses/Orbits: Globes and orbits are unremarkable. Visualized sinuses are clear. Other: None. IMPRESSION: No acute intracranial abnormalities. Electronically Signed   By: Alm Parkins M.D.   On: 01/09/2024 14:27   DG Elbow Complete Left Result Date: 01/09/2024 CLINICAL DATA:  Elbow pain. History of pancreatic cancer. No history of reported trauma EXAM: LEFT ELBOW - COMPLETE 2 VIEW COMPARISON:  None Available. FINDINGS: Hyperostosis along the olecranon. No fracture or dislocation. Grossly preserved joint spaces. No large  joint effusion on the lateral view is somewhat rotated limiting evaluation for subtle joint effusion. Repeat lateral film as clinically appropriate. IMPRESSION: Grossly no acute osseous abnormality.  Limited lateral view. Electronically Signed   By: Ranell Bring M.D.   On: 01/09/2024 12:39     Procedures   Medications Ordered in the ED - No data to display                                  Medical Decision Making Amount and/or Complexity of Data Reviewed Labs: ordered. Radiology: ordered.  Risk Prescription drug management. Decision regarding hospitalization.    Lonnie Blair is a 73 y.o. male with a past medical history significant for previous pancreatitis, seizure disorder on Zonegran , COPD, lung mass, and a family history report of pancreatic cancer who presents with 1 week of generalized weakness and altered mental status with illness.  According to family, everyone in the home a week and a half ago had a upper URI.  Patient also  had cough but has deteriorated over the last week.  He is taking less intake and is less active.  He is diffusely weak all over.  He can barely raise either his legs off the bed.  He says he has not had a bowel movement several days and has distended tender abdomen.  He is having coughing.  Per family he is somnolent and altered and this is different than him.  He had 2 seizures today witnessed by family which is different for him as well.  They deny any recent alcohol use in the patient or recent medication or supplement changes.  There was no reported fall or trauma.  Patient to me is denying headache or neck pain is denying chest pain but he is reporting abdominal pain and constipation.  He also reports feeling fatigued and tired.  He is having pain in his left elbow and pain in his left hip area.  Denies any other urinary changes.  On exam, patient is very cachectic and dehydrated appearing.  Keitha is a very dry.  Pupils are symmetric and reactive.  He  did appear to have symmetric smile.  He is answering some questions but is somnolent and falling asleep.  No evidence of acute trauma initially.  Chest is nontender but he does have rhonchi.  Abdomen diffusely tender and slightly distended.  I did hear some bowel sounds.  Flanks and back nontender on my exam.  He has some tenderness in his left hip but denies falls.  He was able to barely raise both legs off the bed and had symmetric sensation.  He has some pain in his left elbow but is not having any focal tenderness.  He is able to move it.  There do not appear to be unilateral weakness on my exam.  Clinically I am concerned with the patient's appearance.  I suspect that he have had a viral illness like the rest of the family last week his body may not been able to handle it.  He appears very dehydrated will give fluids.  He was wanting some pain medicines will give some pain medicine for the abdomen.  Will get CT of the abdomen pelvis, get PE study given the cough and breath sounds with a cancer history and a mass to rule out pulm embolism as well.  Will get CT of the head initially.  Unclear if there is any brain metastasis.  With these more frequent seizures today, may touch base with neurology to decide if we need more advanced imaging such as MRI with and without contrast to look for metastasis.  Will get urinalysis and other labs for altered mental status.  Anticipate admission given the patient's appearance       3:59 PM Workup is again to return.  He is positive for COVID and I suspect this is one of the cause of his symptoms.  The CT imaging also is concerning for a possible new lung cancer as well as liver metastasis from a suspected pancreatic cancer.  Given the patient's ill appearance, fatigue, and altered mental status, do not feel he is safe for discharge home.  Will give more fluids and call for admission for further management of altered mental status in the setting of COVID and new  cancer.  4:08 PM Medicine requested neurology consultation given the 2 seizures today in the reported left-sided unilateral deficits with EMS that I did not see on exam.  I anticipate patient will likely need MRIs but we  will wait for neurology guidance for this.     Final diagnoses:  COVID-19  Fatigue, unspecified type  Liver masses  Lung mass  Dehydration      Clinical Impression: 1. COVID-19   2. Fatigue, unspecified type   3. Liver masses   4. Lung mass   5. Dehydration     Disposition: Admit  This note was prepared with assistance of Dragon voice recognition software. Occasional wrong-word or sound-a-like substitutions may have occurred due to the inherent limitations of voice recognition software.      Deklyn Gibbon, Lonni PARAS, MD 01/09/24 [phone]

## 2024-01-09 NOTE — Consult Note (Signed)
 NAME:  Lonnie Blair, MRN:  [id number], DOB:  09/30/1950, LOS: 0 ADMISSION DATE:  01/09/2024, CONSULTATION DATE: 01/09/24 REFERRING MD: TRH, CHIEF COMPLAINT: Unilateral weakness  History of Present Illness:  73 year old history of alcohol abuse, emphysema, tobacco abuse, lung nodules lost to follow-up admitted with transient weakness worked up for TIA versus stroke found to have enlarging lung nodules as well as concern for GI malignancy with liver lesions on CT scan whom were consulted for lung nodules.  He is withdrawn.  Difficult to engage in conversation.  Answers 1 word responses.  Unclear if encephalopathic related to what brought him into the hospital.  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  Per EMR, family reported left weakness that started reportedly about a week ago.  Prompting presentation today.  CT head without obvious bleed or stroke.  CT chest abdomen pelvis revealed enlarging left upper lobe nodules, no lymphadenopathy in the chest, numerous liver lesions and abdominal lymphadenopathy with possible pancreatic abnormality concerning for GI malignancy.  MRI is pending.  EEG is pending.  Labs notable for increasing creatinine from baseline for days prior.  Relatively stable elevation in LFTs 1 week prior.  Anemia slightly improved, mild leukocytosis.  He tested positive for COVID in the ED.  Pertinent  Medical History  As per EMR  Significant Hospital Events: Including procedures, antibiotic start and stop dates in addition to other pertinent events   7/19 admitted with weakness, resolved, CTA head okay, MRI pending, CT chest on pelvis reveals left upper lobe pulmonary nodules enlarging, no lymphadenopathy hilar or mediastinal, liver lesions abdominal lymphadenopathy and possible pancreatic abnormality  Interim History / Subjective:    Objective    Blood pressure (!) 147/85, pulse 81, temperature 97.9 F (36.6 C), temperature source Oral, resp. rate 16, height 5' 6 (1.676 m), weight  48 kg, SpO2 99%.        Intake/Output Summary (Last 24 hours) at 01/09/2024 1816 Last data filed at 01/09/2024 1135 Gross per 24 hour  Intake 500 ml  Output --  Net 500 ml   Filed Weights   01/09/24 1136  Weight: 48 kg    Examination: General: Lying in bed, cachectic HENT: Atraumatic normocephalic temporal wasting Lungs: Clear distant bilaterally Cardiovascular: Regular rate and rhythm Abdomen: Nondistended Extremities: No edema Neuro: Alert, oriented to self, a bit encephalopathic, slow to answer, one-word answers   Resolved problem list   Assessment and Plan   2 pulmonary nodules, enlarging, concerning for primary lung cancer: No mediastinal or hilar lymphadenopathy.  Enlarged over time compared to 2024.  Possibly metastatic skipping lymph nodes would be unusual, sometimes we get a skip lesion or left lower lobe nodule to left upper lobe nodule would be unusual. -- Would defer any bronchoscopic biopsy until central processes stroke etc. ruled out to minimize risk of anesthesia, frankly this could be pursued as an outpatient if needed -- If liver lesions consistent with primary abdominal cancer versus nondiagnostic, please contact us  and we can reevaluate inpatient versus outpatient sampling of these nodules  Multiple liver lesions: Suspect GI primary, possible pancreatic.  Lung metastases possible.  However would be unusual with no hide or mediastinal lymphadenopathy seen on CT scan. -- Recommend biopsy here, with appropriate staining can differentiate between GI versus lung origin and would provide staging as well as diagnosis.  Discussed my recommendation with the patient at bedside. -- MRI pending of brain to workup stroke versus TIA, high suspicion for possible brain metastases causing neurologic changes that prompted presentation  COVID: Suspect incidental: CT scan clear.  He is on room air.  Largely asymptomatic. -- Airborne precautions, supportive care  PCCM will sign  off  Best Practice (right click and Reselect all SmartList Selections daily)   Per primary  Labs   CBC: Recent Labs  Lab 01/09/24 1157  WBC 15.0*  NEUTROABS 11.3*  HGB 11.5*  HCT 35.4*  MCV 91.2  PLT 121*    Basic Metabolic Panel: Recent Labs  Lab 01/09/24 1157  NA 137  K 4.9  CL 100  CO2 25  GLUCOSE 84  BUN 19  CREATININE 1.03  CALCIUM  9.1   GFR: Estimated Creatinine Clearance: 43.4 mL/min (by C-G formula based on SCr of 1.03 mg/dL). Recent Labs  Lab 01/09/24 1157 01/09/24 1243  WBC 15.0*  --   LATICACIDVEN  --  1.8    Liver Function Tests: Recent Labs  Lab 01/09/24 1157  AST 52*  ALT 28  ALKPHOS 117  BILITOT 0.4  PROT 6.9  ALBUMIN 3.1*   Recent Labs  Lab 01/09/24 1157  LIPASE 52*   Recent Labs  Lab 01/09/24 1157  AMMONIA 34    ABG No results found for: PHART, PCO2ART, PO2ART, HCO3, TCO2, ACIDBASEDEF, O2SAT   Coagulation Profile: Recent Labs  Lab 01/09/24 1157  INR 1.0    Cardiac Enzymes: Recent Labs  Lab 01/09/24 1157  CKTOTAL 94    HbA1C: No results found for: HGBA1C  CBG: No results for input(s): GLUCAP in the last 168 hours.  Review of Systems:   Unable to obtain due to mild encephalopathy  Past Medical History:  He,  has a past medical history of Seizures (HCC).   Surgical History:   Past Surgical History:  Procedure Laterality Date   OTHER SURGICAL HISTORY  2010   nasal non cancerous tumor removed     Social History:   reports that he has been smoking. He does not have any smokeless tobacco history on file. He reports current alcohol use. He reports that he does not use drugs.   Family History:  His family history is not on file.   Allergies Allergies  Allergen Reactions   Tylenol  [Acetaminophen ] Itching     Home Medications  Prior to Admission medications   Medication Sig Start Date End Date Taking? Authorizing Provider  albuterol  (VENTOLIN  HFA) 108 (90 Base) MCG/ACT inhaler  Inhale 1-2 puffs into the lungs every 6 (six) hours as needed for wheezing or shortness of breath. 10/28/23  Yes [provider]  ipratropium-albuterol  (DUONEB) 0.5-2.5 (3) MG/3ML SOLN Take 3 mLs by nebulization every 6 (six) hours as needed. 10/28/23  Yes [provider]  lactose free nutrition (BOOST PLUS) LIQD Take 237 mLs by mouth once a week. Drink 237 ml's of Strawberry Boost Plus   Yes [provider]  zonisamide  (ZONEGRAN ) 100 MG capsule Take 100 mg by mouth in the morning and at bedtime.   Yes [provider]  NICOTINE  TD Place 1 patch onto the skin daily. Patient not taking: Reported on 01/09/2024    [provider]     Critical care time: n/a    Donnice JONELLE Beals, MD See TRACEY

## 2024-01-09 NOTE — H&P (Signed)
 History and Physical  Patient: Lonnie Blair FMW:[id number] DOB: [birth year] DOA: 01/09/2024 DOS: the patient was seen and examined on 01/09/2024 Patient coming from: Home  Chief Complaint:  Chief Complaint  Patient presents with   Neurologic Problem   HPI: Lonnie Blair is a 73 y.o. male with PMH significant of seizures on Zonegran , nicotine  abuse, alcohol use, previous pancreatitis, COPD presented with 1 week of generalized weakness, shortness of breath, coughing, confusion.  Per family he was out of it with this illness, very weak, had 2 seizures, 1 last night witnessed by his family.  He also reported nausea vomiting and abdominal pain, last BM on Thursday, 3 days ago.  No reported fall or trauma.  Family also reported fever 104 F 2 days ago, very tired and somnolent.  Per wife, they called EMS today and was noted to have acute left-sided weakness.  No prior history of stroke. Per family, they have had a viral illness in the last week.   Smokes half pack per day, drinks 2 beers every other day, ambulates at baseline without any difficulty. Family also reported previous mention of pancreatic carcinoma last year  however have not seen oncology  ED course:  In ED, temp 97.7 F, RR 17-25, pulse 74, BP 122/79, O2 sats 100% on room air In ED showed lipase 52, AST 52, ALT 28 CBC showed WBCs 15.0, hemoglobin 11.5, platelets 121, CK 94, lactic acid 1.8  CTA chest showed no PE, 2 left upper lobe pulmonary nodules, spiculated appearance, largest abutting the anterior pleural space measuring 1.4 cm, both nodules have increased in size from the previous CT and concerning for primary lung carcinoma, advanced emphysema  CT abdomen pelvis showed multiple liver masses consistent with metastatic disease, new from prior CT.  There are also enlarged lymph nodes in the upper abdomen, largest along the gastrohepatic ligament consistent with metastatic disease, consistent with metastatic pancreatic CA, no  defined pancreatic mass although there is a irregular dilation of the pancreatic duct that has increased when compared to the prior CT.  Wedge-shaped hypoattenuating areas in both kidneys, new since the prior CT suspected to be infarcts    Review of Systems: As mentioned in the history of present illness. All other systems reviewed and are negative. Past Medical History:  Diagnosis Date   Seizures [facility])    Past Surgical History:  Procedure Laterality Date   OTHER SURGICAL HISTORY  2010   nasal non cancerous tumor removed   Social History:  reports that he has been smoking. He does not have any smokeless tobacco history on file. He reports current alcohol use. He reports that he does not use drugs. Allergies  Allergen Reactions   Tylenol  [Acetaminophen ] Itching   History reviewed. No pertinent family history. Prior to Admission medications   Medication Sig Start Date End Date Taking? Authorizing Provider  lactose free nutrition (BOOST PLUS) LIQD Take 237 mLs by mouth See admin instructions. Drink 237 ml's of Strawberry Boost Plus by mouth two times a day    [provider]  zonisamide  (ZONEGRAN ) 100 MG capsule Take 100 mg by mouth in the morning and at bedtime.    [provider]   Physical Exam: Vitals:   01/09/24 1200 01/09/24 1400 01/09/24 1515 01/09/24 1600  BP: 122/79 (!) 147/94 121/86 (!) 136/99  Pulse: 74 81 79 93  Resp: 17 (!) 26 20 (!) 24  Temp:      TempSrc:      SpO2: 100% 97% 99% 100%  Weight:      Height:         General: Alert, awake, oriented x3, NAD, cachectic, ill-appearing Eyes: pink conjunctiva, anicteric sclera, PERLA, dry mucous membranes HEENT: normocephalic, atraumatic, oropharynx clear Neck: supple, no masses or lymphadenopathy, no JVD CVS: Regular rate and rhythm, no murmurs, rubs or gallops. Resp : Coarse rhonchi bilaterally GI : Soft, diffuse TTP , nondistended, positive bowel sounds. No hepatomegaly.  Ext: No lower extremity  edema  Musculoskeletal: No clubbing or cyanosis, positive pedal pulses. No contracture. ROM intact  Neuro: Grossly intact, no focal neurological deficits noted, strength 5/5 upper and lower extremities bilaterally Psych: alert and oriented x 3, normal mood and affect Skin: no rashes or lesions, warm and dry   Data Reviewed: I have reviewed ED notes, Vitals, Lab results and outpatient records.   Recent Labs  Lab 01/09/24 1157  NA 137  K 4.9  CL 100  CO2 25  GLUCOSE 84  BUN 19  CREATININE 1.03  CALCIUM  9.1   Recent Labs  Lab 01/09/24 1157  WBC 15.0*  NEUTROABS 11.3*  HGB 11.5*  HCT 35.4*  MCV 91.2  PLT 121*    Assessment and Plan Principal Problem: Acute left-sided weakness, seizure - Per family, patient had left-sided weakness noted this morning, had 2 seizures prior to admission - With the new findings of metastatic disease on CT chest abdomen pelvis, will order MRI of the brain with and without contrast to rule out any brain mets -  Patient has received 1 dose of Keppra  IV in ED, will await neurology recommendations regarding seizure meds.   - Obtain EEG, placed on seizure precautions - Stroke workup if MRI is positive.  Currently no FND's or left-sided weakness noted.   Active Problems: Metastatic disease with lung mass, pancreatic mass, liver lesions -Per patient's family, there has been mention of pancreatic cancer in the past however they have not seen oncology.  - Discussed with pulmonology, Dr. Annella, recommended liver biopsy which will be more accessible - IR consult placed for biopsy, consulted oncology, Dr. Sherrod to see  COVID 19 viral infection, COPD exacerbation - Ongoing symptoms for last 1 week, not be a candidate for antiviral now.  However, he is quite dehydrated, frail and cachectic, will place on IV fluids, symptomatic management. - Continue gentle IV fluid hydration, placed on scheduled DuoNebs, Pulmicort , Brovana .   - Hold off on steroids  as no hypoxia. - Given nausea and vomiting, leukocytosis, coarse rhonchi, concerned about possible aspiration PNA or superimposed bacterial PNA - Placed on IV Unasyn , obtain procalcitonin, blood cultures   Nicotine  abuse - Counseled on smoking cessation, placed on nicotine  patch  Nausea vomiting, abdominal pain, constipation - CT abdomen did not show any SBO or ileus, however has metastatic disease with possibility of pancreatic CA - Supportive care with antiemetics, bowel regimen   Generalized debility - PT OT evaluation  History of alcohol use -Placed on CIWA protocol with Ativan  -Thiamine , folate, MVI    Advance Care Planning:   Code Status: Full Code discussed with the patient and family Consults: Pulmonology, IR, neurology, oncology Family Communication: Management and plan discussed with patient's wife and son at the bedside Severity of Illness:      The appropriate patient status for this patient is INPATIENT. Inpatient status is judged to be reasonable and necessary in order to provide the required intensity of service to ensure the patient's safety. The patient's presenting symptoms, physical exam findings, and initial radiographic and laboratory  data in the context of their chronic comorbidities is felt to place them at high risk for further clinical deterioration. Furthermore, it is not anticipated that the patient will be medically stable for discharge from the hospital within 2 midnights of admission.   * I certify that at the point of admission it is my clinical judgment that the patient will require inpatient hospital care spanning beyond 2 midnights from the point of admission due to high intensity of service, high risk for further deterioration and high frequency of surveillance required.*    Author: Nydia Distance, MD 01/09/2024 4:56 PM For on call review [email or web address].

## 2024-01-09 NOTE — ED Triage Notes (Signed)
 Pt BIB EMS from home. Family reports left unilateral weakness that started the 11th. Pt was unable to move left arm or leg. Fully mobile prior. Also presents with cough. A&Ox3

## 2024-01-10 ENCOUNTER — Other Ambulatory Visit (HOSPITAL_COMMUNITY)

## 2024-01-10 ENCOUNTER — Encounter (HOSPITAL_COMMUNITY)

## 2024-01-10 ENCOUNTER — Inpatient Hospital Stay (HOSPITAL_COMMUNITY)

## 2024-01-10 DIAGNOSIS — I639 Cerebral infarction, unspecified: Secondary | ICD-10-CM

## 2024-01-10 DIAGNOSIS — D6869 Other thrombophilia: Secondary | ICD-10-CM

## 2024-01-10 DIAGNOSIS — R160 Hepatomegaly, not elsewhere classified: Secondary | ICD-10-CM

## 2024-01-10 DIAGNOSIS — U071 COVID-19: Secondary | ICD-10-CM | POA: Diagnosis not present

## 2024-01-10 DIAGNOSIS — R569 Unspecified convulsions: Secondary | ICD-10-CM

## 2024-01-10 DIAGNOSIS — C801 Malignant (primary) neoplasm, unspecified: Secondary | ICD-10-CM

## 2024-01-10 DIAGNOSIS — R29700 NIHSS score 0: Secondary | ICD-10-CM

## 2024-01-10 DIAGNOSIS — F17200 Nicotine dependence, unspecified, uncomplicated: Secondary | ICD-10-CM

## 2024-01-10 DIAGNOSIS — I6340 Cerebral infarction due to embolism of unspecified cerebral artery: Secondary | ICD-10-CM | POA: Diagnosis not present

## 2024-01-10 DIAGNOSIS — C259 Malignant neoplasm of pancreas, unspecified: Secondary | ICD-10-CM

## 2024-01-10 DIAGNOSIS — R531 Weakness: Secondary | ICD-10-CM | POA: Diagnosis not present

## 2024-01-10 DIAGNOSIS — R5383 Other fatigue: Secondary | ICD-10-CM | POA: Diagnosis not present

## 2024-01-10 DIAGNOSIS — E785 Hyperlipidemia, unspecified: Secondary | ICD-10-CM

## 2024-01-10 LAB — COMPREHENSIVE METABOLIC PANEL WITH GFR
ALT: 25 U/L (ref 0–44)
AST: 48 U/L — ABNORMAL HIGH (ref 15–41)
Albumin: 2.9 g/dL — ABNORMAL LOW (ref 3.5–5.0)
Alkaline Phosphatase: 121 U/L (ref 38–126)
Anion gap: 14 (ref 5–15)
BUN: 11 mg/dL (ref 8–23)
CO2: 20 mmol/L — ABNORMAL LOW (ref 22–32)
Calcium: 8.9 mg/dL (ref 8.9–10.3)
Chloride: 103 mmol/L (ref 98–111)
Creatinine, Ser: 0.8 mg/dL (ref 0.61–1.24)
GFR, Estimated: >60 mL/min (ref >60)
Glucose, Bld: 73 mg/dL (ref 70–99)
Potassium: 3.6 mmol/L (ref 3.5–5.1)
Sodium: 137 mmol/L (ref 135–145)
Total Bilirubin: 0.6 mg/dL (ref 0.0–1.2)
Total Protein: 6.8 g/dL (ref 6.5–8.1)

## 2024-01-10 LAB — RAPID URINE DRUG SCREEN, HOSP PERFORMED
Amphetamines: NOT DETECTED
Barbiturates: NOT DETECTED
Benzodiazepines: NOT DETECTED
Cocaine: NOT DETECTED
Opiates: NOT DETECTED
Tetrahydrocannabinol: NOT DETECTED

## 2024-01-10 LAB — CBC
HCT: 37.5 % — ABNORMAL LOW (ref 39.0–52.0)
Hemoglobin: 12.1 g/dL — ABNORMAL LOW (ref 13.0–17.0)
MCH: 29.4 pg (ref 26.0–34.0)
MCHC: 32.3 g/dL (ref 30.0–36.0)
MCV: 91 fL (ref 80.0–100.0)
Platelets: 91 K/uL — ABNORMAL LOW (ref 150–400)
RBC: 4.12 MIL/uL — ABNORMAL LOW (ref 4.22–5.81)
RDW: 13.3 % (ref 11.5–15.5)
WBC: 12.4 K/uL — ABNORMAL HIGH (ref 4.0–10.5)
nRBC: 0 % (ref 0.0–0.2)

## 2024-01-10 LAB — LIPID PANEL
Cholesterol: 179 mg/dL (ref 0–200)
HDL: 61 mg/dL (ref >40)
LDL Cholesterol: 92 mg/dL (ref 0–99)
Total CHOL/HDL Ratio: 2.9 ratio
Triglycerides: 129 mg/dL (ref <150)
VLDL: 26 mg/dL (ref 0–40)

## 2024-01-10 LAB — HEPARIN LEVEL (UNFRACTIONATED): Heparin Unfractionated: 0.17 [IU]/mL — ABNORMAL LOW (ref 0.30–0.70)

## 2024-01-10 LAB — HEMOGLOBIN A1C
Hgb A1c MFr Bld: 5.7 % — ABNORMAL HIGH (ref 4.8–5.6)
Mean Plasma Glucose: 116.89 mg/dL

## 2024-01-10 MED ORDER — IOHEXOL 350 MG/ML SOLN
75.0000 mL | Freq: Once | INTRAVENOUS | Status: AC | PRN
  Administered 2024-01-10: 75 mL via INTRAVENOUS

## 2024-01-10 MED ORDER — ATORVASTATIN CALCIUM 40 MG PO TABS
40.0000 mg | ORAL_TABLET | Freq: Every day | ORAL | Status: DC
  Administered 2024-01-10 – 2024-01-13 (×4): 40 mg via ORAL
  Filled 2024-01-10 (×4): qty 1

## 2024-01-10 MED ORDER — HEPARIN (PORCINE) 25000 UT/250ML-% IV SOLN
750.0000 [IU]/h | INTRAVENOUS | Status: DC
  Administered 2024-01-10: 600 [IU]/h via INTRAVENOUS
  Administered 2024-01-11: 750 [IU]/h via INTRAVENOUS
  Filled 2024-01-10 (×2): qty 250

## 2024-01-10 MED ORDER — ZONISAMIDE 100 MG PO CAPS
100.0000 mg | ORAL_CAPSULE | Freq: Two times a day (BID) | ORAL | Status: DC
  Filled 2024-01-10: qty 1

## 2024-01-10 MED ORDER — ASPIRIN 325 MG PO TABS: 325.0000 mg | ORAL_TABLET | Freq: Every day | ORAL | Status: DC

## 2024-01-10 MED ORDER — ZONISAMIDE 100 MG PO CAPS
200.0000 mg | ORAL_CAPSULE | Freq: Every day | ORAL | Status: DC
  Administered 2024-01-10 – 2024-01-12 (×3): 200 mg via ORAL
  Filled 2024-01-10 (×5): qty 2

## 2024-01-10 MED ORDER — ZONISAMIDE 100 MG PO CAPS
100.0000 mg | ORAL_CAPSULE | Freq: Every morning | ORAL | Status: DC
  Administered 2024-01-10 – 2024-01-13 (×4): 100 mg via ORAL
  Filled 2024-01-10 (×6): qty 1

## 2024-01-10 MED ORDER — GADOBUTROL 1 MMOL/ML IV SOLN
5.0000 mL | Freq: Once | INTRAVENOUS | Status: AC | PRN
  Administered 2024-01-10: 5 mL via INTRAVENOUS

## 2024-01-10 MED ORDER — LEVETIRACETAM (KEPPRA) 500 MG/5 ML ADULT IV PUSH: 500.0000 mg | Freq: Two times a day (BID) | INTRAVENOUS | Status: DC

## 2024-01-10 MED ORDER — ASPIRIN 300 MG RE SUPP
300.0000 mg | Freq: Every day | RECTAL | Status: DC
  Filled 2024-01-10: qty 1

## 2024-01-10 NOTE — Progress Notes (Signed)
     Patient Name: Lonnie Blair           DOB: [birth year]  MRN: [id number]      Admission Date: 01/09/2024  Attending Provider: [name], MD  Primary Diagnosis: Left-sided weakness   Level of care: Telemetry Medical   OVERNIGHT PROGRESS REPORT  MRI brain-- numerous small embolic appearing acute infarcts widely scattered in the bilateral anterior and posterior circulation.  No signs of hemorrhage or metastasis.  Right ICA with poor flow or occlusion in the neck and at the skull base.  Neurology aware of MRI findings and already consulted for seizures and left-sided weakness NIHSS PT/OT evaluation Speech evaluation Continue cardiac telemetry Aspirin  Hemoglobin A1c, lipid panel    Lonnie Horns, DNP, ACNPC- [employer] [facility] [employer]

## 2024-01-10 NOTE — Progress Notes (Signed)
 [facility]                                                                              Lonnie Blair, is a 73 y.o. male, DOB - October 20, 1950, FMW:[id number] Admit date - 01/09/2024    Outpatient Primary MD for the patient is [name], FNP  LOS - 1  days  Chief Complaint  Patient presents with   Neurologic Problem       Brief summary   Patient is a 73 year old male with history of seizures, sonogram, negative findings, alcohol use, previous pancreatitis and COPD presented with 1 week of generalized weakness, shortness of breath, coughing, confusion.  Per family he was out of it with this illness, very weak, had 2 seizures witnessed by his family.  He also reported nausea vomiting and abdominal pain, last BM on Thursday, 3 days PTA.  Family also reported a fever of 104 F 2 days prior to admission, very tired and somnolent.  Per wife, they called EMS today and was noted to have acute left-sided weakness.  No prior history of stroke. Per family, they have had a viral illness in the last week.   Smokes half pack per day, drinks 2 beers every other day, ambulates at baseline without any difficulty. Family also reported previous mention of pancreatic carcinoma last year  however have not seen oncology  CTA chest showed no PE, pulm left upper lobe spiculated pulmonary nodules, concerning for primary lung CA, advanced emphysema CT abdomen pelvis showed multiple liver masses consistent with metastatic disease, new.  No defined pancreatic mass although there is irregular dilation of the pancreatic head increased, wedge-shaped hypoattenuating areas in both kidneys, new.    Assessment & Plan     Acute left-sided weakness/multiple embolic CVAs - Per family, patient had left-sided weakness noted this morning, had 2 seizures prior to admission. New findings of metastatic disease on CT chest abdomen pelvis. - MRI brain showed numerous small embolic appearing acute  infarcts scattered in the bilateral anterior and posterior circulation, no hemorrhagic transfer effect.  Right ICA poor flow or occlusion in the neck with evidence of reconstituted right ICA terminus.  No metastatic disease. - Neurology consulted for stroke workup with CTA head and neck, 2D echo, tele - IV heparin  drip, stroke team will follow  Seizures, breakthrough -  received 1 dose of Keppra  IV in ED -EEG showed cortical dysfunction from the right hemisphere likely secondary to underlying structural abnormality, postictal state, mild to moderate diffuse encephalopathy, no seizures or epileptiform discharges -Increased zonisamide  to 100 mg a.m., 200 mg p.m.     Metastatic disease with lung mass, pancreatic mass, liver lesions -Per patient's family, there has been mention of pancreatic cancer in the past however they have not seen oncology.  - Appreciate pulmonology evaluation, IR consulted for liver biopsy  - consulted oncology, Dr. Sherrod to see   COVID 19 viral infection, COPD exacerbation - Ongoing symptoms for last 1 week, not be a candidate for antiviral now.   - Continue supportive treatment, IV fluids, DuoNebs, Pulmicort , Brovana   - Continue IV Unasyn  - Blood cultures NTD  Nicotine  abuse - Counseled on smoking cessation, placed on nicotine  patch   Nausea vomiting, abdominal pain, constipation - CT abdomen did not show any SBO or ileus, however has metastatic disease with possibility of pancreatic CA - Supportive care with antiemetics, bowel regimen     Generalized debility - PT OT evaluation   History of alcohol use -Placed on CIWA protocol with Ativan  -Thiamine , folate, MVI  Underweight, moderate protein calorie malnutrition, hypoalbuminemia Estimated body mass index is 17.08 kg/m as calculated from the following:   Height as of this encounter: 5' 6 (1.676 m).   Weight as of this encounter: 48 kg.  Code Status: Full code DVT Prophylaxis:  IV heparin   drip   Level of Care: Level of care: Telemetry Medical Family Communication: Updated patient's wife on the phone Disposition Plan:      Remains inpatient appropriate: Workup in progress   Procedures:    Consultants:   Neurology Pulmonology IR Oncology  Antimicrobials:   Anti-infectives (From admission, onward)    Start     Dose/Rate Route Frequency Ordered Stop   01/09/24 1700  Ampicillin -Sulbactam (UNASYN ) 3 g in sodium chloride  0.9 % 100 mL IVPB        3 g 200 mL/hr over 30 Minutes Intravenous Every 6 hours 01/09/24 1658            Medications  arformoterol   15 mcg Nebulization BID   budesonide  (PULMICORT ) nebulizer solution  0.25 mg Nebulization BID   docusate sodium   100 mg Oral BID   folic acid   1 mg Oral Daily   multivitamin with minerals  1 tablet Oral Daily   nicotine   21 mg Transdermal Daily   senna-docusate  1 tablet Oral BID   thiamine   100 mg Oral Daily   Or   thiamine   100 mg Intravenous Daily   zonisamide   100 mg Oral q AM   And   zonisamide   200 mg Oral QHS      Subjective:   Lonnie Blair was seen and examined today.  Talking on the phone with his wife, no acute issues overnight.  No chest pain, shortness of breath, fever or chills or vomiting today.   Objective:   Vitals:   01/09/24 2139 01/10/24 0510 01/10/24 0754 01/10/24 0905  BP: (!) 151/82 (!) 148/82 (!) 146/87   Pulse: 71 77 77   Resp: 16 17 16    Temp: 97.9 F (36.6 C) 97.7 F (36.5 C) (!) 97.5 F (36.4 C)   TempSrc: Oral Oral    SpO2: 96% 98% 97% 98%  Weight:      Height:        Intake/Output Summary (Last 24 hours) at 01/10/2024 0934 Last data filed at 01/10/2024 0400 Gross per 24 hour  Intake 2722.43 ml  Output 430 ml  Net 2292.43 ml     Wt Readings from Last 3 Encounters:  01/09/24 48 kg  12/29/22 48 kg     Exam General: Alert and oriented x 3, NAD ill appearing, cachectic Cardiovascular: S1 S2 auscultated,  RRR Respiratory: B/L scattered  rhonchi Gastrointestinal: Soft, nontender, nondistended, + bowel sounds Ext: no pedal edema bilaterally Neuro: Strength 5/5 upper and lower extremities bilaterally Psych: Normal affect     Data Reviewed:  I have personally reviewed following labs    CBC Lab Results  Component Value Date   WBC 12.4 (H) 01/10/2024   RBC 4.12 (L) 01/10/2024   HGB 12.1 (L) 01/10/2024   HCT 37.5 (L) 01/10/2024  MCV 91.0 01/10/2024   MCH 29.4 01/10/2024   PLT 91 (L) 01/10/2024   MCHC 32.3 01/10/2024   RDW 13.3 01/10/2024   LYMPHSABS 2.1 01/09/2024   MONOABS 1.1 (H) 01/09/2024   EOSABS 0.4 01/09/2024   BASOSABS 0.0 01/09/2024     Last metabolic panel Lab Results  Component Value Date   NA 137 01/10/2024   K 3.6 01/10/2024   CL 103 01/10/2024   CO2 20 (L) 01/10/2024   BUN 11 01/10/2024   CREATININE 0.80 01/10/2024   GLUCOSE 73 01/10/2024   GFRNONAA >60 01/10/2024   GFRAA >60 01/04/2011   CALCIUM  8.9 01/10/2024   PROT 6.8 01/10/2024   ALBUMIN 2.9 (L) 01/10/2024   BILITOT 0.6 01/10/2024   ALKPHOS 121 01/10/2024   AST 48 (H) 01/10/2024   ALT 25 01/10/2024   ANIONGAP 14 01/10/2024    CBG (last 3)  No results for input(s): GLUCAP in the last 72 hours.    Coagulation Profile: Recent Labs  Lab 01/09/24 1157  INR 1.0     Radiology Studies: I have personally reviewed the imaging studies  EEG adult Result Date: 01/10/2024 Shelton Arlin KIDD, MD     01/10/2024  5:53 AM Patient Name: Jamesyn Lindell MRN: [id number] Epilepsy Attending: Arlin KIDD Shelton Referring Physician/Provider: [name], MD Date: 01/09/2024 Duration: 25.09 mins Patient history: 73yo M with left sided weakness and seizure. EEG to evaluate for seizure. Level of alertness: Awake AEDs during EEG study: None Technical aspects: This EEG study was done with scalp electrodes positioned according to the 10-20 International system of electrode placement. Electrical activity was reviewed with band pass filter of 1-70Hz ,  sensitivity of 7 uV/mm, display speed of 26mm/sec with a 60Hz  notched filter applied as appropriate. EEG data were recorded continuously and digitally stored.  Video monitoring was available and reviewed as appropriate. Description: The posterior dominant rhythm consists of 7.5 Hz activity of moderate voltage (25-35 uV) seen predominantly in posterior head regions, symmetric and reactive to eye opening and eye closing. EEG showed intermittent generalized and lateralized right hemisphere 3 to 6 Hz theta-delta slowing. Hyperventilation and photic stimulation were not performed.   ABNORMALITY - Intermittent slow, generalized and lateralized right hemisphere IMPRESSION: This study is suggestive of cortical dysfunction arising from right hemisphere likely secondary to underlying structural abnormality, post-ictal state. Additionally there is mild to moderate diffuse encephalopathy. No seizures or epileptiform discharges were seen throughout the recording. Arlin KIDD Shelton   MR BRAIN W WO CONTRAST Addendum Date: 01/10/2024 ADDENDUM REPORT: 01/10/2024 05:12 ADDENDUM: Study discussed by telephone with PA A. Chavez on 01/10/2024 at 0505 hours. Electronically Signed   By: VEAR Hurst M.D.   On: 01/10/2024 05:12   Result Date: 01/10/2024 CLINICAL DATA:  73 year old male with seizures. Metastatic pancreatic cancer. EXAM: MRI HEAD WITHOUT AND WITH CONTRAST TECHNIQUE: Multiplanar, multiecho pulse sequences of the brain and surrounding structures were obtained without and with intravenous contrast. CONTRAST:  5mL GADAVIST  GADOBUTROL  1 MMOL/ML IV SOLN COMPARISON:  Head CT yesterday. FINDINGS: Brain: Widely scattered small infarcts in the bilateral cerebellar and right greater than left cerebral hemispheres. Anterior and posterior circulation affected, including the occipital poles. In the right hemisphere there is more conspicuous watershed pattern ischemia on series 5, image 81. The deep gray nuclei and brainstem are spared. T2  and FLAIR hyperintense cytotoxic edema in the affected areas. No acute or chronic intracranial hemorrhage identified. No abnormal enhancement identified. No dural thickening. No midline shift, mass effect, or evidence of  intracranial mass lesion. No ventriculomegaly, extra-axial collection. Cervicomedullary junction and pituitary are within normal limits. And outside of the acute findings, the background gray and white matter signal is fairly normal for age. Vascular: Right ICA flow void is absent throughout the upper neck and siphon, with evidence of reconstituted flow at the right ICA terminus. Other Major intracranial vascular flow voids are preserved. Skull and upper cervical spine: Visualized bone marrow signal is within normal limits. Negative visible cervical spine for age. Sinuses/Orbits: Negative. Other: Mastoids are clear. Visible internal auditory structures appear normal. Negative visible scalp and face. IMPRESSION: 1. Numerous small Embolic appearing acute infarcts are widely scattered in the bilateral anterior and posterior circulation. No hemorrhagic transformation or mass effect. 2. Right ICA Poor flow or Occlusion in the neck and at the skull base, with evidence of reconstituted right ICA terminus. This is age indeterminate, but most likely nonacute given the infarct pattern in #1. 3.  No metastatic disease identified. Electronically Signed: By: VEAR Hurst M.D. On: 01/10/2024 04:52   CT Angio Chest PE W and/or Wo Contrast Result Date: 01/09/2024 CLINICAL DATA:  Unilateral left-sided weakness beginning over 1 week ago. Unable to move left arm or leg. Also with cough. Patient also with abdominal pain. EXAM: CT ANGIOGRAPHY CHEST CT ABDOMEN AND PELVIS WITH CONTRAST TECHNIQUE: Multidetector CT imaging of the chest was performed using the standard protocol during bolus administration of intravenous contrast. Multiplanar CT image reconstructions and MIPs were obtained to evaluate the vascular anatomy.  Multidetector CT imaging of the abdomen and pelvis was performed using the standard protocol during bolus administration of intravenous contrast. RADIATION DOSE REDUCTION: This exam was performed according to the departmental dose-optimization program which includes automated exposure control, adjustment of the mA and/or kV according to patient size and/or use of iterative reconstruction technique. CONTRAST:  75mL OMNIPAQUE  IOHEXOL  350 MG/ML SOLN COMPARISON:  01/01/2023. FINDINGS: CTA CHEST FINDINGS Cardiovascular: Pulmonary arteries are well opacified. There is no evidence of a pulmonary embolism. Heart is normal in size. No pericardial effusion. Thoracic aorta is normal in caliber. There is aortic atherosclerosis, but no dissection. Mediastinum/Nodes: No neck base, mediastinal or hilar masses. No enlarged lymph nodes. Trachea esophagus are unremarkable. Lungs/Pleura: There is a spiculated nodule that abuts the overlying fluoro, associated with pleural thickening. Nodule measures 11 mm in greatest axial dimension and 1.4 cm in greatest dimension on the coronal sequence. This nodule has increased in size from the prior CT. And is centered on image 19, series 7. Small spiculated nodule, anterior left upper lobe, image 34, series 7, 7 x 4 mm, mean 5.5 mm, increased in size from the prior CT. 5 mm nodule, superior segment of the left lower lobe, image 28, series 7, abuts the oblique fissure. There is adjacent nodular pleural thickening of the upper aspect of the left oblique fissure. This is similar to the prior CT. Advanced centrilobular emphysema. Stable air cyst, right lower lobe. No lung consolidation or evidence of edema. No pleural effusion or pneumothorax. Musculoskeletal: No fracture or acute finding. No bone lesion. No chest wall mass. Review of the MIP images confirms the above findings. CT ABDOMEN and PELVIS FINDINGS Hepatobiliary: Liver normal in size and overall attenuation. There are numerous small  hypoattenuating masses, largest at the dome of the right lobe, 1.3 cm. Findings are consistent with metastatic disease. Gallbladder is unremarkable. No bile duct dilation. Pancreas: Pancreatic atrophy and irregular duct dilation. Both findings have progressed since the prior CT. Duct with a maximum diameter  of 8 mm. Calcifications lie adjacent to the distal pancreatic and common bile duct in the pancreatic head new since the prior CT. No defined pancreatic mass. There is ill-defined soft tissue adjacent to the common bile duct along the porta hepatis. No evidence of pancreatic inflammation. Spleen: Normal in size without focal abnormality. Adrenals/Urinary Tract: No adrenal mass. Kidneys normal in overall size and position with symmetric enhancement. There are wedge-shaped areas of hypoattenuation, anterolateral left kidney, mid to lower pole and posterior upper pole the right kidney, new since the prior CT, consistent with infarcts. No defined renal mass. No hydronephrosis. Ureters are grossly normal in course and in caliber. Bladder is unremarkable. Stomach/Bowel: Stomach is mostly decompressed, otherwise unremarkable. Grossly normal appearance of the small bowel. Right colon is mildly distended up to 5 cm. There is no wall thickening or inflammation. Vascular/Lymphatic: Prominent and mildly enlarged shoddy lymph nodes in the upper abdomen, gastrohepatic ligament, Peri celiac and aortocaval. Gastrohepatic ligament node measures largest, 1.4 cm. Nodes have increased when compared to the prior CT. Dense aortoiliac atherosclerotic calcification. No aneurysm. Reproductive: Mild stable prostate enlargement. Other: No abdominal wall hernia.  No ascites. Musculoskeletal: No fracture or acute finding. No osteoblastic or osteolytic lesions. Review of the MIP images confirms the above findings. IMPRESSION: CTA CHEST 1. No evidence of a pulmonary embolism. 2. No acute findings. 3. 2 left upper lobe pulmonary nodules, both  spiculated appearance and suspicious, largest abutting the anterior pleural surface measuring 1.4 cm in greatest dimension. Both nodules have increased in size from the previous CT and are concerning for primary lung carcinoma. 4. Advanced emphysema.  Aortic atherosclerosis. CT ABDOMEN AND PELVIS 1. No acute findings. 2. Multiple low-attenuation liver masses consistent with metastatic disease, new from the prior CT. There are also enlarged lymph nodes in the upper abdomen, largest along the gastrohepatic ligament, consistent with metastatic disease. History provided for the current head CT reports pancreatic cancer. 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 3. No defined pancreatic mass, although there is irregular dilation of the pancreatic duct that has increased when compared to the prior CT. 4. Dense aortic atherosclerosis. 5. Wedge-shaped hypoattenuating areas in both kidneys, new since the prior CT, suspected to be infarcts. Electronically Signed   By: Alm Parkins M.D.   On: 01/09/2024 14:53   CT ABDOMEN PELVIS W CONTRAST Result Date: 01/09/2024 CLINICAL DATA:  Unilateral left-sided weakness beginning over 1 week ago. Unable to move left arm or leg. Also with cough. Patient also with abdominal pain. EXAM: CT ANGIOGRAPHY CHEST CT ABDOMEN AND PELVIS WITH CONTRAST TECHNIQUE: Multidetector CT imaging of the chest was performed using the standard protocol during bolus administration of intravenous contrast. Multiplanar CT image reconstructions and MIPs were obtained to evaluate the vascular anatomy. Multidetector CT imaging of the abdomen and pelvis was performed using the standard protocol during bolus administration of intravenous contrast. RADIATION DOSE REDUCTION: This exam was performed according to the departmental dose-optimization program which includes automated exposure control, adjustment of the mA and/or kV according to patient size and/or use of iterative reconstruction  technique. CONTRAST:  75mL OMNIPAQUE  IOHEXOL  350 MG/ML SOLN COMPARISON:  01/01/2023. FINDINGS: CTA CHEST FINDINGS Cardiovascular: Pulmonary arteries are well opacified. There is no evidence of a pulmonary embolism. Heart is normal in size. No pericardial effusion. Thoracic aorta is normal in caliber. There is aortic atherosclerosis, but no dissection. Mediastinum/Nodes: No neck base, mediastinal or hilar masses. No enlarged lymph nodes. Trachea esophagus are unremarkable. Lungs/Pleura: There is a spiculated nodule  that abuts the overlying fluoro, associated with pleural thickening. Nodule measures 11 mm in greatest axial dimension and 1.4 cm in greatest dimension on the coronal sequence. This nodule has increased in size from the prior CT. And is centered on image 19, series 7. Small spiculated nodule, anterior left upper lobe, image 34, series 7, 7 x 4 mm, mean 5.5 mm, increased in size from the prior CT. 5 mm nodule, superior segment of the left lower lobe, image 28, series 7, abuts the oblique fissure. There is adjacent nodular pleural thickening of the upper aspect of the left oblique fissure. This is similar to the prior CT. Advanced centrilobular emphysema. Stable air cyst, right lower lobe. No lung consolidation or evidence of edema. No pleural effusion or pneumothorax. Musculoskeletal: No fracture or acute finding. No bone lesion. No chest wall mass. Review of the MIP images confirms the above findings. CT ABDOMEN and PELVIS FINDINGS Hepatobiliary: Liver normal in size and overall attenuation. There are numerous small hypoattenuating masses, largest at the dome of the right lobe, 1.3 cm. Findings are consistent with metastatic disease. Gallbladder is unremarkable. No bile duct dilation. Pancreas: Pancreatic atrophy and irregular duct dilation. Both findings have progressed since the prior CT. Duct with a maximum diameter of 8 mm. Calcifications lie adjacent to the distal pancreatic and common bile duct in  the pancreatic head new since the prior CT. No defined pancreatic mass. There is ill-defined soft tissue adjacent to the common bile duct along the porta hepatis. No evidence of pancreatic inflammation. Spleen: Normal in size without focal abnormality. Adrenals/Urinary Tract: No adrenal mass. Kidneys normal in overall size and position with symmetric enhancement. There are wedge-shaped areas of hypoattenuation, anterolateral left kidney, mid to lower pole and posterior upper pole the right kidney, new since the prior CT, consistent with infarcts. No defined renal mass. No hydronephrosis. Ureters are grossly normal in course and in caliber. Bladder is unremarkable. Stomach/Bowel: Stomach is mostly decompressed, otherwise unremarkable. Grossly normal appearance of the small bowel. Right colon is mildly distended up to 5 cm. There is no wall thickening or inflammation. Vascular/Lymphatic: Prominent and mildly enlarged shoddy lymph nodes in the upper abdomen, gastrohepatic ligament, Peri celiac and aortocaval. Gastrohepatic ligament node measures largest, 1.4 cm. Nodes have increased when compared to the prior CT. Dense aortoiliac atherosclerotic calcification. No aneurysm. Reproductive: Mild stable prostate enlargement. Other: No abdominal wall hernia.  No ascites. Musculoskeletal: No fracture or acute finding. No osteoblastic or osteolytic lesions. Review of the MIP images confirms the above findings. IMPRESSION: CTA CHEST 1. No evidence of a pulmonary embolism. 2. No acute findings. 3. 2 left upper lobe pulmonary nodules, both spiculated appearance and suspicious, largest abutting the anterior pleural surface measuring 1.4 cm in greatest dimension. Both nodules have increased in size from the previous CT and are concerning for primary lung carcinoma. 4. Advanced emphysema.  Aortic atherosclerosis. CT ABDOMEN AND PELVIS 1. No acute findings. 2. Multiple low-attenuation liver masses consistent with metastatic disease,  new from the prior CT. There are also enlarged lymph nodes in the upper abdomen, largest along the gastrohepatic ligament, consistent with metastatic disease. History provided for the current head CT reports pancreatic cancer. 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 3. No defined pancreatic mass, although there is irregular dilation of the pancreatic duct that has increased when compared to the prior CT. 4. Dense aortic atherosclerosis. 5. Wedge-shaped hypoattenuating areas in both kidneys, new since the prior CT, suspected to be infarcts. Electronically Signed  By: Alm Parkins M.D.   On: 01/09/2024 14:53   CT Head Wo Contrast Result Date: 01/09/2024 CLINICAL DATA:  Mental status change. Multiple seizures today. History of pancreatic carcinoma. EXAM: CT HEAD WITHOUT CONTRAST TECHNIQUE: Contiguous axial images were obtained from the base of the skull through the vertex without intravenous contrast. RADIATION DOSE REDUCTION: This exam was performed according to the departmental dose-optimization program which includes automated exposure control, adjustment of the mA and/or kV according to patient size and/or use of iterative reconstruction technique. COMPARISON:  12/26/2020. FINDINGS: Brain: No evidence of acute infarction, hemorrhage, hydrocephalus, extra-axial collection or mass lesion/mass effect. Vascular: No hyperdense vessel or unexpected calcification. Skull: Normal. Negative for fracture or focal lesion. Sinuses/Orbits: Globes and orbits are unremarkable. Visualized sinuses are clear. Other: None. IMPRESSION: No acute intracranial abnormalities. Electronically Signed   By: Alm Parkins M.D.   On: 01/09/2024 14:27   DG Elbow Complete Left Result Date: 01/09/2024 CLINICAL DATA:  Elbow pain. History of pancreatic cancer. No history of reported trauma EXAM: LEFT ELBOW - COMPLETE 2 VIEW COMPARISON:  None Available. FINDINGS: Hyperostosis along the olecranon. No fracture or  dislocation. Grossly preserved joint spaces. No large joint effusion on the lateral view is somewhat rotated limiting evaluation for subtle joint effusion. Repeat lateral film as clinically appropriate. IMPRESSION: Grossly no acute osseous abnormality.  Limited lateral view. Electronically Signed   By: Ranell Bring M.D.   On: 01/09/2024 12:39       Joud Pettinato M.D. [facility] 01/10/2024, 9:34 AM  Available via Epic secure chat 7am-7pm After 7 pm, please refer to night coverage provider listed on amion.

## 2024-01-10 NOTE — Progress Notes (Signed)
 OT Cancellation Note  Patient Details Name: Rowin Bayron MRN: [id number] DOB: [birth year]   Cancelled Treatment:    Reason Eval/Treat Not Completed: Patient at procedure or test/ unavailable (Pt off unit for CT. OT to reattempt to see pt for acute OT eval at a later time as appropriate/available.)  Margarie Rockey HERO., OTR/L, MA Acute Rehab [phone]   Margarie FORBES Horns 01/10/2024, 9:15 AM

## 2024-01-10 NOTE — Progress Notes (Signed)
 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   Medical History: Past Medical History:  Diagnosis Date   Seizures (HCC)     Medications:  Medications Prior to Admission  Medication Sig Dispense Refill Last Dose/Taking   albuterol  (VENTOLIN  HFA) 108 (90 Base) MCG/ACT inhaler Inhale 1-2 puffs into the lungs every 6 (six) hours as needed for wheezing or shortness of breath.   Unknown   ipratropium-albuterol  (DUONEB) 0.5-2.5 (3) MG/3ML SOLN Take 3 mLs by nebulization every 6 (six) hours as needed.   01/09/2024   lactose free nutrition (BOOST PLUS) LIQD Take 237 mLs by mouth once a week. Drink 237 ml's of Strawberry Boost Plus   Past Week   zonisamide  (ZONEGRAN ) 100 MG capsule Take 100 mg by mouth in the morning and at bedtime.   01/09/2024 Morning   NICOTINE  TD Place 1 patch onto the skin daily. (Patient not taking: Reported on 01/09/2024)   Not Taking   Scheduled:   arformoterol   15 mcg Nebulization BID   budesonide  (PULMICORT ) nebulizer solution  0.25 mg Nebulization BID   docusate sodium   100 mg Oral BID   enoxaparin  (LOVENOX ) injection  40 mg Subcutaneous Q24H   folic acid   1 mg Oral Daily   multivitamin with minerals  1 tablet Oral Daily   nicotine   21 mg Transdermal Daily    senna-docusate  1 tablet Oral BID   thiamine   100 mg Oral Daily   Or   thiamine   100 mg Intravenous Daily   zonisamide   100 mg Oral q AM   And   zonisamide   200 mg Oral QHS   Infusions:   sodium chloride  100 mL/hr at 01/09/24 1909   ampicillin -sulbactam (UNASYN ) IV 3 g (01/10/24 0515)    Assessment: Patient is a 73 year old man who presented to the ED with 1 week of generalized weakness, shortness of breath, coughing, and confusion. Patient does have a history of seizure disorders. The patient had 2 seizures prior to admission and reported acute left-sided weakness to EMS. No prior history of stroke. MRI revealed multiple embolic infarcts, meaning patient is likely hypercoagulable in the setting of cancer.   Hemoglobin (11.9) is stable. Platelets (92) are slightly downtrending, continue to follow. Renal function is normal. Pharmacy consulted to manage IV heparin  therapy while inpatient. Patient was not on anticoagulation PTA.  Goal of Therapy:  Heparin  level 0.3-0.5 units/ml Monitor platelets by anticoagulation protocol: Yes   Plan:  Start heparin  infusion at 600 units/hr with no bolus due to stroke. Continue to monitor H&H and platelets. Check anti-Xa level 8 hours after start of infusion to adjust plan as needed. Monitor for any signs or symptoms of bleeding.  Thank you for allowing  pharmacy to be a part of this patient's care.  Mendel Barter, PharmD PGY1 Clinical Pharmacist [facility]  01/10/2024 7:56 AM

## 2024-01-10 NOTE — Consult Note (Addendum)
 NEUROLOGY CONSULT NOTE   Date of service: January 10, 2024 Patient Name: Lonnie Blair MRN:  [id number] DOB:  [birth year] Chief Complaint: Left-sided weakness Requesting Provider: [name], MD  History of Present Illness  Lonnie Blair is a 73 y.o. male with hx of COPD, tobacco use, alcohol use, lung nodule, seizures on zonisamide  who presents with generalized weakness, cough, shortness of breath and confusion.  Reportedly had 104 fever at home, has been very tired and somnolent with nausea and vomiting and 2 seizures at home.  Workup in the ED with WBC of 15.  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 He is positive for COVID.  He had a routine EEG which did not demonstrate any seizures.  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s.  Neurology was consulted further evaluation workup.  He reports feeling off balance, had a headache but not at this time.  He is positive for COVID.  LKW: About a week ago Modified rankin score: 2-Slight disability-UNABLE to perform all activities but does not need assistance IV Thrombolysis: Not offered, outside window.   EVT: Not offered, outside window.    NIHSS components Score: Comment  1a Level of Conscious 0[x]  1[]  2[]  3[]      1b LOC Questions 0[x]  1[]  2[]       1c LOC Commands 0[x]  1[]  2[]       2 Best Gaze 0[x]  1[]  2[]       3 Visual 0[x]  1[]  2[]  3[]      4 Facial Palsy 0[x]  1[]  2[]  3[]      5a Motor Arm - left 0[x]  1[]  2[]  3[]  4[]  UN[]    5b Motor Arm - Right 0[x]  1[]  2[]  3[]  4[]  UN[]    6a Motor Leg - Left 0[x]  1[]  2[]  3[]  4[]  UN[]    6b Motor Leg - Right 0[x]  1[]  2[]  3[]  4[]  UN[]    7 Limb Ataxia 0[x]  1[]  2[]  UN[]      8 Sensory 0[x]  1[]  2[]  UN[]      9 Best Language 0[x]  1[]  2[]  3[]      10 Dysarthria 0[x]  1[]  2[]  UN[]      11 Extinct. and Inattention 0[x]  1[]  2[]       TOTAL: 0      ROS  Comprehensive ROS performed and  pertinent positives documented in HPI   Past History   Past Medical History:  Diagnosis Date   Seizures (HCC)     Past Surgical History:  Procedure Laterality Date   OTHER SURGICAL HISTORY  2010   nasal non cancerous tumor removed    Family History: History reviewed. No pertinent family history.  Social History  reports that he has been smoking. He does not have any smokeless tobacco history on file. He reports current alcohol use. He reports that he does not use drugs.  Allergies  Allergen Reactions   Tylenol  [Acetaminophen ] Itching    Medications   Current Facility-Administered Medications:    0.9 %  sodium chloride  infusion, , Intravenous, Continuous, Rai, Ripudeep K, MD, Last Rate: 100 mL/hr at 01/09/24 1909, New Bag at 01/09/24 1909   Ampicillin -Sulbactam (UNASYN ) 3 g in sodium chloride  0.9 % 100 mL IVPB, 3 g, Intravenous, Q6H, Rai, Ripudeep K, MD, Last Rate: 200 mL/hr at 01/10/24 0515, 3 g at 01/10/24 0515   arformoterol  (BROVANA ) nebulizer solution 15 mcg, 15 mcg, Nebulization, BID, Rai, Ripudeep K, MD, 15 mcg at 01/09/24 2115   aspirin  suppository 300 mg, 300  mg, Rectal, Daily **OR** aspirin  tablet 325 mg, 325 mg, Oral, Daily, Chavez, Abigail, NP   budesonide  (PULMICORT ) nebulizer solution 0.25 mg, 0.25 mg, Nebulization, BID, Rai, Ripudeep K, MD, 0.25 mg at 01/09/24 2115   docusate sodium  (COLACE) capsule 100 mg, 100 mg, Oral, BID, Rai, Ripudeep K, MD, 100 mg at 01/09/24 2227   enoxaparin  (LOVENOX ) injection 40 mg, 40 mg, Subcutaneous, Q24H, Rai, Ripudeep K, MD, 40 mg at 01/09/24 1810   folic acid  (FOLVITE ) tablet 1 mg, 1 mg, Oral, Daily, Rai, Ripudeep K, MD, 1 mg at 01/09/24 1810   LORazepam  (ATIVAN ) tablet 1-4 mg, 1-4 mg, Oral, Q1H PRN **OR** LORazepam  (ATIVAN ) injection 1-4 mg, 1-4 mg, Intravenous, Q1H PRN, Rai, Ripudeep K, MD   multivitamin with minerals tablet 1 tablet, 1 tablet, Oral, Daily, Rai, Ripudeep K, MD, 1 tablet at 01/09/24 1810   nicotine  (NICODERM CQ  -  dosed in mg/24 hours) patch 21 mg, 21 mg, Transdermal, Daily, Rai, Ripudeep K, MD, 21 mg at 01/09/24 1810   ondansetron  (ZOFRAN ) tablet 4 mg, 4 mg, Oral, Q6H PRN **OR** ondansetron  (ZOFRAN ) injection 4 mg, 4 mg, Intravenous, Q6H PRN, Rai, Ripudeep K, MD   Oral care mouth rinse, 15 mL, Mouth Rinse, PRN, Rai, Ripudeep K, MD   polyethylene glycol (MIRALAX  / GLYCOLAX ) packet 17 g, 17 g, Oral, Daily PRN, Rai, Ripudeep K, MD   senna-docusate (Senokot-S) tablet 1 tablet, 1 tablet, Oral, BID, Rai, Ripudeep K, MD, 1 tablet at 01/09/24 2227   thiamine  (VITAMIN B1) tablet 100 mg, 100 mg, Oral, Daily, 100 mg at 01/09/24 1810 **OR** thiamine  (VITAMIN B1) injection 100 mg, 100 mg, Intravenous, Daily, Rai, Ripudeep K, MD   traMADol  (ULTRAM ) tablet 50 mg, 50 mg, Oral, Q8H PRN, Rai, Ripudeep K, MD, 50 mg at 01/09/24 1810  Vitals   Vitals:   01/09/24 2118 01/09/24 2120 01/09/24 2139 01/10/24 0510  BP:   (!) 151/82 (!) 148/82  Pulse: 80  71 77  Resp: 16  16 17   Temp:   97.9 F (36.6 C) 97.7 F (36.5 C)  TempSrc:   Oral Oral  SpO2:  97% 96% 98%  Weight:      Height:        Body mass index is 17.08 kg/m.   Physical Exam   General: Laying comfortably in bed; in no acute distress. Cachectic. HENT: Normal oropharynx and mucosa. Normal external appearance of ears and nose.  Neck: Supple, no pain or tenderness  CV: No JVD. No peripheral edema.  Pulmonary: Symmetric Chest rise. Normal respiratory effort.  Abdomen: Soft to touch, non-tender.  Ext: No cyanosis, edema, or deformity  Skin: No rash. Normal palpation of skin.   Musculoskeletal: Normal digits and nails by inspection. No clubbing.   Neurologic Examination  Mental status/Cognition: Alert, oriented to self, place, month and year, good attention. Speech/language: Fluent, comprehension intact, object naming intact, repetition intact.  Cranial nerves:   CN II Pupils equal and reactive to light, no VF deficits    CN III,IV,VI EOM intact, no gaze  preference or deviation, no nystagmus    CN V normal sensation in V1, V2, and V3 segments bilaterally    CN VII no asymmetry, no nasolabial fold flattening    CN VIII normal hearing to speech    CN IX & X normal palatal elevation, no uvular deviation    CN XI 5/5 head turn and 5/5 shoulder shrug bilaterally    CN XII midline tongue protrusion    Motor:  Muscle  bulk: normal, tone normal, pronator drift none tremor none Mvmt Root Nerve  Muscle Right Left Comments  SA C5/6 Ax Deltoid 5 5   EF C5/6 Mc Biceps 5 5   EE C6/7/8 Rad Triceps 5 5   WF C6/7 Med FCR     WE C7/8 PIN ECU     F Ab C8/T1 U ADM/FDI 5 5   HF L1/2/3 Fem Illopsoas 5 5   KE L2/3/4 Fem Quad 5 5   DF L4/5 D Peron Tib Ant 5 5   PF S1/2 Tibial Grc/Sol 5 5    Sensation:  Light touch Intact throughout   Pin prick    Temperature    Vibration   Proprioception    Coordination/Complex Motor:  - Finger to Nose intact BL - Heel to shin intact BL - Rapid alternating movement are slowed - Gait: deferred  Labs/Imaging/Neurodiagnostic studies   CBC:  Recent Labs  Lab 2024/02/06 1157 02-06-2024 1901  WBC 15.0* 13.1*  NEUTROABS 11.3*  --   HGB 11.5* 11.9*  HCT 35.4* 36.6*  MCV 91.2 90.8  PLT 121* 92*   Basic Metabolic Panel:  Lab Results  Component Value Date   NA 137 Feb 06, 2024   K 4.9 02/06/2024   CO2 25 02-06-2024   GLUCOSE 84 2024/02/06   BUN 19 06-Feb-2024   CREATININE 0.88 February 06, 2024   CALCIUM  9.1 02-06-2024   GFRNONAA >60 February 06, 2024   GFRAA >60 01/04/2011   Lipid Panel: No results found for: LDLCALC HgbA1c: No results found for: HGBA1C Urine Drug Screen: No results found for: LABOPIA, COCAINSCRNUR, LABBENZ, AMPHETMU, THCU, LABBARB  Alcohol Level     Component Value Date/Time   ETH <15 Feb 06, 2024 1900   INR  Lab Results  Component Value Date   INR 1.0 2024-02-06   APTT No results found for: APTT AED levels: No results found for: PHENYTOIN, ZONISAMIDE , LAMOTRIGINE,  LEVETIRACETA  CT Head without contrast(Personally reviewed): CTH was negative for a large hypodensity concerning for a large territory infarct or hyperdensity concerning for an ICH  CT angio Head and Neck with contrast: pending  MRI Brain(Personally reviewed): 1. Numerous small Embolic appearing acute infarcts are widely scattered in the bilateral anterior and posterior circulation. No hemorrhagic transformation or mass effect.   2. Right ICA Poor flow or Occlusion in the neck and at the skull base, with evidence of reconstituted right ICA terminus. This is age indeterminate, but most likely nonacute given the infarct pattern in #1.   3.  No metastatic disease identified.  Neurodiagnostics rEEG:  This study is suggestive of cortical dysfunction arising from right hemisphere likely secondary to underlying structural abnormality, post-ictal state. Additionally there is  mild to moderate diffuse encephalopathy. No seizures or epileptiform discharges were seen throughout the recording.  ASSESSMENT   Baylen Buckner is a 73 y.o. male with hx of COPD, tobacco use, alcohol use, lung nodule, seizures on zonisamide  who presents with generalized weakness, cough, shortness of breath and confusion and breakthrough seizures x 2.  He is on zonisamide  100mg  BID at home and reports compliance. EEG in the past has shown sharps  back on 6/1//10 per neurology notes.  Suspect he had breakthrough seizures in the setting of COVID, embolic infarcts.  Etiology of his multiple infarcts is likely hypercoagulable in the setting of cancer.  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 - Heparin  gtt neuro scale, no bolus dosing. - SBP goal - keep SBP below 180. - Recommend Telemetry monitoring for arrythmia - Recommend bedside swallow screen prior to PO intake. - Stroke education booklet - Recommend PT/OT/SLP consult  Breakthrough Seizures: back to baseline. Likely secondary to COVID and embolic strokes. - increase home zonisamide  to 100mg  in AM and 200mg  in PM. - rEEG with no seizures or epileptiform discahrges.  ______________________________________________________________________  Plan discussed with patient, attempted to contact patient's wife over phone but her phone goes to voicemail. Plan discussed with Dr. Davia with the hospitalist team.  I personally spent a total of 75 minutes in the care of the patient today including preparing to see the patient, getting/reviewing separately obtained history, performing a medically appropriate exam/evaluation, counseling and educating, placing orders, referring and communicating with other health care professionals, documenting clinical information in the EHR, independently interpreting results, and communicating results.   Signed, Leo Fray, MD [facility]

## 2024-01-10 NOTE — Consult Note (Signed)
 [facility] [phone]) [phone]   [phone]) [phone]  CONSULT NOTE  REFERRING PHYSICIAN: Dr. Nydia Distance  REASON FOR CONSULTATION:  16 years black male with suspicious metastatic pancreatic cancer  HPI Lonnie Blair is a 73 y.o. male.  The patient has a history of seizure, COPD, history of pancreatitis as well as alcohol and tobacco abuse.  He was brought to the emergency department via EMS after his family noticed 1 week generalized weakness and shortness of breath as well as coughing and confusion in addition to 2 episodes of seizure at home.  He also had leg pain and constipation.  The family mentioned that he had a history of pancreatic cancer diagnosed years ago but there is no records and he was not seen by any oncologist.  During his evaluation he had CT angiogram of the chest in addition to CT of the abdomen and pelvis 01/09/2024 and that showed no evidence for pulmonary embolism and no acute findings but there was 2 left upper lobe pulmonary nodules.  Spiculated in appearance and suspicious for malignancy the largest 1 is abutting the anterior pleural surface and measuring 1.4 cm in greatest dimension.  This nodule increased in size compared to previous scan and suspicious for primary lung carcinoma.  The scan of the abdomen showed multiple low-attenuation liver masses consistent with metastatic disease new from the prior CT.  There was also enlarged lymph nodes in the upper abdomen the largest is along the gastrohepatic ligament assistant with metastatic disease..  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  There was no defined pancreatic mass although there is irregular dilatation of the second attic duct that has increased when compared to the prior CT. The patient had MRI of the brain with and without contrast earlier today and that showed numerous small embolic appearing acute infarcts that are widely scattered in the bilateral cerebral and  posterior circulation with no hemorrhagic transformation or mass effect.  He is seen by neurology.  Discussed the use of AI scribe software for clinical note transcription with the patient, who gave verbal consent to proceed. The patient presents with seizures and findings of lung and liver lesions.  He experienced two seizures, which were noticed by his wife, prompting her to call EMS. He has a history of seizures with unknown etiology.  There is a mention of a previous diagnosis of pancreatic cancer at the same hospital, although there is no record of this diagnosis in his medical history. He did not follow up with a medical oncologist after this diagnosis.  He has a history of smoking for four years and consumes alcohol regularly. He is married with two children and previously worked as a Pharmacist, community with an Doctor, general practice.   HPI  Past Medical History:  Diagnosis Date   Seizures [facility])       Past Surgical History:  Procedure Laterality Date   OTHER SURGICAL HISTORY  2010   nasal non cancerous tumor removed    History reviewed. No pertinent family history.  Social History Social History   Tobacco Use   Smoking status: Some Days  Substance Use Topics   Alcohol use: Yes   Drug use: No    Allergies  Allergen Reactions   Tylenol  [Acetaminophen ] Itching    Current Facility-Administered Medications  Medication Dose Route Frequency Provider Last Rate Last Admin   0.9 %  sodium chloride  infusion   Intravenous Continuous Rai, Ripudeep K, MD 100 mL/hr at 01/10/24 0851 New Bag  at 01/10/24 9148   Ampicillin -Sulbactam (UNASYN ) 3 g in sodium chloride  0.9 % 100 mL IVPB  3 g Intravenous Q6H Rai, Ripudeep K, MD 200 mL/hr at 01/10/24 0515 3 g at 01/10/24 0515   arformoterol  (BROVANA ) nebulizer solution 15 mcg  15 mcg Nebulization BID Rai, Ripudeep K, MD   15 mcg at 01/10/24 9094   budesonide  (PULMICORT ) nebulizer solution 0.25 mg  0.25 mg Nebulization BID Rai, Ripudeep K, MD    0.25 mg at 01/10/24 9094   docusate sodium  (COLACE) capsule 100 mg  100 mg Oral BID Rai, Ripudeep K, MD   100 mg at 01/10/24 9171   folic acid  (FOLVITE ) tablet 1 mg  1 mg Oral Daily Rai, Ripudeep K, MD   1 mg at 01/10/24 9170   heparin  ADULT infusion 100 units/mL (25000 units/250mL)  600 Units/hr Intravenous Continuous Ygnacio Limerick, RPH 6 mL/hr at 01/10/24 0848 600 Units/hr at 01/10/24 0848   LORazepam  (ATIVAN ) tablet 1-4 mg  1-4 mg Oral Q1H PRN Rai, Ripudeep K, MD       Or   LORazepam  (ATIVAN ) injection 1-4 mg  1-4 mg Intravenous Q1H PRN Rai, Ripudeep K, MD       multivitamin with minerals tablet 1 tablet  1 tablet Oral Daily Rai, Ripudeep K, MD   1 tablet at 01/10/24 9170   nicotine  (NICODERM CQ  - dosed in mg/24 hours) patch 21 mg  21 mg Transdermal Daily Rai, Ripudeep K, MD   21 mg at 01/10/24 0830   ondansetron  (ZOFRAN ) tablet 4 mg  4 mg Oral Q6H PRN Rai, Ripudeep K, MD       Or   ondansetron  (ZOFRAN ) injection 4 mg  4 mg Intravenous Q6H PRN Rai, Ripudeep K, MD       Oral care mouth rinse  15 mL Mouth Rinse PRN Rai, Ripudeep K, MD       polyethylene glycol (MIRALAX  / GLYCOLAX ) packet 17 g  17 g Oral Daily PRN Rai, Ripudeep K, MD   17 g at 01/10/24 9170   senna-docusate (Senokot-S) tablet 1 tablet  1 tablet Oral BID Rai, Ripudeep K, MD   1 tablet at 01/10/24 9170   thiamine  (VITAMIN B1) tablet 100 mg  100 mg Oral Daily Rai, Ripudeep K, MD   100 mg at 01/09/24 1810   Or   thiamine  (VITAMIN B1) injection 100 mg  100 mg Intravenous Daily Rai, Ripudeep K, MD   100 mg at 01/10/24 9170   traMADol  (ULTRAM ) tablet 50 mg  50 mg Oral Q8H PRN Rai, Ripudeep K, MD   50 mg at 01/10/24 9340   zonisamide  (ZONEGRAN ) capsule 100 mg  100 mg Oral q AM Khaliqdina, Salman, MD   100 mg at 01/10/24 9171   And   zonisamide  (ZONEGRAN ) capsule 200 mg  200 mg Oral QHS Khaliqdina, Salman, MD        Review of Systems  Constitutional: positive for anorexia, fatigue, and weight loss Eyes: negative Ears, nose,  mouth, throat, and face: negative Respiratory: positive for dyspnea on exertion Cardiovascular: negative Gastrointestinal: negative Genitourinary:negative Integument/breast: negative Hematologic/lymphatic: negative Musculoskeletal:positive for muscle weakness Neurological: negative Behavioral/Psych: negative Endocrine: negative Allergic/Immunologic: negative  Physical Exam  MJO:jozmu, healthy, no distress, ill looking, and malnourished SKIN: skin color, texture, turgor are normal, no rashes or significant lesions HEAD: Normocephalic, No masses, lesions, tenderness or abnormalities EYES: normal, PERRLA, Conjunctiva are pink and non-injected EARS: External ears normal, Canals clear OROPHARYNX:no exudate, no erythema, and lips, buccal mucosa, and  tongue normal  NECK: supple, no adenopathy, no JVD LYMPH:  no palpable lymphadenopathy, no hepatosplenomegaly LUNGS: clear to auscultation , and palpation HEART: regular rate & rhythm, no murmurs, and no gallops ABDOMEN:abdomen soft, non-tender, normal bowel sounds, and no masses or organomegaly BACK: Back symmetric, no curvature., No CVA tenderness EXTREMITIES:no joint deformities, effusion, or inflammation, no edema  NEURO: alert & oriented x 3 with fluent speech, no focal motor/sensory deficits  PERFORMANCE STATUS: ECOG 1  LABORATORY DATA: Lab Results  Component Value Date   WBC 12.4 (H) 01/10/2024   HGB 12.1 (L) 01/10/2024   HCT 37.5 (L) 01/10/2024   MCV 91.0 01/10/2024   PLT 91 (L) 01/10/2024    @LASTCHEM @  RADIOGRAPHIC STUDIES: EEG adult Result Date: 01/10/2024 Shelton Arlin KIDD, MD     01/10/2024  5:53 AM Patient Name: Kellan Raffield MRN: [id number] Epilepsy Attending: Arlin KIDD Shelton Referring Physician/Provider: [name], MD Date: 01/09/2024 Duration: 25.09 mins Patient history: 73yo M with left sided weakness and seizure. EEG to evaluate for seizure. Level of alertness: Awake AEDs during EEG study: None Technical  aspects: This EEG study was done with scalp electrodes positioned according to the 10-20 International system of electrode placement. Electrical activity was reviewed with band pass filter of 1-70Hz , sensitivity of 7 uV/mm, display speed of 70mm/sec with a 60Hz  notched filter applied as appropriate. EEG data were recorded continuously and digitally stored.  Video monitoring was available and reviewed as appropriate. Description: The posterior dominant rhythm consists of 7.5 Hz activity of moderate voltage (25-35 uV) seen predominantly in posterior head regions, symmetric and reactive to eye opening and eye closing. EEG showed intermittent generalized and lateralized right hemisphere 3 to 6 Hz theta-delta slowing. Hyperventilation and photic stimulation were not performed.   ABNORMALITY - Intermittent slow, generalized and lateralized right hemisphere IMPRESSION: This study is suggestive of cortical dysfunction arising from right hemisphere likely secondary to underlying structural abnormality, post-ictal state. Additionally there is mild to moderate diffuse encephalopathy. No seizures or epileptiform discharges were seen throughout the recording. Arlin KIDD Shelton   MR BRAIN W WO CONTRAST Addendum Date: 01/10/2024 ADDENDUM REPORT: 01/10/2024 05:12 ADDENDUM: Study discussed by telephone with PA A. Chavez on 01/10/2024 at 0505 hours. Electronically Signed   By: VEAR Hurst M.D.   On: 01/10/2024 05:12   Result Date: 01/10/2024 CLINICAL DATA:  73 year old male with seizures. Metastatic pancreatic cancer. EXAM: MRI HEAD WITHOUT AND WITH CONTRAST TECHNIQUE: Multiplanar, multiecho pulse sequences of the brain and surrounding structures were obtained without and with intravenous contrast. CONTRAST:  5mL GADAVIST  GADOBUTROL  1 MMOL/ML IV SOLN COMPARISON:  Head CT yesterday. FINDINGS: Brain: Widely scattered small infarcts in the bilateral cerebellar and right greater than left cerebral hemispheres. Anterior and posterior  circulation affected, including the occipital poles. In the right hemisphere there is more conspicuous watershed pattern ischemia on series 5, image 81. The deep gray nuclei and brainstem are spared. T2 and FLAIR hyperintense cytotoxic edema in the affected areas. No acute or chronic intracranial hemorrhage identified. No abnormal enhancement identified. No dural thickening. No midline shift, mass effect, or evidence of intracranial mass lesion. No ventriculomegaly, extra-axial collection. Cervicomedullary junction and pituitary are within normal limits. And outside of the acute findings, the background gray and white matter signal is fairly normal for age. Vascular: Right ICA flow void is absent throughout the upper neck and siphon, with evidence of reconstituted flow at the right ICA terminus. Other Major intracranial vascular flow voids are preserved. Skull and upper  cervical spine: Visualized bone marrow signal is within normal limits. Negative visible cervical spine for age. Sinuses/Orbits: Negative. Other: Mastoids are clear. Visible internal auditory structures appear normal. Negative visible scalp and face. IMPRESSION: 1. Numerous small Embolic appearing acute infarcts are widely scattered in the bilateral anterior and posterior circulation. No hemorrhagic transformation or mass effect. 2. Right ICA Poor flow or Occlusion in the neck and at the skull base, with evidence of reconstituted right ICA terminus. This is age indeterminate, but most likely nonacute given the infarct pattern in #1. 3.  No metastatic disease identified. Electronically Signed: By: VEAR Hurst M.D. On: 01/10/2024 04:52   CT Angio Chest PE W and/or Wo Contrast Result Date: 01/09/2024 CLINICAL DATA:  Unilateral left-sided weakness beginning over 1 week ago. Unable to move left arm or leg. Also with cough. Patient also with abdominal pain. EXAM: CT ANGIOGRAPHY CHEST CT ABDOMEN AND PELVIS WITH CONTRAST TECHNIQUE: Multidetector CT imaging of  the chest was performed using the standard protocol during bolus administration of intravenous contrast. Multiplanar CT image reconstructions and MIPs were obtained to evaluate the vascular anatomy. Multidetector CT imaging of the abdomen and pelvis was performed using the standard protocol during bolus administration of intravenous contrast. RADIATION DOSE REDUCTION: This exam was performed according to the departmental dose-optimization program which includes automated exposure control, adjustment of the mA and/or kV according to patient size and/or use of iterative reconstruction technique. CONTRAST:  75mL OMNIPAQUE  IOHEXOL  350 MG/ML SOLN COMPARISON:  01/01/2023. FINDINGS: CTA CHEST FINDINGS Cardiovascular: Pulmonary arteries are well opacified. There is no evidence of a pulmonary embolism. Heart is normal in size. No pericardial effusion. Thoracic aorta is normal in caliber. There is aortic atherosclerosis, but no dissection. Mediastinum/Nodes: No neck base, mediastinal or hilar masses. No enlarged lymph nodes. Trachea esophagus are unremarkable. Lungs/Pleura: There is a spiculated nodule that abuts the overlying fluoro, associated with pleural thickening. Nodule measures 11 mm in greatest axial dimension and 1.4 cm in greatest dimension on the coronal sequence. This nodule has increased in size from the prior CT. And is centered on image 19, series 7. Small spiculated nodule, anterior left upper lobe, image 34, series 7, 7 x 4 mm, mean 5.5 mm, increased in size from the prior CT. 5 mm nodule, superior segment of the left lower lobe, image 28, series 7, abuts the oblique fissure. There is adjacent nodular pleural thickening of the upper aspect of the left oblique fissure. This is similar to the prior CT. Advanced centrilobular emphysema. Stable air cyst, right lower lobe. No lung consolidation or evidence of edema. No pleural effusion or pneumothorax. Musculoskeletal: No fracture or acute finding. No bone  lesion. No chest wall mass. Review of the MIP images confirms the above findings. CT ABDOMEN and PELVIS FINDINGS Hepatobiliary: Liver normal in size and overall attenuation. There are numerous small hypoattenuating masses, largest at the dome of the right lobe, 1.3 cm. Findings are consistent with metastatic disease. Gallbladder is unremarkable. No bile duct dilation. Pancreas: Pancreatic atrophy and irregular duct dilation. Both findings have progressed since the prior CT. Duct with a maximum diameter of 8 mm. Calcifications lie adjacent to the distal pancreatic and common bile duct in the pancreatic head new since the prior CT. No defined pancreatic mass. There is ill-defined soft tissue adjacent to the common bile duct along the porta hepatis. No evidence of pancreatic inflammation. Spleen: Normal in size without focal abnormality. Adrenals/Urinary Tract: No adrenal mass. Kidneys normal in overall size and position with  symmetric enhancement. There are wedge-shaped areas of hypoattenuation, anterolateral left kidney, mid to lower pole and posterior upper pole the right kidney, new since the prior CT, consistent with infarcts. No defined renal mass. No hydronephrosis. Ureters are grossly normal in course and in caliber. Bladder is unremarkable. Stomach/Bowel: Stomach is mostly decompressed, otherwise unremarkable. Grossly normal appearance of the small bowel. Right colon is mildly distended up to 5 cm. There is no wall thickening or inflammation. Vascular/Lymphatic: Prominent and mildly enlarged shoddy lymph nodes in the upper abdomen, gastrohepatic ligament, Peri celiac and aortocaval. Gastrohepatic ligament node measures largest, 1.4 cm. Nodes have increased when compared to the prior CT. Dense aortoiliac atherosclerotic calcification. No aneurysm. Reproductive: Mild stable prostate enlargement. Other: No abdominal wall hernia.  No ascites. Musculoskeletal: No fracture or acute finding. No osteoblastic or  osteolytic lesions. Review of the MIP images confirms the above findings. IMPRESSION: CTA CHEST 1. No evidence of a pulmonary embolism. 2. No acute findings. 3. 2 left upper lobe pulmonary nodules, both spiculated appearance and suspicious, largest abutting the anterior pleural surface measuring 1.4 cm in greatest dimension. Both nodules have increased in size from the previous CT and are concerning for primary lung carcinoma. 4. Advanced emphysema.  Aortic atherosclerosis. CT ABDOMEN AND PELVIS 1. No acute findings. 2. Multiple low-attenuation liver masses consistent with metastatic disease, new from the prior CT. There are also enlarged lymph nodes in the upper abdomen, largest along the gastrohepatic ligament, consistent with metastatic disease. History provided for the current head CT reports pancreatic cancer. 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 3. No defined pancreatic mass, although there is irregular dilation of the pancreatic duct that has increased when compared to the prior CT. 4. Dense aortic atherosclerosis. 5. Wedge-shaped hypoattenuating areas in both kidneys, new since the prior CT, suspected to be infarcts. Electronically Signed   By: Alm Parkins M.D.   On: 01/09/2024 14:53   CT ABDOMEN PELVIS W CONTRAST Result Date: 01/09/2024 CLINICAL DATA:  Unilateral left-sided weakness beginning over 1 week ago. Unable to move left arm or leg. Also with cough. Patient also with abdominal pain. EXAM: CT ANGIOGRAPHY CHEST CT ABDOMEN AND PELVIS WITH CONTRAST TECHNIQUE: Multidetector CT imaging of the chest was performed using the standard protocol during bolus administration of intravenous contrast. Multiplanar CT image reconstructions and MIPs were obtained to evaluate the vascular anatomy. Multidetector CT imaging of the abdomen and pelvis was performed using the standard protocol during bolus administration of intravenous contrast. RADIATION DOSE REDUCTION: This exam was performed  according to the departmental dose-optimization program which includes automated exposure control, adjustment of the mA and/or kV according to patient size and/or use of iterative reconstruction technique. CONTRAST:  75mL OMNIPAQUE  IOHEXOL  350 MG/ML SOLN COMPARISON:  01/01/2023. FINDINGS: CTA CHEST FINDINGS Cardiovascular: Pulmonary arteries are well opacified. There is no evidence of a pulmonary embolism. Heart is normal in size. No pericardial effusion. Thoracic aorta is normal in caliber. There is aortic atherosclerosis, but no dissection. Mediastinum/Nodes: No neck base, mediastinal or hilar masses. No enlarged lymph nodes. Trachea esophagus are unremarkable. Lungs/Pleura: There is a spiculated nodule that abuts the overlying fluoro, associated with pleural thickening. Nodule measures 11 mm in greatest axial dimension and 1.4 cm in greatest dimension on the coronal sequence. This nodule has increased in size from the prior CT. And is centered on image 19, series 7. Small spiculated nodule, anterior left upper lobe, image 34, series 7, 7 x 4 mm, mean 5.5 mm, increased in size from  the prior CT. 5 mm nodule, superior segment of the left lower lobe, image 28, series 7, abuts the oblique fissure. There is adjacent nodular pleural thickening of the upper aspect of the left oblique fissure. This is similar to the prior CT. Advanced centrilobular emphysema. Stable air cyst, right lower lobe. No lung consolidation or evidence of edema. No pleural effusion or pneumothorax. Musculoskeletal: No fracture or acute finding. No bone lesion. No chest wall mass. Review of the MIP images confirms the above findings. CT ABDOMEN and PELVIS FINDINGS Hepatobiliary: Liver normal in size and overall attenuation. There are numerous small hypoattenuating masses, largest at the dome of the right lobe, 1.3 cm. Findings are consistent with metastatic disease. Gallbladder is unremarkable. No bile duct dilation. Pancreas: Pancreatic atrophy  and irregular duct dilation. Both findings have progressed since the prior CT. Duct with a maximum diameter of 8 mm. Calcifications lie adjacent to the distal pancreatic and common bile duct in the pancreatic head new since the prior CT. No defined pancreatic mass. There is ill-defined soft tissue adjacent to the common bile duct along the porta hepatis. No evidence of pancreatic inflammation. Spleen: Normal in size without focal abnormality. Adrenals/Urinary Tract: No adrenal mass. Kidneys normal in overall size and position with symmetric enhancement. There are wedge-shaped areas of hypoattenuation, anterolateral left kidney, mid to lower pole and posterior upper pole the right kidney, new since the prior CT, consistent with infarcts. No defined renal mass. No hydronephrosis. Ureters are grossly normal in course and in caliber. Bladder is unremarkable. Stomach/Bowel: Stomach is mostly decompressed, otherwise unremarkable. Grossly normal appearance of the small bowel. Right colon is mildly distended up to 5 cm. There is no wall thickening or inflammation. Vascular/Lymphatic: Prominent and mildly enlarged shoddy lymph nodes in the upper abdomen, gastrohepatic ligament, Peri celiac and aortocaval. Gastrohepatic ligament node measures largest, 1.4 cm. Nodes have increased when compared to the prior CT. Dense aortoiliac atherosclerotic calcification. No aneurysm. Reproductive: Mild stable prostate enlargement. Other: No abdominal wall hernia.  No ascites. Musculoskeletal: No fracture or acute finding. No osteoblastic or osteolytic lesions. Review of the MIP images confirms the above findings. IMPRESSION: CTA CHEST 1. No evidence of a pulmonary embolism. 2. No acute findings. 3. 2 left upper lobe pulmonary nodules, both spiculated appearance and suspicious, largest abutting the anterior pleural surface measuring 1.4 cm in greatest dimension. Both nodules have increased in size from the previous CT and are concerning for  primary lung carcinoma. 4. Advanced emphysema.  Aortic atherosclerosis. CT ABDOMEN AND PELVIS 1. No acute findings. 2. Multiple low-attenuation liver masses consistent with metastatic disease, new from the prior CT. There are also enlarged lymph nodes in the upper abdomen, largest along the gastrohepatic ligament, consistent with metastatic disease. History provided for the current head CT reports pancreatic cancer. 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 3. No defined pancreatic mass, although there is irregular dilation of the pancreatic duct that has increased when compared to the prior CT. 4. Dense aortic atherosclerosis. 5. Wedge-shaped hypoattenuating areas in both kidneys, new since the prior CT, suspected to be infarcts. Electronically Signed   By: Alm Parkins M.D.   On: 01/09/2024 14:53   CT Head Wo Contrast Result Date: 01/09/2024 CLINICAL DATA:  Mental status change. Multiple seizures today. History of pancreatic carcinoma. EXAM: CT HEAD WITHOUT CONTRAST TECHNIQUE: Contiguous axial images were obtained from the base of the skull through the vertex without intravenous contrast. RADIATION DOSE REDUCTION: This exam was performed according to the departmental  dose-optimization program which includes automated exposure control, adjustment of the mA and/or kV according to patient size and/or use of iterative reconstruction technique. COMPARISON:  12/26/2020. FINDINGS: Brain: No evidence of acute infarction, hemorrhage, hydrocephalus, extra-axial collection or mass lesion/mass effect. Vascular: No hyperdense vessel or unexpected calcification. Skull: Normal. Negative for fracture or focal lesion. Sinuses/Orbits: Globes and orbits are unremarkable. Visualized sinuses are clear. Other: None. IMPRESSION: No acute intracranial abnormalities. Electronically Signed   By: Alm Parkins M.D.   On: 01/09/2024 14:27   DG Elbow Complete Left Result Date: 01/09/2024 CLINICAL DATA:  Elbow pain.  History of pancreatic cancer. No history of reported trauma EXAM: LEFT ELBOW - COMPLETE 2 VIEW COMPARISON:  None Available. FINDINGS: Hyperostosis along the olecranon. No fracture or dislocation. Grossly preserved joint spaces. No large joint effusion on the lateral view is somewhat rotated limiting evaluation for subtle joint effusion. Repeat lateral film as clinically appropriate. IMPRESSION: Grossly no acute osseous abnormality.  Limited lateral view. Electronically Signed   By: Ranell Bring M.D.   On: 01/09/2024 12:39    ASSESSMENT AND PLAN: Lung nodules Two nodules in the left lung identified on recent chest scan, larger compared to previous scan. Differential diagnosis includes primary lung cancer or metastasis from another primary site such as the pancreas. - Order tumor marker tests to determine the origin of the cancer - Perform ultrasound-guided biopsy of liver lesions to aid in diagnosis  Liver lesions, suspicious for metastasis Liver lesions identified on imaging, suspicious for metastatic disease. Differential diagnosis includes metastasis from primary lung cancer or pancreatic cancer. - Perform ultrasound-guided biopsy of liver lesions to confirm diagnosis - Order tumor marker tests to determine the origin of the cancer  Seizure Recent seizure episodes with no clear etiology identified.  Stroke MRI of the brain showed evidence of a stroke. Neurology is managing this condition. I will continue to follow-up the patient with you on as-needed basis or arrange for him a follow-up appointment at the cancer center for additional recommendation and management.   The patient voices understanding of current disease status and treatment options and is in agreement with the current care plan.  All questions were answered. The patient knows to call the clinic with any problems, questions or concerns. We can certainly see the patient much sooner if necessary.  Thank you so much for allowing  me to participate in the care of Lonnie Blair. I will continue to follow up the patient with you and assist in his care.  Disclaimer: This note was dictated with voice recognition software. Similar sounding words can inadvertently be transcribed and may not be corrected upon review.   Sherrod MARLA Sherrod January 10, 2024, 9:08 AM

## 2024-01-10 NOTE — Progress Notes (Signed)
 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   Allergies  Allergen Reactions   Tylenol  [Acetaminophen ] Itching    Patient Measurements: Height: 5' 6 (167.6 cm) Weight: 48 kg (105 lb 13.1 oz) IBW/kg (Calculated) : 63.8 HEPARIN  DW (KG): 48  Vital Signs: Temp: 98.1 F (36.7 C) (07/20 1655) Temp Source: Oral (07/20 1655) BP: 126/74 (07/20 1655) Pulse Rate: 82 (07/20 1655)  Labs: Recent Labs    01/09/24 1157 01/09/24 1901 01/10/24 0748 01/10/24 1648  HGB 11.5* 11.9* 12.1*  --   HCT 35.4* 36.6* 37.5*  --   PLT 121* 92* 91*  --   LABPROT 13.8  --   --   --   INR 1.0  --   --   --   HEPARINUNFRC  --   --   --  0.17*  CREATININE 1.03 0.88 0.80  --   CKTOTAL 94  --   --   --     Estimated Creatinine Clearance: 55.8 mL/min (by C-G formula based on SCr of 0.8 mg/dL).   Medical History: Past Medical History:  Diagnosis Date   Seizures (HCC)     Medications:  Medications Prior to Admission  Medication Sig Dispense Refill Last Dose/Taking   albuterol  (VENTOLIN  HFA) 108 (90 Base) MCG/ACT inhaler Inhale 1-2 puffs into the lungs every 6 (six) hours as needed for wheezing or shortness of breath.   Unknown   ipratropium-albuterol  (DUONEB) 0.5-2.5 (3) MG/3ML SOLN Take 3 mLs by nebulization every 6 (six) hours as needed.   01/09/2024   lactose free nutrition (BOOST PLUS) LIQD Take 237 mLs by mouth once a week. Drink 237 ml's of Strawberry Boost Plus   Past Week   zonisamide  (ZONEGRAN ) 100 MG capsule Take 100 mg by mouth in the morning and at bedtime.   01/09/2024 Morning   NICOTINE  TD Place 1 patch onto the skin daily. (Patient not taking: Reported on 01/09/2024)   Not Taking   Scheduled:   arformoterol   15 mcg Nebulization BID   atorvastatin   40 mg Oral Daily   budesonide  (PULMICORT ) nebulizer solution  0.25 mg Nebulization BID   docusate sodium   100 mg Oral BID   folic acid   1  mg Oral Daily   multivitamin with minerals  1 tablet Oral Daily   nicotine   21 mg Transdermal Daily   senna-docusate  1 tablet Oral BID   thiamine   100 mg Oral Daily   Or   thiamine   100 mg Intravenous Daily   zonisamide   100 mg Oral q AM   And   zonisamide   200 mg Oral QHS   Infusions:   ampicillin -sulbactam (UNASYN ) IV 3 g (01/10/24 1704)   heparin  600 Units/hr (01/10/24 0848)    Assessment: Patient is a 73 year old man who presented to the ED with 1 week of generalized weakness, shortness of breath, coughing, and confusion. Patient does have a history of seizure disorders. The patient had 2 seizures prior to admission and reported acute left-sided weakness to EMS. No prior history of stroke. MRI revealed multiple embolic infarcts, meaning patient is likely hypercoagulable in the setting of cancer.   Hemoglobin (11.9) is stable. Platelets (92) are slightly downtrending, continue to follow. Renal function is normal. Pharmacy consulted to manage IV heparin  therapy while inpatient. Patient was not on anticoagulation PTA.  2nd: HL is subtherapeutic at 0.17. RN reports that patient accidentally pulled out IV this morning, so  heparin  was off for a short time but has been running continuously since. Also reports a small amount of bleeding at IV site, but resolved with pressure.  Goal of Therapy:  Heparin  level 0.3-0.5 units/ml Monitor platelets by anticoagulation protocol: Yes   Plan:  Increase heparin  infusion to 700 units/hr with no bolus due to stroke. Check anti-Xa level 8 hours after rate increased. Continue to monitor H&H and platelets. Monitor for any signs or symptoms of bleeding.  Thank you for allowing pharmacy to be a part of this patient's care.  Rocky Slade, PharmD, BCPS 01/10/2024 5:43 PM  Please check AMION for all [facility] phone numbers After 10:00 PM, call Main Pharmacy [phone]

## 2024-01-10 NOTE — Progress Notes (Signed)
 PT Cancellation Note  Patient Details Name: [name] MRN: [id number] DOB: 06/29/1950   Cancelled Treatment:    Reason Eval/Treat Not Completed: Patient at procedure or test/unavailable (PT consult appreciated and chart reviewed. Pt off floor for CT. Will follow-up for PT evaulation.)  Randall SAUNDERS, PT, DPT Acute Rehabilitation Services Office: [phone] Secure Chat Preferred  Delon CHRISTELLA Callander 01/10/2024, 9:07 AM

## 2024-01-10 NOTE — Consult Note (Signed)
 Chief Complaint: Patient was seen in consultation today for liver lesion   Procedure: Liver biopsy   Referring Physician(s): Dr. Sherrod Sherrod  Supervising Physician: Philip Cornet  Patient Status: [facility] - [facility]-pt  History of Present Illness: Lonnie Blair is a 73 y.o. male with a history of seizures, COPD, alcohol and tobacco use, and previous diagnosis of pancreatic cancer a year ago for which the patient never followed up. He presented to the ED via EMS with weakness and confusion. Family reported patient had been 'out of it' for the past week and had 2 seizures, 1 witnessed, on 7/19. They also reported fever of 104F a few days prior. Wife called EMS on 7/20 when patient was noted to have acute left-sided weakness. ED workup significant for COVID+, WBC 15.0, and lactic 1.8. CTA with 2 LUL spiculated pulmonary nodules concerning for primary lung carcinoma. Subsequent CT A/P notable for multiple low-attenuation liver masses consistent with metastatic disease. Although no defined pancreatic mass noted on imaging, other finding of enlarged lymph nodes irregular dilation of pancreatic duct consistent with history of metastatic pancreatic carcinoma. IR consulted for possible liver lesion biopsy. Case and imaging reviewed and approved by Dr. DELENA Philip.   Patient is resting in bed in no acute distress with family at the bedside. Patient reports some abdominal pain for the past week, one episode of emesis last week, and worsening shortness of breath and cough. He also admits to substantial weight loss over the past year. He denies any fevers/chills, chest pain, changes in bowel/bladder habits, or blood in his stool. All questions and concerns answered at the bedside.   Code Status: Full Code  Past Medical History:  Diagnosis Date   Seizures (HCC)     Past Surgical History:  Procedure Laterality Date   OTHER SURGICAL HISTORY  2010   nasal non cancerous tumor removed    Allergies: Tylenol   [acetaminophen ]  Medications: Prior to Admission medications   Medication Sig Start Date End Date Taking? Authorizing Provider  albuterol  (VENTOLIN  HFA) 108 (90 Base) MCG/ACT inhaler Inhale 1-2 puffs into the lungs every 6 (six) hours as needed for wheezing or shortness of breath. 10/28/23  Yes [provider]  ipratropium-albuterol  (DUONEB) 0.5-2.5 (3) MG/3ML SOLN Take 3 mLs by nebulization every 6 (six) hours as needed. 10/28/23  Yes [provider]  lactose free nutrition (BOOST PLUS) LIQD Take 237 mLs by mouth once a week. Drink 237 ml's of Strawberry Boost Plus   Yes [provider]  zonisamide  (ZONEGRAN ) 100 MG capsule Take 100 mg by mouth in the morning and at bedtime.   Yes [provider]  NICOTINE  TD Place 1 patch onto the skin daily. Patient not taking: Reported on 01/09/2024    [provider]     History reviewed. No pertinent family history.  Social History   Socioeconomic History   Marital status: Married    Spouse name: Not on file   Number of children: Not on file   Years of education: Not on file   Highest education level: Not on file  Occupational History   Not on file  Tobacco Use   Smoking status: Some Days   Smokeless tobacco: Not on file  Substance and Sexual Activity   Alcohol use: Yes   Drug use: No   Sexual activity: Not on file  Other Topics Concern   Not on file  Social History Narrative   Not on file   Social Drivers of Health  Financial Resource Strain: Not on file  Food Insecurity: No Food Insecurity (01/09/2024)   Hunger Vital Sign    Worried About Running Out of Food in the Last Year: Never true    Ran Out of Food in the Last Year: Never true  Transportation Needs: No Transportation Needs (01/09/2024)   PRAPARE - Administrator, Civil Service (Medical): No    Lack of Transportation (Non-Medical): No  Physical Activity: Not on file  Stress: Not on file  Social Connections: Moderately  Integrated (01/09/2024)   Social Connection and Isolation Panel    Frequency of Communication with Friends and Family: Twice a week    Frequency of Social Gatherings with Friends and Family: Twice a week    Attends Religious Services: 1 to 4 times per year    Active Member of [employer] or Organizations: No    Attends Banker Meetings: Never    Marital Status: Married    Review of Systems  Respiratory:  Positive for shortness of breath.   Gastrointestinal:  Positive for abdominal pain.  Denies any N/V, chest pain, diarrhea, constipation, pain with inspiration, fevers/chills. All other ROS negative.  Vital Signs: BP (!) 146/87 (BP Location: Right Arm)   Pulse 77   Temp (!) 97.5 F (36.4 C)   Resp 16   Ht 5' 6 (1.676 m)   Wt 105 lb 13.1 oz (48 kg)   SpO2 98%   BMI 17.08 kg/m    Physical Exam Vitals reviewed.  Constitutional:      Comments: cachectic  HENT:     Head: Normocephalic and atraumatic.     Mouth/Throat:     Mouth: Mucous membranes are moist.     Pharynx: Oropharynx is clear.  Cardiovascular:     Rate and Rhythm: Normal rate and regular rhythm.  Pulmonary:     Effort: Pulmonary effort is normal.  Abdominal:     General: Abdomen is flat.     Palpations: Abdomen is soft.     Tenderness: There is no abdominal tenderness.  Musculoskeletal:        General: Normal range of motion.     Cervical back: Normal range of motion.  Skin:    General: Skin is warm and dry.  Neurological:     General: No focal deficit present.     Mental Status: He is alert and oriented to person, place, and time. Mental status is at baseline.  Psychiatric:        Mood and Affect: Mood normal.        Behavior: Behavior normal.        Judgment: Judgment normal.     Imaging: EEG adult Result Date: 01/10/2024 Shelton Arlin KIDD, MD     01/10/2024  5:53 AM Patient Name: Lonnie Blair MRN: [id number] Epilepsy Attending: Arlin KIDD Shelton Referring Physician/Provider: [name], MD Date: 01/09/2024 Duration: 25.09 mins Patient history: 73yo M with left sided weakness and seizure. EEG to evaluate for seizure. Level of alertness: Awake AEDs during EEG study: None Technical aspects: This EEG study was done with scalp electrodes positioned according to the 10-20 International system of electrode placement. Electrical activity was reviewed with band pass filter of 1-70Hz , sensitivity of 7 uV/mm, display speed of 5mm/sec with a 60Hz  notched filter applied as appropriate. EEG data were recorded continuously and digitally stored.  Video monitoring was available and reviewed as appropriate. Description: The posterior dominant rhythm consists of 7.5 Hz activity of moderate voltage (25-35  uV) seen predominantly in posterior head regions, symmetric and reactive to eye opening and eye closing. EEG showed intermittent generalized and lateralized right hemisphere 3 to 6 Hz theta-delta slowing. Hyperventilation and photic stimulation were not performed.   ABNORMALITY - Intermittent slow, generalized and lateralized right hemisphere IMPRESSION: This study is suggestive of cortical dysfunction arising from right hemisphere likely secondary to underlying structural abnormality, post-ictal state. Additionally there is mild to moderate diffuse encephalopathy. No seizures or epileptiform discharges were seen throughout the recording. Arlin MALVA Krebs   MR BRAIN W WO CONTRAST Addendum Date: 01/10/2024 ADDENDUM REPORT: 01/10/2024 05:12 ADDENDUM: Study discussed by telephone with PA A. Chavez on 01/10/2024 at 0505 hours. Electronically Signed   By: VEAR Hurst M.D.   On: 01/10/2024 05:12   Result Date: 01/10/2024 CLINICAL DATA:  73 year old male with seizures. Metastatic pancreatic cancer. EXAM: MRI HEAD WITHOUT AND WITH CONTRAST TECHNIQUE: Multiplanar, multiecho pulse sequences of the brain and surrounding structures were obtained without and with intravenous contrast. CONTRAST:  5mL GADAVIST  GADOBUTROL  1  MMOL/ML IV SOLN COMPARISON:  Head CT yesterday. FINDINGS: Brain: Widely scattered small infarcts in the bilateral cerebellar and right greater than left cerebral hemispheres. Anterior and posterior circulation affected, including the occipital poles. In the right hemisphere there is more conspicuous watershed pattern ischemia on series 5, image 81. The deep gray nuclei and brainstem are spared. T2 and FLAIR hyperintense cytotoxic edema in the affected areas. No acute or chronic intracranial hemorrhage identified. No abnormal enhancement identified. No dural thickening. No midline shift, mass effect, or evidence of intracranial mass lesion. No ventriculomegaly, extra-axial collection. Cervicomedullary junction and pituitary are within normal limits. And outside of the acute findings, the background gray and white matter signal is fairly normal for age. Vascular: Right ICA flow void is absent throughout the upper neck and siphon, with evidence of reconstituted flow at the right ICA terminus. Other Major intracranial vascular flow voids are preserved. Skull and upper cervical spine: Visualized bone marrow signal is within normal limits. Negative visible cervical spine for age. Sinuses/Orbits: Negative. Other: Mastoids are clear. Visible internal auditory structures appear normal. Negative visible scalp and face. IMPRESSION: 1. Numerous small Embolic appearing acute infarcts are widely scattered in the bilateral anterior and posterior circulation. No hemorrhagic transformation or mass effect. 2. Right ICA Poor flow or Occlusion in the neck and at the skull base, with evidence of reconstituted right ICA terminus. This is age indeterminate, but most likely nonacute given the infarct pattern in #1. 3.  No metastatic disease identified. Electronically Signed: By: VEAR Hurst M.D. On: 01/10/2024 04:52   CT Angio Chest PE W and/or Wo Contrast Result Date: 01/09/2024 CLINICAL DATA:  Unilateral left-sided weakness beginning over  1 week ago. Unable to move left arm or leg. Also with cough. Patient also with abdominal pain. EXAM: CT ANGIOGRAPHY CHEST CT ABDOMEN AND PELVIS WITH CONTRAST TECHNIQUE: Multidetector CT imaging of the chest was performed using the standard protocol during bolus administration of intravenous contrast. Multiplanar CT image reconstructions and MIPs were obtained to evaluate the vascular anatomy. Multidetector CT imaging of the abdomen and pelvis was performed using the standard protocol during bolus administration of intravenous contrast. RADIATION DOSE REDUCTION: This exam was performed according to the departmental dose-optimization program which includes automated exposure control, adjustment of the mA and/or kV according to patient size and/or use of iterative reconstruction technique. CONTRAST:  75mL OMNIPAQUE  IOHEXOL  350 MG/ML SOLN COMPARISON:  01/01/2023. FINDINGS: CTA CHEST FINDINGS Cardiovascular: Pulmonary arteries are well  opacified. There is no evidence of a pulmonary embolism. Heart is normal in size. No pericardial effusion. Thoracic aorta is normal in caliber. There is aortic atherosclerosis, but no dissection. Mediastinum/Nodes: No neck base, mediastinal or hilar masses. No enlarged lymph nodes. Trachea esophagus are unremarkable. Lungs/Pleura: There is a spiculated nodule that abuts the overlying fluoro, associated with pleural thickening. Nodule measures 11 mm in greatest axial dimension and 1.4 cm in greatest dimension on the coronal sequence. This nodule has increased in size from the prior CT. And is centered on image 19, series 7. Small spiculated nodule, anterior left upper lobe, image 34, series 7, 7 x 4 mm, mean 5.5 mm, increased in size from the prior CT. 5 mm nodule, superior segment of the left lower lobe, image 28, series 7, abuts the oblique fissure. There is adjacent nodular pleural thickening of the upper aspect of the left oblique fissure. This is similar to the prior CT. Advanced  centrilobular emphysema. Stable air cyst, right lower lobe. No lung consolidation or evidence of edema. No pleural effusion or pneumothorax. Musculoskeletal: No fracture or acute finding. No bone lesion. No chest wall mass. Review of the MIP images confirms the above findings. CT ABDOMEN and PELVIS FINDINGS Hepatobiliary: Liver normal in size and overall attenuation. There are numerous small hypoattenuating masses, largest at the dome of the right lobe, 1.3 cm. Findings are consistent with metastatic disease. Gallbladder is unremarkable. No bile duct dilation. Pancreas: Pancreatic atrophy and irregular duct dilation. Both findings have progressed since the prior CT. Duct with a maximum diameter of 8 mm. Calcifications lie adjacent to the distal pancreatic and common bile duct in the pancreatic head new since the prior CT. No defined pancreatic mass. There is ill-defined soft tissue adjacent to the common bile duct along the porta hepatis. No evidence of pancreatic inflammation. Spleen: Normal in size without focal abnormality. Adrenals/Urinary Tract: No adrenal mass. Kidneys normal in overall size and position with symmetric enhancement. There are wedge-shaped areas of hypoattenuation, anterolateral left kidney, mid to lower pole and posterior upper pole the right kidney, new since the prior CT, consistent with infarcts. No defined renal mass. No hydronephrosis. Ureters are grossly normal in course and in caliber. Bladder is unremarkable. Stomach/Bowel: Stomach is mostly decompressed, otherwise unremarkable. Grossly normal appearance of the small bowel. Right colon is mildly distended up to 5 cm. There is no wall thickening or inflammation. Vascular/Lymphatic: Prominent and mildly enlarged shoddy lymph nodes in the upper abdomen, gastrohepatic ligament, Peri celiac and aortocaval. Gastrohepatic ligament node measures largest, 1.4 cm. Nodes have increased when compared to the prior CT. Dense aortoiliac  atherosclerotic calcification. No aneurysm. Reproductive: Mild stable prostate enlargement. Other: No abdominal wall hernia.  No ascites. Musculoskeletal: No fracture or acute finding. No osteoblastic or osteolytic lesions. Review of the MIP images confirms the above findings. IMPRESSION: CTA CHEST 1. No evidence of a pulmonary embolism. 2. No acute findings. 3. 2 left upper lobe pulmonary nodules, both spiculated appearance and suspicious, largest abutting the anterior pleural surface measuring 1.4 cm in greatest dimension. Both nodules have increased in size from the previous CT and are concerning for primary lung carcinoma. 4. Advanced emphysema.  Aortic atherosclerosis. CT ABDOMEN AND PELVIS 1. No acute findings. 2. Multiple low-attenuation liver masses consistent with metastatic disease, new from the prior CT. There are also enlarged lymph nodes in the upper abdomen, largest along the gastrohepatic ligament, consistent with metastatic disease. History provided for the current head CT reports pancreatic cancer. Findings  are consistent with metastatic pancreatic carcinoma. 3. No defined pancreatic mass, although there is irregular dilation of the pancreatic duct that has increased when compared to the prior CT. 4. Dense aortic atherosclerosis. 5. Wedge-shaped hypoattenuating areas in both kidneys, new since the prior CT, suspected to be infarcts. Electronically Signed   By: Alm Parkins M.D.   On: 01/09/2024 14:53   CT ABDOMEN PELVIS W CONTRAST Result Date: 01/09/2024 CLINICAL DATA:  Unilateral left-sided weakness beginning over 1 week ago. Unable to move left arm or leg. Also with cough. Patient also with abdominal pain. EXAM: CT ANGIOGRAPHY CHEST CT ABDOMEN AND PELVIS WITH CONTRAST TECHNIQUE: Multidetector CT imaging of the chest was performed using the standard protocol during bolus administration of intravenous contrast. Multiplanar CT image reconstructions and MIPs were obtained to evaluate the vascular  anatomy. Multidetector CT imaging of the abdomen and pelvis was performed using the standard protocol during bolus administration of intravenous contrast. RADIATION DOSE REDUCTION: This exam was performed according to the departmental dose-optimization program which includes automated exposure control, adjustment of the mA and/or kV according to patient size and/or use of iterative reconstruction technique. CONTRAST:  75mL OMNIPAQUE  IOHEXOL  350 MG/ML SOLN COMPARISON:  01/01/2023. FINDINGS: CTA CHEST FINDINGS Cardiovascular: Pulmonary arteries are well opacified. There is no evidence of a pulmonary embolism. Heart is normal in size. No pericardial effusion. Thoracic aorta is normal in caliber. There is aortic atherosclerosis, but no dissection. Mediastinum/Nodes: No neck base, mediastinal or hilar masses. No enlarged lymph nodes. Trachea esophagus are unremarkable. Lungs/Pleura: There is a spiculated nodule that abuts the overlying fluoro, associated with pleural thickening. Nodule measures 11 mm in greatest axial dimension and 1.4 cm in greatest dimension on the coronal sequence. This nodule has increased in size from the prior CT. And is centered on image 19, series 7. Small spiculated nodule, anterior left upper lobe, image 34, series 7, 7 x 4 mm, mean 5.5 mm, increased in size from the prior CT. 5 mm nodule, superior segment of the left lower lobe, image 28, series 7, abuts the oblique fissure. There is adjacent nodular pleural thickening of the upper aspect of the left oblique fissure. This is similar to the prior CT. Advanced centrilobular emphysema. Stable air cyst, right lower lobe. No lung consolidation or evidence of edema. No pleural effusion or pneumothorax. Musculoskeletal: No fracture or acute finding. No bone lesion. No chest wall mass. Review of the MIP images confirms the above findings. CT ABDOMEN and PELVIS FINDINGS Hepatobiliary: Liver normal in size and overall attenuation. There are numerous  small hypoattenuating masses, largest at the dome of the right lobe, 1.3 cm. Findings are consistent with metastatic disease. Gallbladder is unremarkable. No bile duct dilation. Pancreas: Pancreatic atrophy and irregular duct dilation. Both findings have progressed since the prior CT. Duct with a maximum diameter of 8 mm. Calcifications lie adjacent to the distal pancreatic and common bile duct in the pancreatic head new since the prior CT. No defined pancreatic mass. There is ill-defined soft tissue adjacent to the common bile duct along the porta hepatis. No evidence of pancreatic inflammation. Spleen: Normal in size without focal abnormality. Adrenals/Urinary Tract: No adrenal mass. Kidneys normal in overall size and position with symmetric enhancement. There are wedge-shaped areas of hypoattenuation, anterolateral left kidney, mid to lower pole and posterior upper pole the right kidney, new since the prior CT, consistent with infarcts. No defined renal mass. No hydronephrosis. Ureters are grossly normal in course and in caliber. Bladder is unremarkable. Stomach/Bowel:  Stomach is mostly decompressed, otherwise unremarkable. Grossly normal appearance of the small bowel. Right colon is mildly distended up to 5 cm. There is no wall thickening or inflammation. Vascular/Lymphatic: Prominent and mildly enlarged shoddy lymph nodes in the upper abdomen, gastrohepatic ligament, Peri celiac and aortocaval. Gastrohepatic ligament node measures largest, 1.4 cm. Nodes have increased when compared to the prior CT. Dense aortoiliac atherosclerotic calcification. No aneurysm. Reproductive: Mild stable prostate enlargement. Other: No abdominal wall hernia.  No ascites. Musculoskeletal: No fracture or acute finding. No osteoblastic or osteolytic lesions. Review of the MIP images confirms the above findings. IMPRESSION: CTA CHEST 1. No evidence of a pulmonary embolism. 2. No acute findings. 3. 2 left upper lobe pulmonary nodules,  both spiculated appearance and suspicious, largest abutting the anterior pleural surface measuring 1.4 cm in greatest dimension. Both nodules have increased in size from the previous CT and are concerning for primary lung carcinoma. 4. Advanced emphysema.  Aortic atherosclerosis. CT ABDOMEN AND PELVIS 1. No acute findings. 2. Multiple low-attenuation liver masses consistent with metastatic disease, new from the prior CT. There are also enlarged lymph nodes in the upper abdomen, largest along the gastrohepatic ligament, consistent with metastatic disease. History provided for the current head CT reports pancreatic cancer. 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 3. No defined pancreatic mass, although there is irregular dilation of the pancreatic duct that has increased when compared to the prior CT. 4. Dense aortic atherosclerosis. 5. Wedge-shaped hypoattenuating areas in both kidneys, new since the prior CT, suspected to be infarcts. Electronically Signed   By: Alm Parkins M.D.   On: 01/09/2024 14:53   CT Head Wo Contrast Result Date: 01/09/2024 CLINICAL DATA:  Mental status change. Multiple seizures today. History of pancreatic carcinoma. EXAM: CT HEAD WITHOUT CONTRAST TECHNIQUE: Contiguous axial images were obtained from the base of the skull through the vertex without intravenous contrast. RADIATION DOSE REDUCTION: This exam was performed according to the departmental dose-optimization program which includes automated exposure control, adjustment of the mA and/or kV according to patient size and/or use of iterative reconstruction technique. COMPARISON:  12/26/2020. FINDINGS: Brain: No evidence of acute infarction, hemorrhage, hydrocephalus, extra-axial collection or mass lesion/mass effect. Vascular: No hyperdense vessel or unexpected calcification. Skull: Normal. Negative for fracture or focal lesion. Sinuses/Orbits: Globes and orbits are unremarkable. Visualized sinuses are clear.  Other: None. IMPRESSION: No acute intracranial abnormalities. Electronically Signed   By: Alm Parkins M.D.   On: 01/09/2024 14:27   DG Elbow Complete Left Result Date: 01/09/2024 CLINICAL DATA:  Elbow pain. History of pancreatic cancer. No history of reported trauma EXAM: LEFT ELBOW - COMPLETE 2 VIEW COMPARISON:  None Available. FINDINGS: Hyperostosis along the olecranon. No fracture or dislocation. Grossly preserved joint spaces. No large joint effusion on the lateral view is somewhat rotated limiting evaluation for subtle joint effusion. Repeat lateral film as clinically appropriate. IMPRESSION: Grossly no acute osseous abnormality.  Limited lateral view. Electronically Signed   By: Ranell Bring M.D.   On: 01/09/2024 12:39    Labs:  CBC: Recent Labs    01/09/24 1157 01/09/24 1901 01/10/24 0748  WBC 15.0* 13.1* 12.4*  HGB 11.5* 11.9* 12.1*  HCT 35.4* 36.6* 37.5*  PLT 121* 92* 91*    COAGS: Recent Labs    01/09/24 1157  INR 1.0    BMP: Recent Labs    01/09/24 1157 01/09/24 1901 01/10/24 0748  NA 137  --  137  K 4.9  --  3.6  CL 100  --  103  CO2 25  --  20*  GLUCOSE 84  --  73  BUN 19  --  11  CALCIUM  9.1  --  8.9  CREATININE 1.03 0.88 0.80  GFRNONAA >60 >60 >60    LIVER FUNCTION TESTS: Recent Labs    01/09/24 1157 01/10/24 0748  BILITOT 0.4 0.6  AST 52* 48*  ALT 28 25  ALKPHOS 117 121  PROT 6.9 6.8  ALBUMIN 3.1* 2.9*    TUMOR MARKERS: No results for input(s): AFPTM, CEA, CA199, CHROMGRNA in the last 8760 hours.  Assessment and Plan:  LUL pulmonary nodules and liver lesions concerning for metastatic cancer: Lonnie Blair is a 73 y.o. male with a history of seizures, COPD, alcohol and tobacco use, and previous diagnosis of pancreatic cancer a year ago who presented to the ED with weakness and confusion. Workup concerning for primary lung cancer and possible metastatic pancreatic cancer. IR consulted for possible liver lesion biopsy for diagnostic  clarification.   -On heparin  infusion; will need to hold at least 4hr prior to procedure -INR on 7/19 1.0; repeat ordered -CBC 12.4 < 15.0 on 7/19 -Tumor markers pending -Plan for liver lesion biopsy possibly on 7/21 if schedule permits   Risks and benefits of liver lesion biopsy was discussed with the patient and/or patient's family including, but not limited to bleeding, infection, damage to adjacent structures or low yield requiring additional tests.  All of the questions were answered and there is agreement to proceed.  Consent signed and in APP office.   Thank you for this interesting consult. I greatly enjoyed meeting Makarios Madlock and look forward to participating in their care. A copy of this report was sent to the requesting provider on this date.  Electronically Signed: Glennon CHRISTELLA Bal, PA-C 01/10/2024, 11:40 AM   I spent a total of 40 Minutes in face to face clinical consultation, greater than 50% of which was counseling/coordinating care for liver biopsy.

## 2024-01-10 NOTE — Evaluation (Signed)
 Clinical/Bedside Swallow Evaluation Patient Details  Name: Lonnie Blair MRN: [id number] Date of Birth: [birth year]  Today's Date: 01/10/2024 Time: SLP Start Time (ACUTE ONLY): 0815 SLP Stop Time (ACUTE ONLY): 9167 SLP Time Calculation (min) (ACUTE ONLY): 17 min  Past Medical History:  Past Medical History:  Diagnosis Date   Seizures (HCC)    Past Surgical History:  Past Surgical History:  Procedure Laterality Date   OTHER SURGICAL HISTORY  2010   nasal non cancerous tumor removed   HPI:  Lonnie Blair is a 73 y.o. male with hx of COPD, tobacco use, alcohol use, lung nodule, seizures on zonisamide  who presents with generalized weakness, cough, shortness of breath and confusion.  Reportedly had 104 fever at home, has been very tired and somnolent with nausea and vomiting and 2 seizures at home. Workup in the ED with WBC of 15.  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 He is positive for COVID. 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 Cough also noted in absence of PO consumption making it difficult to differentiate etiology. Pt noted to have active COVID, hx of emphysema, amongst other respiratory conditions. Admission also notable for numerous small Embolic appearing acute infarcts are widely  scattered in the bilateral anterior and posterior circulation. Pt with palpable swallows, able to masticate POs. Will continue to closely monitor for diet tolerance and or need for any future instrumental swallow study as clinically indicated. Continue dysphagia 3 (mechanically soft diet) with meds as tolerated.  SLP Visit Diagnosis: Dysphagia, unspecified (R13.10)    Aspiration Risk  Mild aspiration risk    Diet Recommendation   Thin;Dysphagia 3  (mechanical soft)  Medication Administration: Whole meds with puree    Other  Recommendations Oral Care Recommendations: Oral care BID     Assistance Recommended at Discharge    Functional Status Assessment Patient has had a recent decline in their functional status and demonstrates the ability to make significant improvements in function in a reasonable and predictable amount of time.  Frequency and Duration min 1 x/week  2 weeks       Prognosis Prognosis for improved oropharyngeal function: Fair Barriers to Reach Goals: Severity of deficits (comorbidities)      Swallow Study   General Date of Onset: 01/09/24 HPI: Lonnie Blair is a 73 y.o. male with hx of COPD, tobacco use, alcohol use, lung nodule, seizures on zonisamide  who presents with generalized weakness, cough, shortness of breath and confusion.  Reportedly had 104 fever at home, has been very tired and somnolent with nausea and vomiting and 2 seizures at home. Workup in the ED with WBC of 15.  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 He is positive for COVID. 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 Type of Study: Bedside Swallow Evaluation Previous Swallow Assessment: none on file Diet Prior to this Study: Dysphagia 3 (mechanical soft);Thin liquids (Level 0) Temperature Spikes Noted: No Respiratory Status: Room air History of Recent Intubation: No Behavior/Cognition: Alert;Cooperative Oral Cavity Assessment: Within Functional Limits Oral Care Completed by SLP: No Oral Cavity - Dentition: Missing dentition Vision: Functional for self-feeding Self-Feeding Abilities: Needs assist Patient Positioning: Upright in bed Baseline Vocal Quality: Normal Volitional Cough: Congested;Weak Volitional  Swallow: Able to elicit    Oral/Motor/Sensory Function Overall Oral Motor/Sensory Function: Generalized oral weakness    Ice Chips Ice chips: Impaired Presentation: Spoon Oral Phase Impairments: Reduced lingual movement/coordination Oral Phase Functional Implications: Prolonged oral transit Pharyngeal Phase Impairments: Suspected delayed Swallow;Multiple swallows;Cough - Delayed   Thin Liquid Thin Liquid: Impaired Presentation: Straw Pharyngeal  Phase Impairments: Suspected delayed Swallow;Multiple swallows;Cough - Delayed    Nectar Thick Nectar Thick Liquid: Not tested   Honey Thick Honey Thick Liquid: Not tested   Puree Puree: Impaired Presentation: Spoon Pharyngeal Phase Impairments: Suspected delayed Swallow;Cough - Delayed   Solid     Solid: Impaired Presentation: Spoon Oral Phase Impairments: Reduced lingual movement/coordination Pharyngeal Phase Impairments: Suspected delayed Swallow;Multiple swallows;Cough - Delayed     Kiriana Worthington H. MA, CCC-SLP Acute Rehabilitation Services   01/10/2024,8:41 AM

## 2024-01-10 NOTE — Progress Notes (Addendum)
 Rosaria Aid, son, and pt's spouse, Shields Pautz, requested visitors to be restricted.   Patient Password set up with son, Rosaria.  This RN informed Rosaria only the people he shares this password with will be allowed up and provided with information.

## 2024-01-10 NOTE — Progress Notes (Signed)
 Order in for NIHSS assessement ,since this RN is not NIHSS certified unable to perform ,notified the charge nurse about this.

## 2024-01-10 NOTE — Care Plan (Signed)
 Rapid Response Consulted to perform NIH assessments.  Care RN not NIH certified. Consulting civil engineer not NIH certified.

## 2024-01-10 NOTE — Procedures (Signed)
 Patient Name: Lonnie Blair  MRN: [id number]  Epilepsy Attending: Arlin MALVA Krebs  Referring Physician/Provider: [name], MD Date: 01/09/2024 Duration: 25.09 mins  Patient history: 73yo M with left sided weakness and seizure. EEG to evaluate for seizure.  Level of alertness: Awake  AEDs during EEG study: None  Technical aspects: This EEG study was done with scalp electrodes positioned according to the 10-20 International system of electrode placement. Electrical activity was reviewed with band pass filter of 1-70Hz , sensitivity of 7 uV/mm, display speed of 7mm/sec with a 60Hz  notched filter applied as appropriate. EEG data were recorded continuously and digitally stored.  Video monitoring was available and reviewed as appropriate.  Description: The posterior dominant rhythm consists of 7.5 Hz activity of moderate voltage (25-35 uV) seen predominantly in posterior head regions, symmetric and reactive to eye opening and eye closing. EEG showed intermittent generalized and lateralized right hemisphere 3 to 6 Hz theta-delta slowing. Hyperventilation and photic stimulation were not performed.     ABNORMALITY - Intermittent slow, generalized and lateralized right hemisphere   IMPRESSION: This study is suggestive of cortical dysfunction arising from right hemisphere likely secondary to underlying structural abnormality, post-ictal state. Additionally there is  mild to moderate diffuse encephalopathy. No seizures or epileptiform discharges were seen throughout the recording.  Lonnie Blair

## 2024-01-10 NOTE — Progress Notes (Addendum)
 STROKE TEAM PROGRESS NOTE    SIGNIFICANT HOSPITAL EVENTS 7/19: Presented with generalized weakness, cough, shortness of breath, confusion.  Reports of somnolence, nausea, vomiting, seizures at home.  Routine EEG negative.  Leukocytosis on admission.  Positive for COVID. MRI brain: Bilateral embolic appearing infarcts multiple vascular distributions.  INTERIM HISTORY/SUBJECTIVE  On exam, patient is ill-appearing, no focal weakness or confusion. Infarcts appear embolic, patient is on Heparin  IV.   Pending TE, DVT US .  Pending IR consult for Liver Biopsy.  OBJECTIVE  CBC    Component Value Date/Time   WBC 12.4 (H) 01/10/2024 0748   RBC 4.12 (L) 01/10/2024 0748   HGB 12.1 (L) 01/10/2024 0748   HCT 37.5 (L) 01/10/2024 0748   PLT 91 (L) 01/10/2024 0748   MCV 91.0 01/10/2024 0748   MCH 29.4 01/10/2024 0748   MCHC 32.3 01/10/2024 0748   RDW 13.3 01/10/2024 0748   LYMPHSABS 2.1 01/09/2024 1157   MONOABS 1.1 (H) 01/09/2024 1157   EOSABS 0.4 01/09/2024 1157   BASOSABS 0.0 01/09/2024 1157    BMET    Component Value Date/Time   NA 137 01/10/2024 0748   K 3.6 01/10/2024 0748   CL 103 01/10/2024 0748   CO2 20 (L) 01/10/2024 0748   GLUCOSE 73 01/10/2024 0748   BUN 11 01/10/2024 0748   CREATININE 0.80 01/10/2024 0748   CALCIUM  8.9 01/10/2024 0748   GFRNONAA >60 01/10/2024 0748    IMAGING past 24 hours CT ANGIO HEAD NECK W WO CM Addendum Date: 01/10/2024 ADDENDUM REPORT: 01/10/2024 12:03 ADDENDUM: Study discussed by telephone with Dr. Jerri on 01/10/2024 at 1156 hours. Electronically Signed   By: VEAR Hurst M.D.   On: 01/10/2024 12:03   Result Date: 01/10/2024 CLINICAL DATA:  73 year old male with seizures. Metastatic pancreatic cancer. Numerous small embolic appearing infarcts on brain MRI this morning, and evidence of right ICA poor flow or occlusion on that exam. EXAM: CT ANGIOGRAPHY HEAD AND NECK WITH AND WITHOUT CONTRAST TECHNIQUE: Multidetector CT imaging of the head and neck was  performed using the standard protocol during bolus administration of intravenous contrast. Multiplanar CT image reconstructions and MIPs were obtained to evaluate the vascular anatomy. Carotid stenosis measurements (when applicable) are obtained utilizing NASCET criteria, using the distal internal carotid diameter as the denominator. RADIATION DOSE REDUCTION: This exam was performed according to the departmental dose-optimization program which includes automated exposure control, adjustment of the mA and/or kV according to patient size and/or use of iterative reconstruction technique. CONTRAST:  75mL OMNIPAQUE  IOHEXOL  350 MG/ML SOLN COMPARISON:  Brain MRI 0413 hours today.  Head CT yesterday. CTA chest yesterday. FINDINGS: CT HEAD Brain: Numerous small scattered acute infarcts are mostly occult by CT. No acute intracranial hemorrhage identified. No midline shift, mass effect, or evidence of intracranial mass lesion. No ventriculomegaly. Calvarium and skull base: Intact. No acute or suspicious osseous lesion. Paranasal sinuses: Visualized paranasal sinuses and mastoids are stable and well aerated. Orbits: No acute orbit or scalp soft tissue finding. CTA NECK Skeleton: Carious dentition. No acute or suspicious osseous lesion identified in the neck. Partially visible mild thoracic scoliosis. Upper chest: Moderate to severe emphysema. Apical lung scarring. Spiculated anterior left upper lobe lung nodule measuring 7 mm on series 11, image 163. And a larger spiculated subpleural left upper lobe lung nodule on this exam is mostly obscured by dense left subclavian venous contrast streak artifact on series 11, image 48. See Chest CTA reported yesterday. Mediastinal lymph nodes within normal limits. Visible central pulmonary  arteries appear patent. Other neck: Nonvascular neck soft tissue spaces are within normal limits. Aortic arch: Calcified aortic atherosclerosis.  3 vessel arch. Right carotid system: Patent  brachiocephalic artery and right CCA with minimal plaque and no stenosis. Right ICA is gradually occluded beginning at the bulb, with evidence of stringy thrombus within the ICA origin on series 11, image 113. No reconstitution at the skull base. Left carotid system: Patent left CCA origin. No significant left CCA plaque or stenosis. Patent left carotid bifurcation with minor irregularity. Patent left ICA to the skull base without stenosis. Vertebral arteries: Right subclavian origin calcified plaque without stenosis. Normal right vertebral artery origin. Right vertebral artery is patent and normal to the skull base. Proximal left subclavian artery soft and calcified plaque without stenosis. Mild calcified plaque at the left vertebral artery origin without stenosis. Mildly non dominant left vertebral artery is patent and normal to the skull base. CTA HEAD Posterior circulation: Patent codominant distal vertebral arteries, somewhat diminutive along with the vertebrobasilar junction. But patent without plaque or stenosis identified. PICA origins are patent. Patent and tortuous basilar artery, diminutive but without stenosis. Patent SCA origins. Fetal type bilateral PCA origins. Left PCA branches are within normal limits. Right posterior communicating artery is hypoenhancing anteriorly near the junction with the abnormal right ICA terminus (series 16, image 39). But otherwise the right MCA branches are within normal limits. Anterior circulation: No reconstitution of the right ICA siphon which is atherosclerotic. Reconstituted right MCA and ACA origins with mild irregularity there on series 15, image 39. Anterior communicating artery is present and bilateral A1 enhancement is symmetric. Bilateral ACA branches appear patent and within normal limits. Left ICA siphon is patent with calcified plaque, but no significant left siphon stenosis. Normal left posterior communicating artery origin. The on the right MCA origin  irregularity the right M1 segment is enhancing symmetrically, within normal limits. Bilateral MCA M1 segments and MCA bifurcations appear patent without stenosis. No MCA branch occlusion is identified on either side. Venous sinuses: Early contrast timing, not well evaluated. Anatomic variants: Fetal type bilateral PCA origins. Review of the MIP images confirms the above findings IMPRESSION: 1. 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 No reconstitution through the right ICA supraclinoid segment. 2. Right MCA and ACA origins are reconstituted with mild irregularity there. But otherwise symmetric anterior circulation enhancement and no anterior circulation branch occlusion identified. 3. Patent but diminutive posterior circulation appears to be on the basis of fetal type PCA origins. No significant posterior circulation stenosis. 4. Numerous small embolic appearing infarcts on brain MRI this morning are largely occult by CT. No hemorrhagic transformation or intracranial mass effect. 5. Spiculated left upper lobe lung nodules, see Chest CTA reported yesterday. Aortic Atherosclerosis (ICD10-I70.0) and Emphysema (ICD10-J43.9). Electronically Signed: By: VEAR Hurst M.D. On: 01/10/2024 11:55   EEG adult Result Date: 01/10/2024 Shelton Arlin KIDD, MD     01/10/2024  5:53 AM Patient Name: Casy Brunetto MRN: [id number] Epilepsy Attending: Arlin KIDD Shelton Referring Physician/Provider: [name], MD Date: 01/09/2024 Duration: 25.09 mins Patient history: 73yo M with left sided weakness and seizure. EEG to evaluate for seizure. Level of alertness: Awake AEDs during EEG study: None Technical aspects: This EEG study was done with scalp electrodes positioned according to the 10-20 International system of electrode placement. Electrical activity was reviewed with band pass filter of 1-70Hz , sensitivity of 7  uV/mm,  display speed of 89mm/sec with a 60Hz  notched filter applied as appropriate. EEG data were recorded continuously and digitally stored.  Video monitoring was available and reviewed as appropriate. Description: The posterior dominant rhythm consists of 7.5 Hz activity of moderate voltage (25-35 uV) seen predominantly in posterior head regions, symmetric and reactive to eye opening and eye closing. EEG showed intermittent generalized and lateralized right hemisphere 3 to 6 Hz theta-delta slowing. Hyperventilation and photic stimulation were not performed.   ABNORMALITY - Intermittent slow, generalized and lateralized right hemisphere IMPRESSION: This study is suggestive of cortical dysfunction arising from right hemisphere likely secondary to underlying structural abnormality, post-ictal state. Additionally there is mild to moderate diffuse encephalopathy. No seizures or epileptiform discharges were seen throughout the recording. Arlin MALVA Krebs   MR BRAIN W WO CONTRAST Addendum Date: 01/10/2024 ADDENDUM REPORT: 01/10/2024 05:12 ADDENDUM: Study discussed by telephone with PA A. Chavez on 01/10/2024 at 0505 hours. Electronically Signed   By: VEAR Hurst M.D.   On: 01/10/2024 05:12   Result Date: 01/10/2024 CLINICAL DATA:  73 year old male with seizures. Metastatic pancreatic cancer. EXAM: MRI HEAD WITHOUT AND WITH CONTRAST TECHNIQUE: Multiplanar, multiecho pulse sequences of the brain and surrounding structures were obtained without and with intravenous contrast. CONTRAST:  5mL GADAVIST  GADOBUTROL  1 MMOL/ML IV SOLN COMPARISON:  Head CT yesterday. FINDINGS: Brain: Widely scattered small infarcts in the bilateral cerebellar and right greater than left cerebral hemispheres. Anterior and posterior circulation affected, including the occipital poles. In the right hemisphere there is more conspicuous watershed pattern ischemia on series 5, image 81. The deep gray nuclei and brainstem are spared. T2 and FLAIR hyperintense  cytotoxic edema in the affected areas. No acute or chronic intracranial hemorrhage identified. No abnormal enhancement identified. No dural thickening. No midline shift, mass effect, or evidence of intracranial mass lesion. No ventriculomegaly, extra-axial collection. Cervicomedullary junction and pituitary are within normal limits. And outside of the acute findings, the background gray and white matter signal is fairly normal for age. Vascular: Right ICA flow void is absent throughout the upper neck and siphon, with evidence of reconstituted flow at the right ICA terminus. Other Major intracranial vascular flow voids are preserved. Skull and upper cervical spine: Visualized bone marrow signal is within normal limits. Negative visible cervical spine for age. Sinuses/Orbits: Negative. Other: Mastoids are clear. Visible internal auditory structures appear normal. Negative visible scalp and face. IMPRESSION: 1. Numerous small Embolic appearing acute infarcts are widely scattered in the bilateral anterior and posterior circulation. No hemorrhagic transformation or mass effect. 2. Right ICA Poor flow or Occlusion in the neck and at the skull base, with evidence of reconstituted right ICA terminus. This is age indeterminate, but most likely nonacute given the infarct pattern in #1. 3.  No metastatic disease identified. Electronically Signed: By: VEAR Hurst M.D. On: 01/10/2024 04:52   CT ABDOMEN PELVIS W CONTRAST Result Date: 01/09/2024 CLINICAL DATA:  Unilateral left-sided weakness beginning over 1 week ago. Unable to move left arm or leg. Also with cough. Patient also with abdominal pain. EXAM: CT ANGIOGRAPHY CHEST CT ABDOMEN AND PELVIS WITH CONTRAST TECHNIQUE: Multidetector CT imaging of the chest was performed using the standard protocol during bolus administration of intravenous contrast. Multiplanar CT image reconstructions and MIPs were obtained to evaluate the vascular anatomy. Multidetector CT imaging of the  abdomen and pelvis was performed using the standard protocol during bolus administration of intravenous contrast. RADIATION DOSE REDUCTION: This exam was performed according to the departmental dose-optimization  program which includes automated exposure control, adjustment of the mA and/or kV according to patient size and/or use of iterative reconstruction technique. CONTRAST:  75mL OMNIPAQUE  IOHEXOL  350 MG/ML SOLN COMPARISON:  01/01/2023. FINDINGS: CTA CHEST FINDINGS Cardiovascular: Pulmonary arteries are well opacified. There is no evidence of a pulmonary embolism. Heart is normal in size. No pericardial effusion. Thoracic aorta is normal in caliber. There is aortic atherosclerosis, but no dissection. Mediastinum/Nodes: No neck base, mediastinal or hilar masses. No enlarged lymph nodes. Trachea esophagus are unremarkable. Lungs/Pleura: There is a spiculated nodule that abuts the overlying fluoro, associated with pleural thickening. Nodule measures 11 mm in greatest axial dimension and 1.4 cm in greatest dimension on the coronal sequence. This nodule has increased in size from the prior CT. And is centered on image 19, series 7. Small spiculated nodule, anterior left upper lobe, image 34, series 7, 7 x 4 mm, mean 5.5 mm, increased in size from the prior CT. 5 mm nodule, superior segment of the left lower lobe, image 28, series 7, abuts the oblique fissure. There is adjacent nodular pleural thickening of the upper aspect of the left oblique fissure. This is similar to the prior CT. Advanced centrilobular emphysema. Stable air cyst, right lower lobe. No lung consolidation or evidence of edema. No pleural effusion or pneumothorax. Musculoskeletal: No fracture or acute finding. No bone lesion. No chest wall mass. Review of the MIP images confirms the above findings. CT ABDOMEN and PELVIS FINDINGS Hepatobiliary: Liver normal in size and overall attenuation. There are numerous small hypoattenuating masses, largest at the  dome of the right lobe, 1.3 cm. Findings are consistent with metastatic disease. Gallbladder is unremarkable. No bile duct dilation. Pancreas: Pancreatic atrophy and irregular duct dilation. Both findings have progressed since the prior CT. Duct with a maximum diameter of 8 mm. Calcifications lie adjacent to the distal pancreatic and common bile duct in the pancreatic head new since the prior CT. No defined pancreatic mass. There is ill-defined soft tissue adjacent to the common bile duct along the porta hepatis. No evidence of pancreatic inflammation. Spleen: Normal in size without focal abnormality. Adrenals/Urinary Tract: No adrenal mass. Kidneys normal in overall size and position with symmetric enhancement. There are wedge-shaped areas of hypoattenuation, anterolateral left kidney, mid to lower pole and posterior upper pole the right kidney, new since the prior CT, consistent with infarcts. No defined renal mass. No hydronephrosis. Ureters are grossly normal in course and in caliber. Bladder is unremarkable. Stomach/Bowel: Stomach is mostly decompressed, otherwise unremarkable. Grossly normal appearance of the small bowel. Right colon is mildly distended up to 5 cm. There is no wall thickening or inflammation. Vascular/Lymphatic: Prominent and mildly enlarged shoddy lymph nodes in the upper abdomen, gastrohepatic ligament, Peri celiac and aortocaval. Gastrohepatic ligament node measures largest, 1.4 cm. Nodes have increased when compared to the prior CT. Dense aortoiliac atherosclerotic calcification. No aneurysm. Reproductive: Mild stable prostate enlargement. Other: No abdominal wall hernia.  No ascites. Musculoskeletal: No fracture or acute finding. No osteoblastic or osteolytic lesions. Review of the MIP images confirms the above findings. IMPRESSION: CTA CHEST 1. No evidence of a pulmonary embolism. 2. No acute findings. 3. 2 left upper lobe pulmonary nodules, both spiculated appearance and suspicious,  largest abutting the anterior pleural surface measuring 1.4 cm in greatest dimension. Both nodules have increased in size from the previous CT and are concerning for primary lung carcinoma. 4. Advanced emphysema.  Aortic atherosclerosis. CT ABDOMEN AND PELVIS 1. No acute findings. 2. Multiple  low-attenuation liver masses consistent with metastatic disease, new from the prior CT. There are also enlarged lymph nodes in the upper abdomen, largest along the gastrohepatic ligament, consistent with metastatic disease. History provided for the current head CT reports pancreatic cancer. 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 3. No defined pancreatic mass, although there is irregular dilation of the pancreatic duct that has increased when compared to the prior CT. 4. Dense aortic atherosclerosis. 5. Wedge-shaped hypoattenuating areas in both kidneys, new since the prior CT, suspected to be infarcts. Electronically Signed   By: Alm Parkins M.D.   On: 01/09/2024 14:53   CT Head Wo Contrast Result Date: 01/09/2024 CLINICAL DATA:  Mental status change. Multiple seizures today. History of pancreatic carcinoma. EXAM: CT HEAD WITHOUT CONTRAST TECHNIQUE: Contiguous axial images were obtained from the base of the skull through the vertex without intravenous contrast. RADIATION DOSE REDUCTION: This exam was performed according to the departmental dose-optimization program which includes automated exposure control, adjustment of the mA and/or kV according to patient size and/or use of iterative reconstruction technique. COMPARISON:  12/26/2020. FINDINGS: Brain: No evidence of acute infarction, hemorrhage, hydrocephalus, extra-axial collection or mass lesion/mass effect. Vascular: No hyperdense vessel or unexpected calcification. Skull: Normal. Negative for fracture or focal lesion. Sinuses/Orbits: Globes and orbits are unremarkable. Visualized sinuses are clear. Other: None. IMPRESSION: No acute intracranial  abnormalities. Electronically Signed   By: Alm Parkins M.D.   On: 01/09/2024 14:27    Vitals:   01/09/24 2139 01/10/24 0510 01/10/24 0754 01/10/24 0905  BP: (!) 151/82 (!) 148/82 (!) 146/87   Pulse: 71 77 77   Resp: 16 17 16    Temp: 97.9 F (36.6 C) 97.7 F (36.5 C) (!) 97.5 F (36.4 C)   TempSrc: Oral Oral    SpO2: 96% 98% 97% 98%  Weight:      Height:         PHYSICAL EXAM General: Ill-appearing patient in no acute distress CV: Regular rate and rhythm on monitor Respiratory:  Regular, unlabored respirations on room air   NEURO:  Mental Status: AA&Ox3, patient is able to give clear and coherent history Speech/Language: speech is without dysarthria or aphasia.  Naming, repetition, fluency, and comprehension intact.  Cranial Nerves:  II: PERRL. Visual fields full.  III, IV, VI: EOMI. Eyelids elevate symmetrically.  V: Sensation is intact to light touch and symmetrical to face.  VII: Face is symmetrical resting and smiling VIII: hearing intact to voice. IX, X: Palate elevates symmetrically. Phonation is normal.  KP:Dynloizm shrug 5/5. XII: tongue is midline without fasciculations. Motor: Generalized weakness, no focal deficits. Tone: is normal and bulk is normal Sensation- Intact to light touch bilaterally. Extinction absent to light touch to DSS.   Coordination: FTN slow but intact bilaterally Gait- deferred  Most Recent NIH: 0.     ASSESSMENT/PLAN  Mr. Andrey Mccaskill is a 73 y.o. male with  hx of COPD, tobacco use, alcohol use, lung nodule, seizures on zonisamide  who presents with generalized weakness, cough, shortness of breath and confusion.  Reportedly had 104 fever at home, has been very tired and somnolent with nausea and vomiting and 2 seizures at home. NIH on Admission 0. WBC of 15.  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 He is positive for COVID. He had a routine EEG which did not demonstrate any seizures. He  had MRI of the brain which demonstrates bilateral embolic appearing infarcts multiple vascular distributions. Neurology was  consulted further evaluation workup.  Stroke:  embolic shower with bilateral anterior and posterior circulation, etiology likely due to hypercoagulable state from advanced malignancy  CT head CTH was negative for a large hypodensity concerning for a large territory infarct or hyperdensity concerning for an ICH  CTA head & neck: Right ICA occlusion beginning at bulb which does appear gradual in acute, small volume adherent thrombus within the lumen of right ICA origin. Right MCA and ACA origins are reconstituted with mild irregularity. Patent but diminutive posterior circulation appears to be on basis of fetal type PCA.   MRI Numerous small Embolic appearing acute infarcts are widely scattered in the bilateral anterior and posterior circulation.  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 Additionally there is  mild to moderate diffuse encephalopathy. No seizures or epileptiform discharges were seen throughout the recording Increased dose fo home zonisamide  100mg  in AM and 200mg  in PM.   Hypertension Home meds:  none Stable BP Goal <180 while on Heparin  gtt.   Hyperlipidemia Home meds:  none LDL 92, goal < 70 Started on Lipitor 40mg  Continue on discharge  Tobacco Abuse Patient is currently on Nicotine  patches at home       Ready to quit? Yes Nicotine  replacement therapy continued  Other Stroke Risk Factors Advanced age ETOH use, alcohol level <15, advised to stop drinking  Other Active  Problems COPD history of etoh abuse, alcoholic pancreatitis.  ?Metastatic Pancreatic Cancer versus Lung Cancer Pt cachectic  CTA Chest:  2 left upper lobe pulmonary nodules, both spiculated appearance and suspicious, largest abutting the anterior pleural surface measuring 1.4 cm in greatest dimension. Both nodules have increased in size from the previous CT and are concerning for primary lung carcinoma. CTA Abdomen: Multiple low-attenuation liver masses consistent with metastatic disease, new from the prior CT. There are also enlarged lymph nodes in the upper abdomen, largest along the gastrohepatic ligament, consistent with metastatic disease. History provided for the current head CT reports pancreatic cancer. 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 Findings were not reported on CTA Abdomen 12/29/2022 Pending IR Liver Biopsy  Hospital day # 1   Pt seen by Neuro NP/APP with MD. Note/plan to be edited by MD as needed.    Rocky JAYSON Likes, DNP, AGACNP-BC [facility] Please use AMION for contact information & EPIC for messaging.  ATTENDING NOTE: I reviewed above note and agree with the assessment and plan. Pt was seen and examined.   RN is at the bedside. Pt is cachetic appearance, but neuro intact except mild dysarthria. MRI showed embolic stroke likely due to advanced malignancy, origin unclear, pancreatic cancer vs. Lung cancer. IR consulted for liver biopsy. Now on heparin  IV and statin. PT and OT pending, will follow.   For detailed assessment and plan, please refer to above as I have made changes wherever appropriate.   Ary Cummins, MD PhD Stroke Neurology 01/10/2024 11:54 PM  I discussed with Dr. Davia. I spent additional inpatient face-to-face time with the patient and family, reviewing test results, images and medication, and discussing the diagnosis, treatment plan and potential prognosis. This patient's care requiresreview of multiple databases,  neurological assessment, discussion with  other specialists and medical decision making of high complexity.       To contact Stroke Continuity provider, please refer to [email or web address]. After hours, contact General Neurology

## 2024-01-10 NOTE — Plan of Care (Signed)
   Problem: Education: Goal: Knowledge of General Education information will improve Description: Including pain rating scale, medication(s)/side effects and non-pharmacologic comfort measures Outcome: Progressing   Problem: Activity: Goal: Risk for activity intolerance will decrease Outcome: Progressing

## 2024-01-10 NOTE — Progress Notes (Signed)
 Called report for patient transfer to 3W.

## 2024-01-11 ENCOUNTER — Inpatient Hospital Stay (HOSPITAL_COMMUNITY)

## 2024-01-11 DIAGNOSIS — I6340 Cerebral infarction due to embolism of unspecified cerebral artery: Secondary | ICD-10-CM | POA: Diagnosis not present

## 2024-01-11 DIAGNOSIS — E785 Hyperlipidemia, unspecified: Secondary | ICD-10-CM

## 2024-01-11 DIAGNOSIS — R29700 NIHSS score 0: Secondary | ICD-10-CM | POA: Diagnosis not present

## 2024-01-11 DIAGNOSIS — I639 Cerebral infarction, unspecified: Secondary | ICD-10-CM

## 2024-01-11 DIAGNOSIS — I6521 Occlusion and stenosis of right carotid artery: Secondary | ICD-10-CM

## 2024-01-11 DIAGNOSIS — F17200 Nicotine dependence, unspecified, uncomplicated: Secondary | ICD-10-CM

## 2024-01-11 DIAGNOSIS — D6869 Other thrombophilia: Secondary | ICD-10-CM

## 2024-01-11 DIAGNOSIS — R531 Weakness: Secondary | ICD-10-CM | POA: Diagnosis not present

## 2024-01-11 DIAGNOSIS — C801 Malignant (primary) neoplasm, unspecified: Secondary | ICD-10-CM

## 2024-01-11 DIAGNOSIS — E43 Unspecified severe protein-calorie malnutrition: Secondary | ICD-10-CM | POA: Insufficient documentation

## 2024-01-11 DIAGNOSIS — R5383 Other fatigue: Secondary | ICD-10-CM | POA: Diagnosis not present

## 2024-01-11 DIAGNOSIS — U071 COVID-19: Secondary | ICD-10-CM | POA: Diagnosis not present

## 2024-01-11 DIAGNOSIS — E860 Dehydration: Secondary | ICD-10-CM | POA: Diagnosis not present

## 2024-01-11 DIAGNOSIS — I6389 Other cerebral infarction: Secondary | ICD-10-CM | POA: Diagnosis not present

## 2024-01-11 DIAGNOSIS — R55 Syncope and collapse: Secondary | ICD-10-CM

## 2024-01-11 LAB — ECHOCARDIOGRAM COMPLETE
AR max vel: 2.52 cm2
AV Area VTI: 2.45 cm2
AV Area mean vel: 2.43 cm2
AV Mean grad: 2 mmHg
AV Peak grad: 3.7 mmHg
Ao pk vel: 0.96 m/s
Area-P 1/2: 4.63 cm2
Height: 66 in
MV M vel: 3.53 m/s
MV Peak grad: 49.8 mmHg
S' Lateral: 2.6 cm
Weight: 1693.13 [oz_av]

## 2024-01-11 LAB — HEPARIN LEVEL (UNFRACTIONATED)
Heparin Unfractionated: 0.29 [IU]/mL — ABNORMAL LOW (ref 0.30–0.70)
Heparin Unfractionated: 0.36 [IU]/mL (ref 0.30–0.70)
Heparin Unfractionated: 0.34 [IU]/mL (ref 0.30–0.70)

## 2024-01-11 LAB — CBC
HCT: 38 % — ABNORMAL LOW (ref 39.0–52.0)
Hemoglobin: 12.4 g/dL — ABNORMAL LOW (ref 13.0–17.0)
MCH: 29.1 pg (ref 26.0–34.0)
MCHC: 32.6 g/dL (ref 30.0–36.0)
MCV: 89.2 fL (ref 80.0–100.0)
Platelets: 94 K/uL — ABNORMAL LOW (ref 150–400)
RBC: 4.26 MIL/uL (ref 4.22–5.81)
RDW: 13.2 % (ref 11.5–15.5)
WBC: 10.5 K/uL (ref 4.0–10.5)
nRBC: 0 % (ref 0.0–0.2)

## 2024-01-11 LAB — COMPREHENSIVE METABOLIC PANEL WITH GFR
ALT: 24 U/L (ref 0–44)
AST: 46 U/L — ABNORMAL HIGH (ref 15–41)
Albumin: 2.8 g/dL — ABNORMAL LOW (ref 3.5–5.0)
Alkaline Phosphatase: 145 U/L — ABNORMAL HIGH (ref 38–126)
Anion gap: 13 (ref 5–15)
BUN: 8 mg/dL (ref 8–23)
CO2: 23 mmol/L (ref 22–32)
Calcium: 9.1 mg/dL (ref 8.9–10.3)
Chloride: 101 mmol/L (ref 98–111)
Creatinine, Ser: 0.83 mg/dL (ref 0.61–1.24)
GFR, Estimated: >60 mL/min (ref >60)
Glucose, Bld: 70 mg/dL (ref 70–99)
Potassium: 3.6 mmol/L (ref 3.5–5.1)
Sodium: 137 mmol/L (ref 135–145)
Total Bilirubin: 0.5 mg/dL (ref 0.0–1.2)
Total Protein: 6.5 g/dL (ref 6.5–8.1)

## 2024-01-11 LAB — PROTIME-INR
INR: 0.9 (ref 0.8–1.2)
Prothrombin Time: 13.1 s (ref 11.4–15.2)

## 2024-01-11 MED ORDER — SODIUM CHLORIDE 0.9 % IV SOLN: INTRAVENOUS | Status: DC

## 2024-01-11 NOTE — Evaluation (Addendum)
 Occupational Therapy Evaluation Patient Details Name: Lonnie Blair MRN: [id number] DOB: [birth year] Today's Date: 01/11/2024   History of Present Illness   Pt is a 73 y/o male presenting on 7/19 with L sided weakness. 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 EEG with cortical dysfunction from R hemisphere, postictal state. COVID +. CTA negative for PE but concern for lung CA and advanced emphysema, CT abd concern for metastatic disease. PMH includes: seizures, pancreatic cancer (?per family).     Clinical Impressions PTA pt independent and driving, working. Admitted for above and presenting with problem list below.  Patient mobilizing OOB with contact guard for safety into bathroom due to urgency, but did require cueing to locate bathroom on L side (he was heading towards main door). After toileting with contact guard, pt stopped responding and not following commands; required min assist +2 (NT) to exit bathroom. Once returned to EOB, pt verbalizing but demonstrating slowed processing and decreased problem solving. MD (Dr. Jerri) entered and reports likely orthostatic episode. Notified Dr. Davia and RN as well. BP taken as below. Will benefit from continued OT services acutely and after dc at outpatient OT services.  Do recommend 24/7 supervision, assist with meds, meals and driving at dc.   BP EOB 128/87 Standing 107/83 Stand x 1 min 121/77    If plan is discharge home, recommend the following:   A little help with walking and/or transfers;A lot of help with bathing/dressing/bathroom;Assistance with cooking/housework;Direct supervision/assist for medications management;Direct supervision/assist for financial management;Assist for transportation;Help with stairs or ramp for entrance;Supervision due to cognitive status     Functional Status Assessment   Patient has had a recent decline in their functional status and demonstrates the ability to  make significant improvements in function in a reasonable and predictable amount of time.     Equipment Recommendations   BSC/3in1     Recommendations for Other Services         Precautions/Restrictions   Precautions Precautions: Fall Restrictions Weight Bearing Restrictions Per Provider Order: No     Mobility Bed Mobility Overal bed mobility: Needs Assistance Bed Mobility: Supine to Sit, Sit to Supine     Supine to sit: Supervision Sit to supine: Min assist, +2 for safety/equipment   General bed mobility comments: supervision to EOB for lines, returns back to bed with support to guide LEs (pt leaving R LE at EOB when trying to bring up L LE)    Transfers Overall transfer level: Needs assistance   Transfers: Sit to/from Stand Sit to Stand: Contact guard assist           General transfer comment: from EOB      Balance Overall balance assessment: Needs assistance Sitting-balance support: No upper extremity supported, Feet supported Sitting balance-Leahy Scale: Fair     Standing balance support: Bilateral upper extremity supported, During functional activity Standing balance-Leahy Scale: Poor Standing balance comment: +2 min assist safety exiting bathroom                           ADL either performed or assessed with clinical judgement   ADL Overall ADL's : Needs assistance/impaired     Grooming: Set up;Sitting           Upper Body Dressing : Set up;Sitting       Toilet Transfer: Contact guard assist;Ambulation;Minimal assistance;+2 for physical assistance;+2 for safety/equipment  Functional mobility during ADLs: +2 for safety/equipment;Minimal assistance;+2 for physical assistance;Contact guard assist General ADL Comments: patient ambulated to bathroom with min guard for safety, after urinating pt stumbled to R and held tightly to bar on R side of toilet.  Stopped following commands with downward gaze.  NT present  and assisted to release R hand and required bil hand held assist to ambulate to bed.  Pt verbalizing and following commands once returned back to bed.  Orthostatics taken, see below.  RN and MD aware.     Vision   Vision Assessment?: No apparent visual deficits     Perception         Praxis         Pertinent Vitals/Pain Pain Assessment Pain Assessment: Faces Faces Pain Scale: No hurt Pain Intervention(s): Monitored during session     Extremity/Trunk Assessment Upper Extremity Assessment Upper Extremity Assessment: LUE deficits/detail LUE Deficits / Details: grossly 4-/5 MMT (compared to 4+/5 R side) LUE Coordination: WNL   Lower Extremity Assessment Lower Extremity Assessment: Defer to PT evaluation RLE Deficits / Details: Grossly 4+/5 LLE Deficits / Details: Grossly 4-/5   Cervical / Trunk Assessment Cervical / Trunk Assessment: Normal   Communication Communication Communication: No apparent difficulties   Cognition Arousal: Alert Behavior During Therapy: Flat affect, Impulsive Cognition: Cognition impaired     Awareness: Intellectual awareness impaired, Online awareness impaired Memory impairment (select all impairments): Short-term memory, Working memory Attention impairment (select first level of impairment): Focused attention Executive functioning impairment (select all impairments): Organization, Reasoning, Problem solving OT - Cognition Comments: pt oriented and following simple commands, he does demonstrate some impulsiveness standing mulitple times at EOB initally.  Once mobilized to bathroom, pt with poor awareness and organization/problem solving as had to have assistance to locate bathroom.  Not responding in bathroom briefly or following commands. Once able to safely exit bathroom, pt with slowed processing and decreased problem solving and reports feeling like he was going to pass out                 Following commands: Impaired Following  commands impaired: Follows one step commands with increased time, Follows multi-step commands inconsistently     Cueing  General Comments   Cueing Techniques: Verbal cues  Spoke to Dr. Jerri and Dr. Davia about events during session   Exercises     Shoulder Instructions      Home Living Family/patient expects to be discharged to:: Private residence Living Arrangements: Spouse/significant other Available Help at Discharge: Family Type of Home: House Home Access: Stairs to enter [employer] of Steps: 4 Entrance Stairs-Rails: Right;Left;Can reach both Home Layout: One level     Bathroom Shower/Tub: Walk-in shower;Tub only   Bathroom Toilet: Handicapped height     Home Equipment: Grab bars - tub/shower;Grab bars - toilet;Shower seat - built in          Prior Functioning/Environment Prior Level of Function : Independent/Modified Independent;Working/employed;Driving             Mobility Comments: Owns lawn business ADLs Comments: indepedent, working and driving    OT Problem List: Decreased strength;Decreased activity tolerance;Impaired balance (sitting and/or standing);Decreased knowledge of precautions;Decreased knowledge of use of DME or AE;Decreased safety awareness;Decreased cognition;Decreased coordination   OT Treatment/Interventions: Self-care/ADL training;Therapeutic exercise;DME and/or AE instruction;Therapeutic activities;Cognitive remediation/compensation;Patient/family education;Balance training      OT Goals(Current goals can be found in the care plan section)   Acute Rehab OT Goals Patient Stated Goal: none stated Time For  Goal Achievement: 01/25/24 Potential to Achieve Goals: Good   OT Frequency:  Min 2X/week    Co-evaluation              AM-PAC OT 6 Clicks Daily Activity     Outcome Measure Help from another person eating meals?: Total (NPO) Help from another person taking care of personal grooming?: A Little Help from  another person toileting, which includes using toliet, bedpan, or urinal?: A Lot Help from another person bathing (including washing, rinsing, drying)?: A Lot Help from another person to put on and taking off regular upper body clothing?: A Little Help from another person to put on and taking off regular lower body clothing?: A Lot 6 Click Score: 13   End of Session Nurse Communication: Mobility status  Activity Tolerance: Treatment limited secondary to medical complications (Comment) Patient left: in bed;with call bell/phone within reach;with bed alarm set  OT Visit Diagnosis: Other abnormalities of gait and mobility (R26.89);Muscle weakness (generalized) (M62.81);Other symptoms and signs involving cognitive function                Time: 1131-1203 OT Time Calculation (min): 32 min Charges:  OT General Charges $OT Visit: 1 Visit OT Evaluation $OT Eval Moderate Complexity: 1 Mod OT Treatments $Self Care/Home Management : 8-22 mins  Etta NOVAK, OT Acute Rehabilitation Services Office [phone] Secure Chat Preferred    Etta GORMAN Hope 01/11/2024, 1:40 PM

## 2024-01-11 NOTE — Plan of Care (Signed)
  Problem: Education: Goal: Knowledge of risk factors and measures for prevention of condition will improve Outcome: Progressing   Problem: Coping: Goal: Psychosocial and spiritual needs will be supported Outcome: Progressing   Problem: Respiratory: Goal: Will maintain a patent airway Outcome: Progressing Goal: Complications related to the disease process, condition or treatment will be avoided or minimized Outcome: Progressing   Problem: Education: Goal: Knowledge of General Education information will improve Description: Including pain rating scale, medication(s)/side effects and non-pharmacologic comfort measures Outcome: Progressing   Problem: Health Behavior/Discharge Planning: Goal: Ability to manage health-related needs will improve Outcome: Progressing   Problem: Clinical Measurements: Goal: Ability to maintain clinical measurements within normal limits will improve Outcome: Progressing Goal: Will remain free from infection Outcome: Progressing Goal: Diagnostic test results will improve Outcome: Progressing Goal: Respiratory complications will improve Outcome: Progressing Goal: Cardiovascular complication will be avoided Outcome: Progressing   Problem: Activity: Goal: Risk for activity intolerance will decrease Outcome: Progressing   Problem: Nutrition: Goal: Adequate nutrition will be maintained Outcome: Progressing   Problem: Coping: Goal: Level of anxiety will decrease Outcome: Progressing   Problem: Elimination: Goal: Will not experience complications related to bowel motility Outcome: Progressing Goal: Will not experience complications related to urinary retention Outcome: Progressing   Problem: Pain Managment: Goal: General experience of comfort will improve and/or be controlled Outcome: Progressing   Problem: Safety: Goal: Ability to remain free from injury will improve Outcome: Progressing   Problem: Skin Integrity: Goal: Risk for impaired  skin integrity will decrease Outcome: Progressing   Problem: Education: Goal: Knowledge of disease or condition will improve Outcome: Progressing Goal: Knowledge of secondary prevention will improve (MUST DOCUMENT ALL) Outcome: Progressing Goal: Knowledge of patient specific risk factors will improve (DELETE if not current risk factor) Outcome: Progressing   Problem: Ischemic Stroke/TIA Tissue Perfusion: Goal: Complications of ischemic stroke/TIA will be minimized Outcome: Progressing   Problem: Coping: Goal: Will verbalize positive feelings about self Outcome: Progressing Goal: Will identify appropriate support needs Outcome: Progressing   Problem: Health Behavior/Discharge Planning: Goal: Ability to manage health-related needs will improve Outcome: Progressing Goal: Goals will be collaboratively established with patient/family Outcome: Progressing   Problem: Self-Care: Goal: Ability to participate in self-care as condition permits will improve Outcome: Progressing Goal: Verbalization of feelings and concerns over difficulty with self-care will improve Outcome: Progressing Goal: Ability to communicate needs accurately will improve Outcome: Progressing   Problem: Nutrition: Goal: Risk of aspiration will decrease Outcome: Progressing Goal: Dietary intake will improve Outcome: Progressing

## 2024-01-11 NOTE — Progress Notes (Signed)
 Initial Nutrition Assessment  DOCUMENTATION CODES:   Severe malnutrition in context of chronic illness, Underweight (decreased appetite d/t medications, suspected metastatic cancer)  INTERVENTION:  When diet advanced from NPO, recommend: Liberalized soft diet (per SLP) with no restrictions Ensure Plus High Protein po BID, each supplement provides 350 kcal and 20 grams of protein.  Continued thiamine  100mg , folic acid , and MVI w/ minerals supplementation  Added High Calorie, High Protein Nutrition Therapy handout to AVS   NUTRITION DIAGNOSIS:   Severe Malnutrition related to chronic illness (decreased appetite d/t medications, suspected metastatic cancer) as evidenced by energy intake < or equal to 75% for > or equal to 1 month, severe muscle depletion, severe fat depletion.  GOAL:   Patient will meet greater than or equal to 90% of their needs  MONITOR:   PO intake, Supplement acceptance  REASON FOR ASSESSMENT:   Malnutrition Screening Tool    ASSESSMENT:   Pt with hx seizures, pancreatitis, COPD, and tobacco/alcohol use. Pt admitted with left sided weakness following 2 witnessed seizures at home, undergoing stroke workup, pt positive for COVID.  CT scan showed 2 left upper nodules suspicious for malignancy and primary lung carcinoma, oncology following. Biopsy of liver lesions pending, suspect metastasis. Pt disclosed previous pancreatic cancer diagnosis last year but is not charted in pt's chart and pt has never been followed by oncologist. Pt in CIWA protocol. SLP recommended soft diet due to pt coughing during eval but will re-evaluate when COVID symptoms improve.   Spoke with pt who was awake and alert. Pt reports he is hungry, discussed current NPO status. Pt reports no current GI discomforts. Encouraged adequate intake of meals while admitted once pt is ordered a diet. Pt reports no swallowing or chewing issues.  PTA, pt reports eating 2 meals per day with 1-2 snacks in  between. Morning meal consisted of sausage or [place] and eggs from a [employer]. Snacks consisted of peanut butter crackers and little debbie cakes. Dinner typically consisted of rice and chicken. Pt reports drinking Gatorade consistently throughout the day. Discussed high calorie, high protein nutrition therapy with pt due to increased needs with suspected cancer. Encouraged small frequent meals per day and ONS intake. Discussed tips for adding calories to meals with butter, milk, and sauces. Pt expressed understanding.  Pt reports significant weight loss over last two years of 50+ pounds. Pt reports he weighed 158# on average prior to last two years and now weighs 105#. There is no wt hx in chart, unable to confirm at this time, but nutrition focused physical exam shows significant fat and muscle depletions. Pt reports his PCP suspected weight loss was due to decreased appetite when he began medication for seizures a few years ago. Suspect multiple factors contributing to significant wt loss including possible metastatic cancer and previous reported cancer diagnosis in combination with decreased intake/appetite.  PTA, pt was independent and mobile. 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   Average Meal Completion: 41% average intake over 5 recorded meals  Medications reviewed and include:  Colace Folic acid  MVI w/ minerals Senna Thiamine  100mg  daily Unasyn   Labs reviewed   NUTRITION - FOCUSED PHYSICAL EXAM:  Flowsheet Row Most Recent Value  Orbital Region Severe depletion  Upper Arm Region Severe depletion  Thoracic and Lumbar Region Severe depletion  Buccal Region Severe depletion  Temple Region Severe depletion  Clavicle Bone Region Severe depletion  Clavicle and Acromion Bone Region  Severe depletion  Scapular Bone Region Severe depletion  Dorsal Hand Severe depletion  Patellar Region Severe depletion  Anterior  Thigh Region Severe depletion  Posterior Calf Region Severe depletion  Edema (RD Assessment) None  Hair Reviewed  Eyes Reviewed  Mouth Reviewed  Skin Reviewed  Nails Reviewed    Diet Order:   Diet Order             Diet NPO time specified Except for: Sips with Meds  Diet effective midnight                   EDUCATION NEEDS:   Education needs have been addressed  Skin:  Skin Assessment: Reviewed RN Assessment  Last BM:  7/20  Height:   Ht Readings from Last 1 Encounters:  01/09/24 5' 6 (1.676 m)    Weight:   Wt Readings from Last 1 Encounters:  01/09/24 48 kg    Ideal Body Weight:  64.5 kg  BMI:  Body mass index is 17.08 kg/m.  Estimated Nutritional Needs:   Kcal:  1800-2000  Protein:  70-90g  Fluid:  1.8-2L   Lonnie Glance, MS, RDN, LDN Clinical Dietitian I Please reach out via secure chat

## 2024-01-11 NOTE — Progress Notes (Signed)
*  PRELIMINARY RESULTS* Echocardiogram 2D Echocardiogram has been performed.  Lonnie Blair 01/11/2024, 11:06 AM

## 2024-01-11 NOTE — Plan of Care (Signed)
  Problem: Pain Managment: Goal: General experience of comfort will improve and/or be controlled Outcome: Progressing   Problem: Safety: Goal: Ability to remain free from injury will improve Outcome: Progressing   Problem: Skin Integrity: Goal: Risk for impaired skin integrity will decrease Outcome: Progressing

## 2024-01-11 NOTE — Progress Notes (Signed)
 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   Allergies  Allergen Reactions   Tylenol  [Acetaminophen ] Itching    Patient Measurements: Height: 5' 6 (167.6 cm) Weight: 48 kg (105 lb 13.1 oz) IBW/kg (Calculated) : 63.8 HEPARIN  DW (KG): 48  Vital Signs: Temp: 97.8 F (36.6 C) (07/21 1553) Temp Source: Oral (07/21 1553) BP: 129/83 (07/21 1553) Pulse Rate: 83 (07/21 1553)  Labs: Recent Labs    01/09/24 1157 01/09/24 1901 01/10/24 0748 01/10/24 1648 01/11/24 0228 01/11/24 0451 01/11/24 1126 01/11/24 1838  HGB 11.5* 11.9* 12.1*  --   --  12.4*  --   --   HCT 35.4* 36.6* 37.5*  --   --  38.0*  --   --   PLT 121* 92* 91*  --   --  94*  --   --   LABPROT 13.8  --   --   --   --  13.1  --   --   INR 1.0  --   --   --   --  0.9  --   --   HEPARINUNFRC  --   --   --    < > 0.29*  --  0.36 0.34  CREATININE 1.03 0.88 0.80  --   --  0.83  --   --   CKTOTAL 94  --   --   --   --   --   --   --    < > = values in this interval not displayed.    Estimated Creatinine Clearance: 53.8 mL/min (by C-G formula based on SCr of 0.83 mg/dL).   Medical History: Past Medical History:  Diagnosis Date   Seizures (HCC)     Medications:  Medications Prior to Admission  Medication Sig Dispense Refill Last Dose/Taking   albuterol  (VENTOLIN  HFA) 108 (90 Base) MCG/ACT inhaler Inhale 1-2 puffs into the lungs every 6 (six) hours as needed for wheezing or shortness of breath.   Unknown   ipratropium-albuterol  (DUONEB) 0.5-2.5 (3) MG/3ML SOLN Take 3 mLs by nebulization every 6 (six) hours as needed.   01/09/2024   lactose free nutrition (BOOST PLUS) LIQD Take 237 mLs by mouth once a week. Drink 237 ml's of Strawberry Boost Plus   Past Week   zonisamide  (ZONEGRAN ) 100 MG capsule Take 100 mg by mouth in the morning and at bedtime.   01/09/2024 Morning   NICOTINE  TD Place 1 patch onto the skin daily.  (Patient not taking: Reported on 01/09/2024)   Not Taking   Scheduled:   arformoterol   15 mcg Nebulization BID   atorvastatin   40 mg Oral Daily   budesonide  (PULMICORT ) nebulizer solution  0.25 mg Nebulization BID   docusate sodium   100 mg Oral BID   folic acid   1 mg Oral Daily   multivitamin with minerals  1 tablet Oral Daily   nicotine   21 mg Transdermal Daily   senna-docusate  1 tablet Oral BID   thiamine   100 mg Oral Daily   Or   thiamine   100 mg Intravenous Daily   zonisamide   100 mg Oral q AM   And   zonisamide   200 mg Oral QHS   Infusions:   sodium chloride  75 mL/hr at 01/11/24 1410   ampicillin -sulbactam (UNASYN ) IV 3 g (01/11/24 1749)   heparin  750 Units/hr (01/11/24 1751)    Assessment: Patient is a 73 year old man who presented  to the ED with 1 week of generalized weakness, shortness of breath, coughing, and confusion. Patient does have a history of seizure disorders. The patient had 2 seizures prior to admission and reported acute left-sided weakness to EMS. No prior history of stroke. MRI revealed multiple embolic infarcts, meaning patient is likely hypercoagulable in the setting of cancer.   7/21 AM update: HL 0.36 Hgb 12.4 PLT 94K No signs of bleeding or pauses in gtt  7/21 PM update: HL 0.34 is therapeutic on heparin  750 units/hr. No issues with the heparin  infusion or bleeding documented.  Goal of Therapy:  Heparin  level 0.3-0.5 units/ml Monitor platelets by anticoagulation protocol: Yes   Plan:  Continue heparin  infusion at 750 units/hr Check daily heparin  level Continue to monitor H&H and platelets. Monitor for any signs or symptoms of bleeding. Follow up transition to oral anticoagulation.  Thank you for allowing pharmacy to be a part of this patient's care.  Rocky Slade, PharmD, BCPS Clinical Pharmacist 01/11/2024 7:37 PM  Contact: [phone] after 3 PM

## 2024-01-11 NOTE — Progress Notes (Signed)
 [facility]                                                                              Lonnie Blair, is a 73 y.o. male, DOB - Jan 07, 1951, FMW:[id number] Admit date - 01/09/2024    Outpatient Primary MD for the patient is [name], FNP  LOS - 2  days  Chief Complaint  Patient presents with   Neurologic Problem       Brief summary   Patient is a 73 year old male with history of seizures, sonogram, negative findings, alcohol use, previous pancreatitis and COPD presented with 1 week of generalized weakness, shortness of breath, coughing, confusion.  Per family he was out of it with this illness, very weak, had 2 seizures witnessed by his family.  He also reported nausea vomiting and abdominal pain, last BM on Thursday, 3 days PTA.  Family also reported a fever of 104 F 2 days prior to admission, very tired and somnolent.  Per wife, they called EMS today and was noted to have acute left-sided weakness.  No prior history of stroke. Per family, they have had a viral illness in the last week.   Smokes half pack per day, drinks 2 beers every other day, ambulates at baseline without any difficulty. Family also reported previous mention of pancreatic carcinoma last year  however have not seen oncology  CTA chest showed no PE, pulm left upper lobe spiculated pulmonary nodules, concerning for primary lung CA, advanced emphysema CT abdomen pelvis showed multiple liver masses consistent with metastatic disease, new.  No defined pancreatic mass although there is irregular dilation of the pancreatic head increased, wedge-shaped hypoattenuating areas in both kidneys, new.    Assessment & Plan     Acute left-sided weakness/multiple embolic CVAs - Per family, patient had left-sided weakness noted this morning, had 2 seizures prior to admission. New findings of metastatic disease on CT chest abdomen pelvis. - MRI brain showed numerous small embolic appearing acute  infarcts scattered in the bilateral anterior and posterior circulation, no hemorrhagic transfer effect.  Right ICA poor flow or occlusion in the neck with evidence of reconstituted right ICA terminus.  No metastatic disease. - Seen by neurology, started on IV heparin  drip  - CTA head and neck showed right ICA occlusion beginning at the valve which she does appear gradual and acute with a small volume adherent thrombus in the lumen of the right ICA origin, right MCA and ACA origins are reconstituted, numerous small embolic appearing infarcts - Follow 2D echo - Lipid panel showed LDL 92,  goal <70, on Lipitor 40 mg daily - Hemoglobin A1c 5.7 - On IV heparin  drip, stroke team will follow  Seizures, breakthrough -  received 1 dose of Keppra  IV in ED -EEG showed cortical dysfunction from the right hemisphere likely secondary to underlying structural abnormality, postictal state, mild to moderate diffuse encephalopathy, no seizures or epileptiform discharges -Increased zonisamide  to 100 mg a.m., 200 mg p.m.     Metastatic disease with lung mass, pancreatic mass, liver lesions -Per patient's family, there has been mention of pancreatic cancer in the past however they have not  seen oncology.  - Appreciate pulmonology evaluation - Plan for liver biopsy today.  Oncology consulted, Dr. Sherrod   COVID 19 viral infection, COPD exacerbation - Ongoing symptoms for last 1 week, not be a candidate for antiviral now.   - Continue supportive treatment, IV fluids, DuoNebs, Pulmicort , Brovana   - Continue IV Unasyn  - Blood cultures NTD     Nicotine  abuse - Counseled on smoking cessation, placed on nicotine  patch   Nausea vomiting, abdominal pain, constipation - CT abdomen did not show any SBO or ileus, however has metastatic disease with possibility of pancreatic CA - Supportive care with antiemetics, bowel regimen     Generalized debility - PT OT evaluation   History of alcohol use -Placed on CIWA  protocol with Ativan  -Thiamine , folate, MVI  Underweight, moderate protein calorie malnutrition, hypoalbuminemia Estimated body mass index is 17.08 kg/m as calculated from the following:   Height as of this encounter: 5' 6 (1.676 m).   Weight as of this encounter: 48 kg.  Code Status: Full code DVT Prophylaxis:  IV heparin  drip   Level of Care: Level of care: Progressive Family Communication: Updated patient's wife on 7/20 Disposition Plan:      Remains inpatient appropriate: Workup in progress   Procedures:    Consultants:   Neurology Pulmonology IR Oncology  Antimicrobials:   Anti-infectives (From admission, onward)    Start     Dose/Rate Route Frequency Ordered Stop   01/09/24 1700  Ampicillin -Sulbactam (UNASYN ) 3 g in sodium chloride  0.9 % 100 mL IVPB        3 g 200 mL/hr over 30 Minutes Intravenous Every 6 hours 01/09/24 1658            Medications  arformoterol   15 mcg Nebulization BID   atorvastatin   40 mg Oral Daily   budesonide  (PULMICORT ) nebulizer solution  0.25 mg Nebulization BID   docusate sodium   100 mg Oral BID   folic acid   1 mg Oral Daily   multivitamin with minerals  1 tablet Oral Daily   nicotine   21 mg Transdermal Daily   senna-docusate  1 tablet Oral BID   thiamine   100 mg Oral Daily   Or   thiamine   100 mg Intravenous Daily   zonisamide   100 mg Oral q AM   And   zonisamide   200 mg Oral QHS      Subjective:   [name] was seen and examined today.  No acute complaints.  No chest pain, shortness of breath, fever chills.  Overall feeling better Objective:   Vitals:   01/10/24 2316 01/11/24 0348 01/11/24 0758 01/11/24 0808  BP: (!) 156/89 (!) 142/76 (!) 145/79   Pulse: 97 77 79   Resp: 18 12 17    Temp: 98.6 F (37 C) 98.2 F (36.8 C) (!) 97.5 F (36.4 C)   TempSrc: Oral Oral Oral   SpO2: 97% 100% 100% 97%  Weight:      Height:        Intake/Output Summary (Last 24 hours) at 01/11/2024 1109 Last data filed at  01/10/2024 1656 Gross per 24 hour  Intake 600 ml  Output --  Net 600 ml     Wt Readings from Last 3 Encounters:  01/09/24 48 kg  12/29/22 48 kg    Physical Exam General: Alert and oriented x 3, NAD, cachectic, ill-appearing Cardiovascular: S1 S2 clear, RRR.  Respiratory: CTAB, no wheezing Gastrointestinal: Soft, nontender, nondistended, NBS Ext: no pedal edema bilaterally Neuro: no new  deficits Psych: Normal affect     Data Reviewed:  I have personally reviewed following labs    CBC Lab Results  Component Value Date   WBC 10.5 01/11/2024   RBC 4.26 01/11/2024   HGB 12.4 (L) 01/11/2024   HCT 38.0 (L) 01/11/2024   MCV 89.2 01/11/2024   MCH 29.1 01/11/2024   PLT 94 (L) 01/11/2024   MCHC 32.6 01/11/2024   RDW 13.2 01/11/2024   LYMPHSABS 2.1 01/09/2024   MONOABS 1.1 (H) 01/09/2024   EOSABS 0.4 01/09/2024   BASOSABS 0.0 01/09/2024     Last metabolic panel Lab Results  Component Value Date   NA 137 01/11/2024   K 3.6 01/11/2024   CL 101 01/11/2024   CO2 23 01/11/2024   BUN 8 01/11/2024   CREATININE 0.83 01/11/2024   GLUCOSE 70 01/11/2024   GFRNONAA >60 01/11/2024   GFRAA >60 01/04/2011   CALCIUM  9.1 01/11/2024   PROT 6.5 01/11/2024   ALBUMIN 2.8 (L) 01/11/2024   BILITOT 0.5 01/11/2024   ALKPHOS 145 (H) 01/11/2024   AST 46 (H) 01/11/2024   ALT 24 01/11/2024   ANIONGAP 13 01/11/2024    CBG (last 3)  No results for input(s): GLUCAP in the last 72 hours.    Coagulation Profile: Recent Labs  Lab 01/09/24 1157 01/11/24 0451  INR 1.0 0.9     Radiology Studies: I have personally reviewed the imaging studies  CT ANGIO HEAD NECK W WO CM Addendum Date: 01/10/2024 ADDENDUM REPORT: 01/10/2024 12:03 ADDENDUM: Study discussed by telephone with Dr. Jerri on 01/10/2024 at 1156 hours. Electronically Signed   By: VEAR Hurst M.D.   On: 01/10/2024 12:03   Result Date: 01/10/2024 CLINICAL DATA:  73 year old male with seizures. Metastatic pancreatic cancer. Numerous  small embolic appearing infarcts on brain MRI this morning, and evidence of right ICA poor flow or occlusion on that exam. EXAM: CT ANGIOGRAPHY HEAD AND NECK WITH AND WITHOUT CONTRAST TECHNIQUE: Multidetector CT imaging of the head and neck was performed using the standard protocol during bolus administration of intravenous contrast. Multiplanar CT image reconstructions and MIPs were obtained to evaluate the vascular anatomy. Carotid stenosis measurements (when applicable) are obtained utilizing NASCET criteria, using the distal internal carotid diameter as the denominator. RADIATION DOSE REDUCTION: This exam was performed according to the departmental dose-optimization program which includes automated exposure control, adjustment of the mA and/or kV according to patient size and/or use of iterative reconstruction technique. CONTRAST:  75mL OMNIPAQUE  IOHEXOL  350 MG/ML SOLN COMPARISON:  Brain MRI 0413 hours today.  Head CT yesterday. CTA chest yesterday. FINDINGS: CT HEAD Brain: Numerous small scattered acute infarcts are mostly occult by CT. No acute intracranial hemorrhage identified. No midline shift, mass effect, or evidence of intracranial mass lesion. No ventriculomegaly. Calvarium and skull base: Intact. No acute or suspicious osseous lesion. Paranasal sinuses: Visualized paranasal sinuses and mastoids are stable and well aerated. Orbits: No acute orbit or scalp soft tissue finding. CTA NECK Skeleton: Carious dentition. No acute or suspicious osseous lesion identified in the neck. Partially visible mild thoracic scoliosis. Upper chest: Moderate to severe emphysema. Apical lung scarring. Spiculated anterior left upper lobe lung nodule measuring 7 mm on series 11, image 163. And a larger spiculated subpleural left upper lobe lung nodule on this exam is mostly obscured by dense left subclavian venous contrast streak artifact on series 11, image 48. See Chest CTA reported yesterday. Mediastinal lymph nodes within  normal limits. Visible central pulmonary arteries appear patent. Other neck:  Nonvascular neck soft tissue spaces are within normal limits. Aortic arch: Calcified aortic atherosclerosis.  3 vessel arch. Right carotid system: Patent brachiocephalic artery and right CCA with minimal plaque and no stenosis. Right ICA is gradually occluded beginning at the bulb, with evidence of stringy thrombus within the ICA origin on series 11, image 113. No reconstitution at the skull base. Left carotid system: Patent left CCA origin. No significant left CCA plaque or stenosis. Patent left carotid bifurcation with minor irregularity. Patent left ICA to the skull base without stenosis. Vertebral arteries: Right subclavian origin calcified plaque without stenosis. Normal right vertebral artery origin. Right vertebral artery is patent and normal to the skull base. Proximal left subclavian artery soft and calcified plaque without stenosis. Mild calcified plaque at the left vertebral artery origin without stenosis. Mildly non dominant left vertebral artery is patent and normal to the skull base. CTA HEAD Posterior circulation: Patent codominant distal vertebral arteries, somewhat diminutive along with the vertebrobasilar junction. But patent without plaque or stenosis identified. PICA origins are patent. Patent and tortuous basilar artery, diminutive but without stenosis. Patent SCA origins. Fetal type bilateral PCA origins. Left PCA branches are within normal limits. Right posterior communicating artery is hypoenhancing anteriorly near the junction with the abnormal right ICA terminus (series 16, image 39). But otherwise the right MCA branches are within normal limits. Anterior circulation: No reconstitution of the right ICA siphon which is atherosclerotic. Reconstituted right MCA and ACA origins with mild irregularity there on series 15, image 39. Anterior communicating artery is present and bilateral A1 enhancement is symmetric.  Bilateral ACA branches appear patent and within normal limits. Left ICA siphon is patent with calcified plaque, but no significant left siphon stenosis. Normal left posterior communicating artery origin. The on the right MCA origin irregularity the right M1 segment is enhancing symmetrically, within normal limits. Bilateral MCA M1 segments and MCA bifurcations appear patent without stenosis. No MCA branch occlusion is identified on either side. Venous sinuses: Early contrast timing, not well evaluated. Anatomic variants: Fetal type bilateral PCA origins. Review of the MIP images confirms the above findings IMPRESSION: 1. 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 No reconstitution through the right ICA supraclinoid segment. 2. Right MCA and ACA origins are reconstituted with mild irregularity there. But otherwise symmetric anterior circulation enhancement and no anterior circulation branch occlusion identified. 3. Patent but diminutive posterior circulation appears to be on the basis of fetal type PCA origins. No significant posterior circulation stenosis. 4. Numerous small embolic appearing infarcts on brain MRI this morning are largely occult by CT. No hemorrhagic transformation or intracranial mass effect. 5. Spiculated left upper lobe lung nodules, see Chest CTA reported yesterday. Aortic Atherosclerosis (ICD10-I70.0) and Emphysema (ICD10-J43.9). Electronically Signed: By: VEAR Hurst M.D. On: 01/10/2024 11:55   EEG adult Result Date: 01/10/2024 Shelton Arlin KIDD, MD     01/10/2024  5:53 AM Patient Name: Lonnie Blair MRN: [id number] Epilepsy Attending: Arlin KIDD Shelton Referring Physician/Provider: [name], MD Date: 01/09/2024 Duration: 25.09 mins Patient history: 73yo M with left sided weakness and seizure. EEG to evaluate for seizure. Level of alertness: Awake AEDs during EEG study: None  Technical aspects: This EEG study was done with scalp electrodes positioned according to the 10-20 International system of electrode placement. Electrical activity was reviewed with band pass filter of 1-70Hz , sensitivity of 7 uV/mm, display speed of 82mm/sec  with a 60Hz  notched filter applied as appropriate. EEG data were recorded continuously and digitally stored.  Video monitoring was available and reviewed as appropriate. Description: The posterior dominant rhythm consists of 7.5 Hz activity of moderate voltage (25-35 uV) seen predominantly in posterior head regions, symmetric and reactive to eye opening and eye closing. EEG showed intermittent generalized and lateralized right hemisphere 3 to 6 Hz theta-delta slowing. Hyperventilation and photic stimulation were not performed.   ABNORMALITY - Intermittent slow, generalized and lateralized right hemisphere IMPRESSION: This study is suggestive of cortical dysfunction arising from right hemisphere likely secondary to underlying structural abnormality, post-ictal state. Additionally there is mild to moderate diffuse encephalopathy. No seizures or epileptiform discharges were seen throughout the recording. Arlin MALVA Krebs   MR BRAIN W WO CONTRAST Addendum Date: 01/10/2024 ADDENDUM REPORT: 01/10/2024 05:12 ADDENDUM: Study discussed by telephone with PA A. Chavez on 01/10/2024 at 0505 hours. Electronically Signed   By: VEAR Hurst M.D.   On: 01/10/2024 05:12   Result Date: 01/10/2024 CLINICAL DATA:  73 year old male with seizures. Metastatic pancreatic cancer. EXAM: MRI HEAD WITHOUT AND WITH CONTRAST TECHNIQUE: Multiplanar, multiecho pulse sequences of the brain and surrounding structures were obtained without and with intravenous contrast. CONTRAST:  5mL GADAVIST  GADOBUTROL  1 MMOL/ML IV SOLN COMPARISON:  Head CT yesterday. FINDINGS: Brain: Widely scattered small infarcts in the bilateral cerebellar and right greater than left cerebral hemispheres. Anterior and  posterior circulation affected, including the occipital poles. In the right hemisphere there is more conspicuous watershed pattern ischemia on series 5, image 81. The deep gray nuclei and brainstem are spared. T2 and FLAIR hyperintense cytotoxic edema in the affected areas. No acute or chronic intracranial hemorrhage identified. No abnormal enhancement identified. No dural thickening. No midline shift, mass effect, or evidence of intracranial mass lesion. No ventriculomegaly, extra-axial collection. Cervicomedullary junction and pituitary are within normal limits. And outside of the acute findings, the background gray and white matter signal is fairly normal for age. Vascular: Right ICA flow void is absent throughout the upper neck and siphon, with evidence of reconstituted flow at the right ICA terminus. Other Major intracranial vascular flow voids are preserved. Skull and upper cervical spine: Visualized bone marrow signal is within normal limits. Negative visible cervical spine for age. Sinuses/Orbits: Negative. Other: Mastoids are clear. Visible internal auditory structures appear normal. Negative visible scalp and face. IMPRESSION: 1. Numerous small Embolic appearing acute infarcts are widely scattered in the bilateral anterior and posterior circulation. No hemorrhagic transformation or mass effect. 2. Right ICA Poor flow or Occlusion in the neck and at the skull base, with evidence of reconstituted right ICA terminus. This is age indeterminate, but most likely nonacute given the infarct pattern in #1. 3.  No metastatic disease identified. Electronically Signed: By: VEAR Hurst M.D. On: 01/10/2024 04:52   CT Angio Chest PE W and/or Wo Contrast Result Date: 01/09/2024 CLINICAL DATA:  Unilateral left-sided weakness beginning over 1 week ago. Unable to move left arm or leg. Also with cough. Patient also with abdominal pain. EXAM: CT ANGIOGRAPHY CHEST CT ABDOMEN AND PELVIS WITH CONTRAST TECHNIQUE: Multidetector CT  imaging of the chest was performed using the standard protocol during bolus administration of intravenous contrast. Multiplanar CT image reconstructions and MIPs were obtained to evaluate the vascular anatomy. Multidetector CT imaging of the abdomen and pelvis was performed using the standard protocol during bolus administration of intravenous contrast. RADIATION DOSE REDUCTION: This exam was performed according to the departmental dose-optimization program which includes  automated exposure control, adjustment of the mA and/or kV according to patient size and/or use of iterative reconstruction technique. CONTRAST:  75mL OMNIPAQUE  IOHEXOL  350 MG/ML SOLN COMPARISON:  01/01/2023. FINDINGS: CTA CHEST FINDINGS Cardiovascular: Pulmonary arteries are well opacified. There is no evidence of a pulmonary embolism. Heart is normal in size. No pericardial effusion. Thoracic aorta is normal in caliber. There is aortic atherosclerosis, but no dissection. Mediastinum/Nodes: No neck base, mediastinal or hilar masses. No enlarged lymph nodes. Trachea esophagus are unremarkable. Lungs/Pleura: There is a spiculated nodule that abuts the overlying fluoro, associated with pleural thickening. Nodule measures 11 mm in greatest axial dimension and 1.4 cm in greatest dimension on the coronal sequence. This nodule has increased in size from the prior CT. And is centered on image 19, series 7. Small spiculated nodule, anterior left upper lobe, image 34, series 7, 7 x 4 mm, mean 5.5 mm, increased in size from the prior CT. 5 mm nodule, superior segment of the left lower lobe, image 28, series 7, abuts the oblique fissure. There is adjacent nodular pleural thickening of the upper aspect of the left oblique fissure. This is similar to the prior CT. Advanced centrilobular emphysema. Stable air cyst, right lower lobe. No lung consolidation or evidence of edema. No pleural effusion or pneumothorax. Musculoskeletal: No fracture or acute finding. No  bone lesion. No chest wall mass. Review of the MIP images confirms the above findings. CT ABDOMEN and PELVIS FINDINGS Hepatobiliary: Liver normal in size and overall attenuation. There are numerous small hypoattenuating masses, largest at the dome of the right lobe, 1.3 cm. Findings are consistent with metastatic disease. Gallbladder is unremarkable. No bile duct dilation. Pancreas: Pancreatic atrophy and irregular duct dilation. Both findings have progressed since the prior CT. Duct with a maximum diameter of 8 mm. Calcifications lie adjacent to the distal pancreatic and common bile duct in the pancreatic head new since the prior CT. No defined pancreatic mass. There is ill-defined soft tissue adjacent to the common bile duct along the porta hepatis. No evidence of pancreatic inflammation. Spleen: Normal in size without focal abnormality. Adrenals/Urinary Tract: No adrenal mass. Kidneys normal in overall size and position with symmetric enhancement. There are wedge-shaped areas of hypoattenuation, anterolateral left kidney, mid to lower pole and posterior upper pole the right kidney, new since the prior CT, consistent with infarcts. No defined renal mass. No hydronephrosis. Ureters are grossly normal in course and in caliber. Bladder is unremarkable. Stomach/Bowel: Stomach is mostly decompressed, otherwise unremarkable. Grossly normal appearance of the small bowel. Right colon is mildly distended up to 5 cm. There is no wall thickening or inflammation. Vascular/Lymphatic: Prominent and mildly enlarged shoddy lymph nodes in the upper abdomen, gastrohepatic ligament, Peri celiac and aortocaval. Gastrohepatic ligament node measures largest, 1.4 cm. Nodes have increased when compared to the prior CT. Dense aortoiliac atherosclerotic calcification. No aneurysm. Reproductive: Mild stable prostate enlargement. Other: No abdominal wall hernia.  No ascites. Musculoskeletal: No fracture or acute finding. No osteoblastic or  osteolytic lesions. Review of the MIP images confirms the above findings. IMPRESSION: CTA CHEST 1. No evidence of a pulmonary embolism. 2. No acute findings. 3. 2 left upper lobe pulmonary nodules, both spiculated appearance and suspicious, largest abutting the anterior pleural surface measuring 1.4 cm in greatest dimension. Both nodules have increased in size from the previous CT and are concerning for primary lung carcinoma. 4. Advanced emphysema.  Aortic atherosclerosis. CT ABDOMEN AND PELVIS 1. No acute findings. 2. Multiple low-attenuation liver masses  consistent with metastatic disease, new from the prior CT. There are also enlarged lymph nodes in the upper abdomen, largest along the gastrohepatic ligament, consistent with metastatic disease. History provided for the current head CT reports pancreatic cancer. 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 3. No defined pancreatic mass, although there is irregular dilation of the pancreatic duct that has increased when compared to the prior CT. 4. Dense aortic atherosclerosis. 5. Wedge-shaped hypoattenuating areas in both kidneys, new since the prior CT, suspected to be infarcts. Electronically Signed   By: Alm Parkins M.D.   On: 01/09/2024 14:53   CT ABDOMEN PELVIS W CONTRAST Result Date: 01/09/2024 CLINICAL DATA:  Unilateral left-sided weakness beginning over 1 week ago. Unable to move left arm or leg. Also with cough. Patient also with abdominal pain. EXAM: CT ANGIOGRAPHY CHEST CT ABDOMEN AND PELVIS WITH CONTRAST TECHNIQUE: Multidetector CT imaging of the chest was performed using the standard protocol during bolus administration of intravenous contrast. Multiplanar CT image reconstructions and MIPs were obtained to evaluate the vascular anatomy. Multidetector CT imaging of the abdomen and pelvis was performed using the standard protocol during bolus administration of intravenous contrast. RADIATION DOSE REDUCTION: This exam was performed  according to the departmental dose-optimization program which includes automated exposure control, adjustment of the mA and/or kV according to patient size and/or use of iterative reconstruction technique. CONTRAST:  75mL OMNIPAQUE  IOHEXOL  350 MG/ML SOLN COMPARISON:  01/01/2023. FINDINGS: CTA CHEST FINDINGS Cardiovascular: Pulmonary arteries are well opacified. There is no evidence of a pulmonary embolism. Heart is normal in size. No pericardial effusion. Thoracic aorta is normal in caliber. There is aortic atherosclerosis, but no dissection. Mediastinum/Nodes: No neck base, mediastinal or hilar masses. No enlarged lymph nodes. Trachea esophagus are unremarkable. Lungs/Pleura: There is a spiculated nodule that abuts the overlying fluoro, associated with pleural thickening. Nodule measures 11 mm in greatest axial dimension and 1.4 cm in greatest dimension on the coronal sequence. This nodule has increased in size from the prior CT. And is centered on image 19, series 7. Small spiculated nodule, anterior left upper lobe, image 34, series 7, 7 x 4 mm, mean 5.5 mm, increased in size from the prior CT. 5 mm nodule, superior segment of the left lower lobe, image 28, series 7, abuts the oblique fissure. There is adjacent nodular pleural thickening of the upper aspect of the left oblique fissure. This is similar to the prior CT. Advanced centrilobular emphysema. Stable air cyst, right lower lobe. No lung consolidation or evidence of edema. No pleural effusion or pneumothorax. Musculoskeletal: No fracture or acute finding. No bone lesion. No chest wall mass. Review of the MIP images confirms the above findings. CT ABDOMEN and PELVIS FINDINGS Hepatobiliary: Liver normal in size and overall attenuation. There are numerous small hypoattenuating masses, largest at the dome of the right lobe, 1.3 cm. Findings are consistent with metastatic disease. Gallbladder is unremarkable. No bile duct dilation. Pancreas: Pancreatic atrophy  and irregular duct dilation. Both findings have progressed since the prior CT. Duct with a maximum diameter of 8 mm. Calcifications lie adjacent to the distal pancreatic and common bile duct in the pancreatic head new since the prior CT. No defined pancreatic mass. There is ill-defined soft tissue adjacent to the common bile duct along the porta hepatis. No evidence of pancreatic inflammation. Spleen: Normal in size without focal abnormality. Adrenals/Urinary Tract: No adrenal mass. Kidneys normal in overall size and position with symmetric enhancement. There are wedge-shaped areas of hypoattenuation, anterolateral left  kidney, mid to lower pole and posterior upper pole the right kidney, new since the prior CT, consistent with infarcts. No defined renal mass. No hydronephrosis. Ureters are grossly normal in course and in caliber. Bladder is unremarkable. Stomach/Bowel: Stomach is mostly decompressed, otherwise unremarkable. Grossly normal appearance of the small bowel. Right colon is mildly distended up to 5 cm. There is no wall thickening or inflammation. Vascular/Lymphatic: Prominent and mildly enlarged shoddy lymph nodes in the upper abdomen, gastrohepatic ligament, Peri celiac and aortocaval. Gastrohepatic ligament node measures largest, 1.4 cm. Nodes have increased when compared to the prior CT. Dense aortoiliac atherosclerotic calcification. No aneurysm. Reproductive: Mild stable prostate enlargement. Other: No abdominal wall hernia.  No ascites. Musculoskeletal: No fracture or acute finding. No osteoblastic or osteolytic lesions. Review of the MIP images confirms the above findings. IMPRESSION: CTA CHEST 1. No evidence of a pulmonary embolism. 2. No acute findings. 3. 2 left upper lobe pulmonary nodules, both spiculated appearance and suspicious, largest abutting the anterior pleural surface measuring 1.4 cm in greatest dimension. Both nodules have increased in size from the previous CT and are concerning for  primary lung carcinoma. 4. Advanced emphysema.  Aortic atherosclerosis. CT ABDOMEN AND PELVIS 1. No acute findings. 2. Multiple low-attenuation liver masses consistent with metastatic disease, new from the prior CT. There are also enlarged lymph nodes in the upper abdomen, largest along the gastrohepatic ligament, consistent with metastatic disease. History provided for the current head CT reports pancreatic cancer. 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 3. No defined pancreatic mass, although there is irregular dilation of the pancreatic duct that has increased when compared to the prior CT. 4. Dense aortic atherosclerosis. 5. Wedge-shaped hypoattenuating areas in both kidneys, new since the prior CT, suspected to be infarcts. Electronically Signed   By: Alm Parkins M.D.   On: 01/09/2024 14:53   CT Head Wo Contrast Result Date: 01/09/2024 CLINICAL DATA:  Mental status change. Multiple seizures today. History of pancreatic carcinoma. EXAM: CT HEAD WITHOUT CONTRAST TECHNIQUE: Contiguous axial images were obtained from the base of the skull through the vertex without intravenous contrast. RADIATION DOSE REDUCTION: This exam was performed according to the departmental dose-optimization program which includes automated exposure control, adjustment of the mA and/or kV according to patient size and/or use of iterative reconstruction technique. COMPARISON:  12/26/2020. FINDINGS: Brain: No evidence of acute infarction, hemorrhage, hydrocephalus, extra-axial collection or mass lesion/mass effect. Vascular: No hyperdense vessel or unexpected calcification. Skull: Normal. Negative for fracture or focal lesion. Sinuses/Orbits: Globes and orbits are unremarkable. Visualized sinuses are clear. Other: None. IMPRESSION: No acute intracranial abnormalities. Electronically Signed   By: Alm Parkins M.D.   On: 01/09/2024 14:27   DG Elbow Complete Left Result Date: 01/09/2024 CLINICAL DATA:  Elbow pain.  History of pancreatic cancer. No history of reported trauma EXAM: LEFT ELBOW - COMPLETE 2 VIEW COMPARISON:  None Available. FINDINGS: Hyperostosis along the olecranon. No fracture or dislocation. Grossly preserved joint spaces. No large joint effusion on the lateral view is somewhat rotated limiting evaluation for subtle joint effusion. Repeat lateral film as clinically appropriate. IMPRESSION: Grossly no acute osseous abnormality.  Limited lateral view. Electronically Signed   By: Ranell Bring M.D.   On: 01/09/2024 12:39       Jochebed Bills M.D. [facility] 01/11/2024, 11:09 AM  Available via Epic secure chat 7am-7pm After 7 pm, please refer to night coverage provider listed on amion.

## 2024-01-11 NOTE — Progress Notes (Signed)
 BLE venous duplex has been completed.   Results can be found under chart review under CV PROC. 01/11/2024 5:04 PM Nina Hoar RVT, RDMS

## 2024-01-11 NOTE — Evaluation (Addendum)
 Physical Therapy Evaluation Patient Details Name: Lonnie Blair MRN: [id number] DOB: 1951-01-03 Today's Date: 01/11/2024  History of Present Illness  Pt is a 73 y/o male presenting on 7/19 with L sided weakness. 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 EEG with cortical dysfunction from R hemisphere, postictal state. COVID +. CTA negative for PE but concern for lung CA and advanced emphysema, CT abd concern for metastatic disease. PMH includes: seizures, pancreatic cancer (?per family).  Clinical Impression  Pt admitted with above. 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 Pt presents with mild dynamic balance deficits and left sided weakness. Pt overall is mobilizing fairly, ambulating 550 ft with no assistive device and CGA. No DOE. Pt will benefit from follow up OPPT to address deficits and maximize functional independence.       If plan is discharge home, recommend the following: Assistance with cooking/housework;Assist for transportation;Help with stairs or ramp for entrance   Can travel by private vehicle        Equipment Recommendations None recommended by PT  Recommendations for Other Services       Functional Status Assessment Patient has had a recent decline in their functional status and demonstrates the ability to make significant improvements in function in a reasonable and predictable amount of time.     Precautions / Restrictions Precautions Precautions: Fall Restrictions Weight Bearing Restrictions Per Provider Order: No      Mobility  Bed Mobility Overal bed mobility: Modified Independent                  Transfers Overall transfer level: Independent Equipment used: None                    Ambulation/Gait Ambulation/Gait assistance: Contact guard assist Gait Distance (Feet): 550 Feet Assistive device: None Gait Pattern/deviations: Step-through pattern, Decreased step  length - left       General Gait Details: Mildly decreased L step length, dynamic instability with turns  Stairs            Wheelchair Mobility     Tilt Bed    Modified Rankin (Stroke Patients Only) Modified Rankin (Stroke Patients Only) Pre-Morbid Rankin Score: No symptoms Modified Rankin: Slight disability     Balance Overall balance assessment: Mild deficits observed, not formally tested                                           Pertinent Vitals/Pain Pain Assessment Pain Assessment: Faces Faces Pain Scale: Hurts a little bit Pain Location: LLE Pain Descriptors / Indicators: Tender Pain Intervention(s): Monitored during session    Home Living Family/patient expects to be discharged to:: Private residence Living Arrangements: Spouse/significant other Available Help at Discharge: Family Type of Home: House Home Access: Stairs to enter Entrance Stairs-Rails: Right;Left;Can reach both Entrance Stairs-Number of Steps: 4   Home Layout: One level Home Equipment: Grab bars - tub/shower;Grab bars - toilet;Shower seat - built in      Prior Function Prior Level of Function : Independent/Modified Independent;Working/employed;Driving             Mobility Comments: Owns lawn business ADLs Comments: indepedent, working and driving     Extremity/Trunk Assessment   Upper Extremity Assessment Upper Extremity Assessment: Defer to OT evaluation    Lower Extremity Assessment Lower Extremity Assessment: RLE  deficits/detail;LLE deficits/detail RLE Deficits / Details: Grossly 4+/5 LLE Deficits / Details: Grossly 4-/5    Cervical / Trunk Assessment Cervical / Trunk Assessment: Normal  Communication   Communication Communication: No apparent difficulties    Cognition Arousal: Alert Behavior During Therapy: WFL for tasks assessed/performed   PT - Cognitive impairments: No apparent impairments                         Following  commands: Intact       Cueing Cueing Techniques: Verbal cues     General Comments      Exercises     Assessment/Plan    PT Assessment Patient needs continued PT services  PT Problem List Decreased strength;Decreased balance;Decreased mobility       PT Treatment Interventions Gait training;Stair training;Functional mobility training;Therapeutic activities;Therapeutic exercise;Balance training;Patient/family education    PT Goals (Current goals can be found in the Care Plan section)  Acute Rehab PT Goals Patient Stated Goal: improved balance PT Goal Formulation: With patient Time For Goal Achievement: 01/25/24 Potential to Achieve Goals: Good    Frequency Min 1X/week     Co-evaluation               AM-PAC PT 6 Clicks Mobility  Outcome Measure Help needed turning from your back to your side while in a flat bed without using bedrails?: None Help needed moving from lying on your back to sitting on the side of a flat bed without using bedrails?: None Help needed moving to and from a bed to a chair (including a wheelchair)?: None Help needed standing up from a chair using your arms (e.g., wheelchair or bedside chair)?: None Help needed to walk in hospital room?: A Little Help needed climbing 3-5 steps with a railing? : A Little 6 Click Score: 22    End of Session Equipment Utilized During Treatment: Gait belt Activity Tolerance: Patient tolerated treatment well Patient left: in bed;with call bell/phone within reach Nurse Communication: Mobility status PT Visit Diagnosis: Unsteadiness on feet (R26.81)    Time: 0950-1010 PT Time Calculation (min) (ACUTE ONLY): 20 min   Charges:   PT Evaluation $PT Eval Low Complexity: 1 Low   PT General Charges $$ ACUTE PT VISIT: 1 Visit         Lonnie Blair, PT, DPT Acute Rehabilitation Services Office [phone]   Lonnie Blair 01/11/2024, 12:47 PM

## 2024-01-11 NOTE — Progress Notes (Signed)
 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  Goal of Therapy:  Heparin  level 0.3-0.5 units/ml   Plan:  Increase heparin  infusion by 1 unit/kg/hr to 750 units/hr. Check level in 8 hours.   Marvetta Dauphin, PharmD, BCPS 01/11/2024 3:19 AM

## 2024-01-11 NOTE — Progress Notes (Signed)
 Patient  tentatively scheduled for 7.21.25  in Interventional Radiology  today. Please  reschedule for 7.22.25 secondary IR schedule   Team made aware via EPIC Chart. Request sent to pharmacy to hold heparin  gtt @ 0600.   If the patient is hemodynamically stable for discharge this procedure can be scheduled as OP if needed

## 2024-01-11 NOTE — Progress Notes (Signed)
 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   Allergies  Allergen Reactions   Tylenol  [Acetaminophen ] Itching    Patient Measurements: Height: 5' 6 (167.6 cm) Weight: 48 kg (105 lb 13.1 oz) IBW/kg (Calculated) : 63.8 HEPARIN  DW (KG): 48  Vital Signs: Temp: 97.2 F (36.2 C) (07/21 1147) Temp Source: Oral (07/21 1147) BP: 121/77 (07/21 1150) Pulse Rate: 93 (07/21 1147)  Labs: Recent Labs    01/09/24 1157 01/09/24 1901 01/10/24 0748 01/10/24 1648 01/11/24 0228 01/11/24 0451 01/11/24 1126  HGB 11.5* 11.9* 12.1*  --   --  12.4*  --   HCT 35.4* 36.6* 37.5*  --   --  38.0*  --   PLT 121* 92* 91*  --   --  94*  --   LABPROT 13.8  --   --   --   --  13.1  --   INR 1.0  --   --   --   --  0.9  --   HEPARINUNFRC  --   --   --  0.17* 0.29*  --  0.36  CREATININE 1.03 0.88 0.80  --   --  0.83  --   CKTOTAL 94  --   --   --   --   --   --     Estimated Creatinine Clearance: 53.8 mL/min (by C-G formula based on SCr of 0.83 mg/dL).   Medical History: Past Medical History:  Diagnosis Date   Seizures (HCC)     Medications:  Medications Prior to Admission  Medication Sig Dispense Refill Last Dose/Taking   albuterol  (VENTOLIN  HFA) 108 (90 Base) MCG/ACT inhaler Inhale 1-2 puffs into the lungs every 6 (six) hours as needed for wheezing or shortness of breath.   Unknown   ipratropium-albuterol  (DUONEB) 0.5-2.5 (3) MG/3ML SOLN Take 3 mLs by nebulization every 6 (six) hours as needed.   01/09/2024   lactose free nutrition (BOOST PLUS) LIQD Take 237 mLs by mouth once a week. Drink 237 ml's of Strawberry Boost Plus   Past Week   zonisamide  (ZONEGRAN ) 100 MG capsule Take 100 mg by mouth in the morning and at bedtime.   01/09/2024 Morning   NICOTINE  TD Place 1 patch onto the skin daily. (Patient not taking: Reported on 01/09/2024)   Not Taking   Scheduled:   arformoterol   15 mcg  Nebulization BID   atorvastatin   40 mg Oral Daily   budesonide  (PULMICORT ) nebulizer solution  0.25 mg Nebulization BID   docusate sodium   100 mg Oral BID   folic acid   1 mg Oral Daily   multivitamin with minerals  1 tablet Oral Daily   nicotine   21 mg Transdermal Daily   senna-docusate  1 tablet Oral BID   thiamine   100 mg Oral Daily   Or   thiamine   100 mg Intravenous Daily   zonisamide   100 mg Oral q AM   And   zonisamide   200 mg Oral QHS   Infusions:   sodium chloride      ampicillin -sulbactam (UNASYN ) IV 3 g (01/11/24 1155)   heparin  750 Units/hr (01/11/24 0323)    Assessment: Patient is a 73 year old man who presented to the ED with 1 week of generalized weakness, shortness of breath, coughing, and confusion. Patient does have a history of seizure disorders. The patient had 2 seizures prior to admission and reported acute left-sided weakness to EMS. No prior history  of stroke. MRI revealed multiple embolic infarcts, meaning patient is likely hypercoagulable in the setting of cancer.   7/21 AM update: HL 0.36 Hgb 12.4 PLT 94K No signs of bleeding or pauses in gtt  Goal of Therapy:  Heparin  level 0.3-0.5 units/ml Monitor platelets by anticoagulation protocol: Yes   Plan:  Continue heparin  infusion to 750 units/hr Check confirmatory HL in 8 hours Continue to monitor H&H and platelets. Monitor for any signs or symptoms of bleeding.  Thank you for allowing pharmacy to be a part of this patient's care.  Benedetta Heath BS, PharmD, BCPS Clinical Pharmacist 01/11/2024 12:50 PM  Contact: [phone] after 3 PM

## 2024-01-11 NOTE — Progress Notes (Addendum)
 STROKE TEAM PROGRESS NOTE   INTERIM HISTORY/SUBJECTIVE OT at the bedside. Reported that he worked with PT well, but when working with OT, he walked to bathroom and then started not responding to questions and dragging on the feet. He was put back in bed, initial BP 107/83 and then 121/77. Pt stated that he felt about to pass out and he has been NPO since midnight pending for IR.   OBJECTIVE  CBC    Component Value Date/Time   WBC 10.5 01/11/2024 0451   RBC 4.26 01/11/2024 0451   HGB 12.4 (L) 01/11/2024 0451   HCT 38.0 (L) 01/11/2024 0451   PLT 94 (L) 01/11/2024 0451   MCV 89.2 01/11/2024 0451   MCH 29.1 01/11/2024 0451   MCHC 32.6 01/11/2024 0451   RDW 13.2 01/11/2024 0451   LYMPHSABS 2.1 01/09/2024 1157   MONOABS 1.1 (H) 01/09/2024 1157   EOSABS 0.4 01/09/2024 1157   BASOSABS 0.0 01/09/2024 1157    BMET    Component Value Date/Time   NA 137 01/11/2024 0451   K 3.6 01/11/2024 0451   CL 101 01/11/2024 0451   CO2 23 01/11/2024 0451   GLUCOSE 70 01/11/2024 0451   BUN 8 01/11/2024 0451   CREATININE 0.83 01/11/2024 0451   CALCIUM  9.1 01/11/2024 0451   GFRNONAA >60 01/11/2024 0451    IMAGING past 24 hours No results found.   Vitals:   01/11/24 0808 01/11/24 1147 01/11/24 1148 01/11/24 1150  BP:  128/87 107/83 121/77  Pulse:  93    Resp:  18    Temp:  (!) 97.2 F (36.2 C)    TempSrc:  Oral    SpO2: 97% 100%    Weight:      Height:         PHYSICAL EXAM General: Ill-appearing patient in no acute distress CV: Regular rate and rhythm on monitor Respiratory:  Regular, unlabored respirations on room air   NEURO:  Mental Status: AA&Ox3, patient is able to give clear and coherent history Speech/Language: speech is without dysarthria or aphasia.  Naming, repetition, fluency, and comprehension intact.  Cranial Nerves:  II: PERRL. Visual fields full.  III, IV, VI: EOMI. Eyelids elevate symmetrically.  V: Sensation is intact to light touch and symmetrical to face.   VII: Face is symmetrical resting and smiling VIII: hearing intact to voice. IX, X: Palate elevates symmetrically. Phonation is normal.  KP:Dynloizm shrug 5/5. XII: tongue is midline without fasciculations. Motor: Generalized weakness, no focal deficits. Tone: is normal and bulk is normal Sensation- Intact to light touch bilaterally. Extinction absent to light touch to DSS.   Coordination: FTN slow but intact bilaterally Gait- deferred  Most Recent NIH: 0.     ASSESSMENT/PLAN  Mr. Lonnie Blair is a 73 y.o. male with  hx of COPD, tobacco use, alcohol use, lung nodule, seizures on zonisamide  who presents with generalized weakness, cough, shortness of breath and confusion.  Reportedly had 104 fever at home, has been very tired and somnolent with nausea and vomiting and 2 seizures at home. NIH on Admission 0. WBC of 15.  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 He is positive for COVID. He had a routine EEG which did not demonstrate any seizures. 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s. Neurology was consulted further evaluation workup.  Stroke:  embolic shower with bilateral anterior and posterior circulation, etiology likely due to hypercoagulable state  from advanced malignancy  CT head CTH was negative for a large hypodensity concerning for a large territory infarct or hyperdensity concerning for an ICH  CTA head & neck: Right ICA occlusion beginning at bulb which does appear gradual in acute, small volume adherent thrombus within the lumen of right ICA origin. Right MCA and ACA origins are reconstituted with mild irregularity. Patent but diminutive posterior circulation appears to be on basis of fetal type PCA.   MRI Numerous small Embolic appearing acute infarcts are widely scattered in the bilateral anterior and posterior circulation.  2D Echo: EF 60-65%, grade III of  aortic atheroma LE venous doppler - no DVT LDL 92 HgbA1c 5.7 UDS neg VTE prophylaxis - Heparin  IV  No antithrombotic prior to admission, now on heparin  IV. Consider to switch to DOAC once no more procedure planned.  Therapy recommendation: outpt PT Disposition:  pending  Breakthrough Seizures 2/2 COVID and stroke Hx of seizure on zonagram 100 bid EEG showed cortical dysfunction arising from right hemisphere likely secondary to underlying structural abnormality, post-ictal state. Additionally there is  mild to moderate diffuse encephalopathy. No seizures or epileptiform discharges were seen throughout the recording Increased dose fo home zonisamide  100mg  in AM and 200mg  in PM.   Hypertension Orthostasis  Home meds:  none Had pre-syncope episode 7/21 BP 107/83->121/77 Put on IVF given NPO status this am Long term BP goal normotensive  Hyperlipidemia Home meds:  none LDL 92, goal < 70 Started on Lipitor 40mg  Continue on discharge  Tobacco Abuse Patient is currently on Nicotine  patches at home       Ready to quit? Yes Nicotine  replacement therapy continued  Other Stroke Risk Factors Advanced age ETOH use, alcohol level <15, advised to stop drinking  Other Active Problems COPD history of etoh abuse, alcoholic pancreatitis.  ?Metastatic Pancreatic Cancer versus Lung Cancer Pt cachectic  CTA Chest:  2 left upper lobe pulmonary nodules, both spiculated appearance and suspicious, largest abutting the anterior pleural surface measuring 1.4 cm in greatest dimension. Both nodules have increased in size from the previous CT and are concerning for primary lung carcinoma. CTA Abdomen: Multiple low-attenuation liver masses consistent with metastatic disease, new from the prior CT. There are also enlarged lymph nodes in the upper abdomen, largest along the gastrohepatic ligament, consistent with metastatic disease. History provided for the current head CT reports pancreatic cancer.  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 Findings were not reported on CTA Abdomen 12/29/2022 Pending US  Liver Biopsy  Hospital day # 2  Neurology will sign off. Please call with questions. Pt will follow up with Dr. Tonuzi in about 4 weeks. Thanks for the consult.   Ary Cummins, MD PhD Stroke Neurology 01/11/2024 12:03 PM   To contact Stroke Continuity provider, please refer to [email or web address]. After hours, contact General Neurology

## 2024-01-12 ENCOUNTER — Inpatient Hospital Stay (HOSPITAL_COMMUNITY)

## 2024-01-12 DIAGNOSIS — R531 Weakness: Secondary | ICD-10-CM | POA: Diagnosis not present

## 2024-01-12 DIAGNOSIS — U071 COVID-19: Secondary | ICD-10-CM | POA: Diagnosis not present

## 2024-01-12 DIAGNOSIS — I639 Cerebral infarction, unspecified: Secondary | ICD-10-CM | POA: Diagnosis not present

## 2024-01-12 DIAGNOSIS — E860 Dehydration: Secondary | ICD-10-CM | POA: Diagnosis not present

## 2024-01-12 LAB — COMPREHENSIVE METABOLIC PANEL WITH GFR
ALT: 25 U/L (ref 0–44)
AST: 47 U/L — ABNORMAL HIGH (ref 15–41)
Albumin: 2.7 g/dL — ABNORMAL LOW (ref 3.5–5.0)
Alkaline Phosphatase: 159 U/L — ABNORMAL HIGH (ref 38–126)
Anion gap: 11 (ref 5–15)
BUN: 10 mg/dL (ref 8–23)
CO2: 23 mmol/L (ref 22–32)
Calcium: 8.8 mg/dL — ABNORMAL LOW (ref 8.9–10.3)
Chloride: 101 mmol/L (ref 98–111)
Creatinine, Ser: 1.18 mg/dL (ref 0.61–1.24)
GFR, Estimated: >60 mL/min (ref >60)
Glucose, Bld: 85 mg/dL (ref 70–99)
Potassium: 3.3 mmol/L — ABNORMAL LOW (ref 3.5–5.1)
Sodium: 135 mmol/L (ref 135–145)
Total Bilirubin: 0.4 mg/dL (ref 0.0–1.2)
Total Protein: 6.5 g/dL (ref 6.5–8.1)

## 2024-01-12 LAB — CBC
HCT: 34.9 % — ABNORMAL LOW (ref 39.0–52.0)
Hemoglobin: 11.4 g/dL — ABNORMAL LOW (ref 13.0–17.0)
MCH: 29.3 pg (ref 26.0–34.0)
MCHC: 32.7 g/dL (ref 30.0–36.0)
MCV: 89.7 fL (ref 80.0–100.0)
Platelets: 145 K/uL — ABNORMAL LOW (ref 150–400)
RBC: 3.89 MIL/uL — ABNORMAL LOW (ref 4.22–5.81)
RDW: 13.2 % (ref 11.5–15.5)
WBC: 9.1 K/uL (ref 4.0–10.5)
nRBC: 0 % (ref 0.0–0.2)

## 2024-01-12 LAB — HEPARIN LEVEL (UNFRACTIONATED)
Heparin Unfractionated: 0.37 [IU]/mL (ref 0.30–0.70)
Heparin Unfractionated: 0.19 [IU]/mL — ABNORMAL LOW (ref 0.30–0.70)

## 2024-01-12 LAB — CEA: CEA: 747 ng/mL — ABNORMAL HIGH (ref 0.0–4.7)

## 2024-01-12 LAB — SURGICAL PCR SCREEN
MRSA, PCR: NEGATIVE
Staphylococcus aureus: NEGATIVE

## 2024-01-12 LAB — CANCER ANTIGEN 19-9: CA 19-9: 74 U/mL — ABNORMAL HIGH (ref 0–35)

## 2024-01-12 LAB — AFP TUMOR MARKER: AFP, Serum, Tumor Marker: 8.8 ng/mL — ABNORMAL HIGH (ref 0.0–8.4)

## 2024-01-12 MED ORDER — HEPARIN (PORCINE) 25000 UT/250ML-% IV SOLN
850.0000 [IU]/h | INTRAVENOUS | Status: DC
  Administered 2024-01-12: 750 [IU]/h via INTRAVENOUS
  Filled 2024-01-12: qty 250

## 2024-01-12 MED ORDER — GELATIN ABSORBABLE 12-7 MM EX MISC
1.0000 | Freq: Once | CUTANEOUS | Status: AC
  Administered 2024-01-12: 1 via TOPICAL
  Filled 2024-01-12: qty 1

## 2024-01-12 MED ORDER — FENTANYL CITRATE (PF) 100 MCG/2ML IJ SOLN
INTRAMUSCULAR | Status: AC | PRN
  Administered 2024-01-12: 25 ug via INTRAVENOUS

## 2024-01-12 MED ORDER — OXIDIZED CELLULOSE EX PADS
1.0000 | MEDICATED_PAD | Freq: Once | CUTANEOUS | Status: DC
  Filled 2024-01-12: qty 1

## 2024-01-12 MED ORDER — MIDAZOLAM HCL 2 MG/2ML IJ SOLN
INTRAMUSCULAR | Status: AC | PRN
  Administered 2024-01-12: 1 mg via INTRAVENOUS

## 2024-01-12 MED ORDER — LIDOCAINE HCL (PF) 1 % IJ SOLN
6.0000 mL | Freq: Once | INTRAMUSCULAR | Status: AC
  Administered 2024-01-12: 6 mL via INTRADERMAL

## 2024-01-12 MED ORDER — FENTANYL CITRATE (PF) 100 MCG/2ML IJ SOLN
INTRAMUSCULAR | Status: AC
  Filled 2024-01-12: qty 2

## 2024-01-12 MED ORDER — ENSURE PLUS HIGH PROTEIN PO LIQD
237.0000 mL | Freq: Two times a day (BID) | ORAL | Status: DC
  Administered 2024-01-13: 237 mL via ORAL

## 2024-01-12 MED ORDER — MIDAZOLAM HCL 2 MG/2ML IJ SOLN
INTRAMUSCULAR | Status: AC
  Filled 2024-01-12: qty 2

## 2024-01-12 MED ORDER — POTASSIUM CHLORIDE CRYS ER 20 MEQ PO TBCR
40.0000 meq | EXTENDED_RELEASE_TABLET | Freq: Once | ORAL | Status: AC
  Administered 2024-01-12: 40 meq via ORAL
  Filled 2024-01-12: qty 2

## 2024-01-12 NOTE — Progress Notes (Signed)
 Back from bx. No signs of distress.  Denies SOB or pain at biopsy site. No tachycardia and no hypotensive episodes. In good spirits and ready to eat.

## 2024-01-12 NOTE — Plan of Care (Signed)
 Pt stated today was a much better day.  He is alert and oriented and has denied dizziness or feeling funny like he did yesterday.  Wife has been worried, as she thinks she probably has COVID as well and does not want to come see him to cause him or her to get worse.     Problem: Education: Goal: Knowledge of risk factors and measures for prevention of condition will improve Outcome: Progressing   Problem: Education: Goal: Knowledge of General Education information will improve Description: Including pain rating scale, medication(s)/side effects and non-pharmacologic comfort measures Outcome: Progressing   Problem: Clinical Measurements: Goal: Will remain free from infection Outcome: Progressing   Problem: Clinical Measurements: Goal: Respiratory complications will improve Outcome: Progressing   Problem: Activity: Goal: Risk for activity intolerance will decrease Outcome: Progressing   Problem: Skin Integrity: Goal: Risk for impaired skin integrity will decrease Outcome: Progressing

## 2024-01-12 NOTE — Progress Notes (Signed)
 SLP Cancellation Note  Patient Details Name: Lonnie Blair MRN: [id number] DOB: [birth year]   Cancelled treatment:       Reason Eval/Treat Not Completed: Other (comment) (pt npo for potential procedure in IR, will continue efforts)  Madelin POUR, MS [facility] SLP Acute Rehab Services Office [phone]   Nicolas Emmie Caldron 01/12/2024, 7:27 AM

## 2024-01-12 NOTE — Progress Notes (Signed)
 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   Allergies  Allergen Reactions   Tylenol  [Acetaminophen ] Itching    Patient Measurements: Height: 5' 6 (167.6 cm) Weight: 48 kg (105 lb 13.1 oz) IBW/kg (Calculated) : 63.8 HEPARIN  DW (KG): 48  Vital Signs: Temp: 98.2 F (36.8 C) (07/22 1932) Temp Source: Oral (07/22 1932) BP: 150/83 (07/22 1932) Pulse Rate: 73 (07/22 1932)  Labs: Recent Labs    01/10/24 0748 01/10/24 1648 01/11/24 0451 01/11/24 1126 01/11/24 1838 01/12/24 0435 01/12/24 2226  HGB 12.1*  --  12.4*  --   --  11.4*  --   HCT 37.5*  --  38.0*  --   --  34.9*  --   PLT 91*  --  94*  --   --  145*  --   LABPROT  --   --  13.1  --   --   --   --   INR  --   --  0.9  --   --   --   --   HEPARINUNFRC  --    < >  --    < > 0.34 0.37 0.19*  CREATININE 0.80  --  0.83  --   --  1.18  --    < > = values in this interval not displayed.    Estimated Creatinine Clearance: 37.9 mL/min (by C-G formula based on SCr of 1.18 mg/dL).  Assessment: Patient is a 73 year old man who presented to the ED with 1 week of generalized weakness, shortness of breath, coughing, and confusion. Patient does have a history of seizure disorders. The patient had 2 seizures prior to admission and reported acute left-sided weakness to EMS. No prior history of stroke. MRI revealed multiple embolic infarcts, meaning patient is likely hypercoagulable in the setting of cancer.   Heparin  level subtherapeutic (0.19) on infusion at 750 units/hr. No issues with line or bleeding reported per RN.  Goal of Therapy:  Heparin  level 0.3-0.5 units/ml Monitor platelets by anticoagulation protocol: Yes   Plan:  Increase heparin  infusion to 850 units/hr Will f/u 8hr heparin  level  Thank you for allowing pharmacy to be a part of this patient's care.  Vito Ralph, PharmD, BCPS Please see amion for complete  clinical pharmacist phone list 01/12/2024 11:31 PM

## 2024-01-12 NOTE — Progress Notes (Signed)
 Transported to IR in bed.

## 2024-01-12 NOTE — Procedures (Signed)
 Interventional Radiology Procedure Note  Procedure: US  CORE BX LEFT LIVER MET    Complications: None  Estimated Blood Loss:  MIN  Findings: 85 G CORE X 2    M. FREDERIC SPECKING, MD

## 2024-01-12 NOTE — TOC CM/SW Note (Signed)
 Transition of Care [facility]) - Inpatient Brief Assessment   Patient Details  Name: Lonnie Blair MRN: [id number] Date of Birth: 13-Aug-1950  Transition of Care [facility]) CM/SW Contact:    Andrez JULIANNA Carvel, RN Phone Number: 01/12/2024, 2:31 PM   Clinical Narrative:  Pt is covid +. S/p liver bx.  Current recommendations are for outpt therapy. CM will arrange outpt therapy closer to d/c.  IP Care Management following.  Transition of Care Asessment: Insurance and Status: Insurance coverage has been reviewed Patient has primary care physician: Yes Home environment has been reviewed: home with spouse   Prior/Current Home Services: No current home services Social Drivers of Health Review: SDOH reviewed no interventions necessary Readmission risk has been reviewed: Yes Transition of care needs: transition of care needs identified, TOC will continue to follow

## 2024-01-12 NOTE — Progress Notes (Signed)
 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9  --   --   --   HEPARINUNFRC  --    < >  --  0.36 0.34 0.37  CREATININE 0.80  --  0.83  --   --  1.18   < > = values in this interval not displayed.    Estimated Creatinine Clearance: 37.9 mL/min (by C-G formula based on SCr of 1.18 mg/dL).   Medical History: Past Medical History:  Diagnosis Date   Seizures (HCC)     Medications:  Medications Prior to Admission  Medication Sig Dispense Refill Last Dose/Taking   albuterol  (VENTOLIN  HFA) 108 (90 Base) MCG/ACT inhaler Inhale 1-2 puffs into the lungs every 6 (six) hours as needed for wheezing or shortness of breath.   Unknown   ipratropium-albuterol  (DUONEB) 0.5-2.5 (3) MG/3ML SOLN Take 3 mLs by nebulization every 6 (six) hours as needed.   01/09/2024   lactose free nutrition (BOOST PLUS) LIQD Take 237 mLs by mouth once a week. Drink 237 ml's of Strawberry Boost Plus   Past Week   zonisamide  (ZONEGRAN ) 100 MG capsule Take 100 mg by mouth in the morning and at bedtime.   01/09/2024 Morning   NICOTINE  TD Place 1 patch onto the skin daily. (Patient not taking: Reported on 01/09/2024)   Not Taking   Scheduled:   arformoterol   15 mcg Nebulization BID   atorvastatin   40 mg Oral Daily    budesonide  (PULMICORT ) nebulizer solution  0.25 mg Nebulization BID   docusate sodium   100 mg Oral BID   folic acid   1 mg Oral Daily   multivitamin with minerals  1 tablet Oral Daily   nicotine   21 mg Transdermal Daily   potassium chloride   40 mEq Oral Once   senna-docusate  1 tablet Oral BID   thiamine   100 mg Oral Daily   Or   thiamine   100 mg Intravenous Daily   zonisamide   100 mg Oral q AM   And   zonisamide   200 mg Oral QHS   Infusions:   sodium chloride  75 mL/hr at 01/12/24 1030   ampicillin -sulbactam (UNASYN ) IV 3 g (01/12/24 0455)   heparin       Assessment: Patient is a 73 year old man who presented to the ED with 1 week of generalized weakness, shortness of breath, coughing, and confusion. Patient does have a history of seizure disorders. The patient had 2 seizures prior to admission and reported acute left-sided weakness to EMS. No prior history of stroke. MRI revealed multiple embolic infarcts, meaning patient is  likely hypercoagulable in the setting of cancer.   7/22 AM update: HL 0.37 Hgb 11.4 PLT 144K No signs of bleeding or pauses in gtt   Goal of Therapy:  Heparin  level 0.3-0.5 units/ml Monitor platelets by anticoagulation protocol: Yes   Plan:  Restart heparin  infusion at 750 units/hr 7/22 at 1630 HL 2300 Check daily heparin  level Continue to monitor H&H and platelets. Monitor for any signs or symptoms of bleeding. Follow up transition to oral anticoagulation.  Thank you for allowing pharmacy to be a part of this patient's care.  Benedetta Heath BS, PharmD, BCPS Clinical Pharmacist 01/12/2024 12:53 PM  Contact: [phone] after 3 PM

## 2024-01-12 NOTE — Progress Notes (Signed)
 [facility]                                                                              Lonnie Blair, is a 73 y.o. male, DOB - Dec 30, 1950, FMW:[id number] Admit date - 01/09/2024    Outpatient Primary MD for the patient is [name], FNP  LOS - 3  days  Chief Complaint  Patient presents with   Neurologic Problem       Brief summary   Patient is a 73 year old male with history of seizures, sonogram, negative findings, alcohol use, previous pancreatitis and COPD presented with 1 week of generalized weakness, shortness of breath, coughing, confusion.  Per family he was out of it with this illness, very weak, had 2 seizures witnessed by his family.  He also reported nausea vomiting and abdominal pain, last BM on Thursday, 3 days PTA.  Family also reported a fever of 104 F 2 days prior to admission, very tired and somnolent.  Per wife, they called EMS today and was noted to have acute left-sided weakness.  No prior history of stroke. Per family, they have had a viral illness in the last week.   Smokes half pack per day, drinks 2 beers every other day, ambulates at baseline without any difficulty. Family also reported previous mention of pancreatic carcinoma last year  however have not seen oncology  CTA chest showed no PE, pulm left upper lobe spiculated pulmonary nodules, concerning for primary lung CA, advanced emphysema CT abdomen pelvis showed multiple liver masses consistent with metastatic disease, new.  No defined pancreatic mass although there is irregular dilation of the pancreatic head increased, wedge-shaped hypoattenuating areas in both kidneys, new.    Assessment & Plan     Acute left-sided weakness/multiple embolic CVAs - Per family, patient had left-sided weakness noted this morning, had 2 seizures prior to admission. New findings of metastatic disease on CT chest abdomen pelvis. - MRI brain showed numerous small embolic appearing acute  infarcts scattered in the bilateral anterior and posterior circulation, no hemorrhagic transfer effect.  Right ICA poor flow or occlusion in the neck with evidence of reconstituted right ICA terminus.  No metastatic disease. - Seen by neurology, started on IV heparin  drip  - CTA head and neck showed right ICA occlusion beginning at the valve which she does appear gradual and acute with a small volume adherent thrombus in the lumen of the right ICA origin, right MCA and ACA origins are reconstituted, numerous small embolic appearing infarcts - 2D echo showed EF of 60 to 65%, G1 DD, no regional WMA moderate protruding plaque involving the descending aorta - Lipid panel showed LDL 92,  goal <70, on Lipitor 40 mg daily - Hemoglobin A1c 5.7 - On IV heparin  drip, transition to DOAC once procedures are completed   Seizures, breakthrough -  received 1 dose of Keppra  IV in ED -EEG showed cortical dysfunction from the right hemisphere likely secondary to underlying structural abnormality, postictal state, mild to moderate diffuse encephalopathy, no seizures or epileptiform discharges -Increased zonisamide  to 100 mg a.m., 200 mg p.m.     Metastatic disease with lung mass,  pancreatic mass, liver lesions -Per patient's family, there has been mention of pancreatic cancer in the past however they have not seen oncology.  - Appreciate pulmonology evaluation - Plan for liver biopsy today. - Oncology consulted, Dr. Sherrod   COVID 19 viral infection, COPD exacerbation - Ongoing symptoms for last 1 week, not be a candidate for antiviral now.   - Continue supportive treatment, IV fluids, DuoNebs, Pulmicort , Brovana   - Continue IV Unasyn  - Blood cultures NTD     Nicotine  abuse - Counseled on smoking cessation, placed on nicotine  patch   Nausea vomiting, abdominal pain, constipation - CT abdomen did not show any SBO or ileus, however has metastatic disease with possibility of pancreatic CA - Supportive  care with antiemetics, bowel regimen     Generalized debility - PT OT evaluation   History of alcohol use -Placed on CIWA protocol with Ativan  -Thiamine , folate, MVI  Underweight, moderate protein calorie malnutrition, hypoalbuminemia Estimated body mass index is 17.08 kg/m as calculated from the following:   Height as of this encounter: 5' 6 (1.676 m).   Weight as of this encounter: 48 kg.  Code Status: Full code DVT Prophylaxis:  IV heparin  drip   Level of Care: Level of care: Progressive Family Communication: Disposition Plan:      Remains inpatient appropriate: Workup in progress   Procedures:    Consultants:   Neurology Pulmonology IR Oncology  Antimicrobials:   Anti-infectives (From admission, onward)    Start     Dose/Rate Route Frequency Ordered Stop   01/09/24 1700  Ampicillin -Sulbactam (UNASYN ) 3 g in sodium chloride  0.9 % 100 mL IVPB        3 g 200 mL/hr over 30 Minutes Intravenous Every 6 hours 01/09/24 1658            Medications  arformoterol   15 mcg Nebulization BID   atorvastatin   40 mg Oral Daily   budesonide  (PULMICORT ) nebulizer solution  0.25 mg Nebulization BID   docusate sodium   100 mg Oral BID   folic acid   1 mg Oral Daily   multivitamin with minerals  1 tablet Oral Daily   nicotine   21 mg Transdermal Daily   senna-docusate  1 tablet Oral BID   thiamine   100 mg Oral Daily   Or   thiamine   100 mg Intravenous Daily   zonisamide   100 mg Oral q AM   And   zonisamide   200 mg Oral QHS      Subjective:   Oseph Imburgia was seen and examined today.  Alert and oriented, no acute complaints.  Plan for liver biopsy today.  Feeling better.   Objective:   Vitals:   01/12/24 1150 01/12/24 1200 01/12/24 1205 01/12/24 1210  BP: (!) 148/82 (!) 150/81 138/83 136/77  Pulse: 68 70 68 66  Resp: 18 20 18 16   Temp:      TempSrc:      SpO2: 100% 100% 100% 99%  Weight:      Height:        Intake/Output Summary (Last 24 hours) at  01/12/2024 1227 Last data filed at 01/12/2024 0655 Gross per 24 hour  Intake 2032.47 ml  Output 385 ml  Net 1647.47 ml     Wt Readings from Last 3 Encounters:  01/09/24 48 kg  12/29/22 48 kg   Physical Exam General: Alert and oriented x 3, NAD, cachectic Cardiovascular: S1 S2 clear, RRR.  Respiratory: Fairly CTAB Gastrointestinal: Soft, nontender, nondistended, NBS Ext: no pedal edema  bilaterally Neuro: no new deficits Psych: Normal affect      Data Reviewed:  I have personally reviewed following labs    CBC Lab Results  Component Value Date   WBC 9.1 01/12/2024   RBC 3.89 (L) 01/12/2024   HGB 11.4 (L) 01/12/2024   HCT 34.9 (L) 01/12/2024   MCV 89.7 01/12/2024   MCH 29.3 01/12/2024   PLT 145 (L) 01/12/2024   MCHC 32.7 01/12/2024   RDW 13.2 01/12/2024   LYMPHSABS 2.1 01/09/2024   MONOABS 1.1 (H) 01/09/2024   EOSABS 0.4 01/09/2024   BASOSABS 0.0 01/09/2024     Last metabolic panel Lab Results  Component Value Date   NA 135 01/12/2024   K 3.3 (L) 01/12/2024   CL 101 01/12/2024   CO2 23 01/12/2024   BUN 10 01/12/2024   CREATININE 1.18 01/12/2024   GLUCOSE 85 01/12/2024   GFRNONAA >60 01/12/2024   GFRAA >60 01/04/2011   CALCIUM  8.8 (L) 01/12/2024   PROT 6.5 01/12/2024   ALBUMIN 2.7 (L) 01/12/2024   BILITOT 0.4 01/12/2024   ALKPHOS 159 (H) 01/12/2024   AST 47 (H) 01/12/2024   ALT 25 01/12/2024   ANIONGAP 11 01/12/2024    CBG (last 3)  No results for input(s): GLUCAP in the last 72 hours.    Coagulation Profile: Recent Labs  Lab 01/09/24 1157 01/11/24 0451  INR 1.0 0.9     Radiology Studies: I have personally reviewed the imaging studies  VAS US  LOWER EXTREMITY VENOUS (DVT) Result Date: 01/11/2024  Lower Venous DVT Study Patient Name:  SAVAUGHN KARWOWSKI  Date of Exam:   01/11/2024 Medical Rec #: [id number]      Accession #:    [id number] Date of Birth: [birth year]     Patient Gender: M Patient Age:   14 years Exam [facility]  [facility]  Procedure:      VAS US  LOWER EXTREMITY VENOUS (DVT) Referring Phys: ARY XU --------------------------------------------------------------------------------  Indications: Stroke.  Limitations: Calcific shadowing. Comparison Study: No previous exams on file. Performing Technologist: Ezzie Potters RVT, RDMS  Examination Guidelines: A complete evaluation includes B-mode imaging, spectral Doppler, color Doppler, and power Doppler as needed of all accessible portions of each vessel. Bilateral testing is considered an integral part of a complete examination. Limited examinations for reoccurring indications may be performed as noted. The reflux portion of the exam is performed with the patient in reverse Trendelenburg.  +--------+---------------+---------+-----------+----------+--------------------+ RIGHT   CompressibilityPhasicitySpontaneityPropertiesThrombus Aging       +--------+---------------+---------+-----------+----------+--------------------+ CFV     Full           Yes      Yes                                       +--------+---------------+---------+-----------+----------+--------------------+ SFJ     Full                                                              +--------+---------------+---------+-----------+----------+--------------------+ FV Prox Full           Yes      Yes                                       +--------+---------------+---------+-----------+----------+--------------------+  FV Mid                 Yes      Yes                  patent by                                                                 color/doppler        +--------+---------------+---------+-----------+----------+--------------------+ FV      Full           Yes      Yes                                       Distal                                                                    +--------+---------------+---------+-----------+----------+--------------------+ PFV      Full                                                              +--------+---------------+---------+-----------+----------+--------------------+ POP     Full           Yes      Yes                                       +--------+---------------+---------+-----------+----------+--------------------+ PTV     Full                                                              +--------+---------------+---------+-----------+----------+--------------------+ PERO    Full                                                              +--------+---------------+---------+-----------+----------+--------------------+   +---------+---------------+---------+-----------+----------+--------------+ LEFT     CompressibilityPhasicitySpontaneityPropertiesThrombus Aging +---------+---------------+---------+-----------+----------+--------------+ CFV      Full           Yes      Yes                                 +---------+---------------+---------+-----------+----------+--------------+ SFJ      Full                                                        +---------+---------------+---------+-----------+----------+--------------+  FV Prox  Full           Yes      Yes                                 +---------+---------------+---------+-----------+----------+--------------+ FV Mid   Full           Yes      Yes                                 +---------+---------------+---------+-----------+----------+--------------+ FV DistalFull           Yes      Yes                                 +---------+---------------+---------+-----------+----------+--------------+ PFV      Full                                                        +---------+---------------+---------+-----------+----------+--------------+ POP      Full           Yes      Yes                                 +---------+---------------+---------+-----------+----------+--------------+ PTV      Full                                                         +---------+---------------+---------+-----------+----------+--------------+ PERO     Full                                                        +---------+---------------+---------+-----------+----------+--------------+     Summary: BILATERAL: - No evidence of deep vein thrombosis seen in the lower extremities, bilaterally. -No evidence of popliteal cyst, bilaterally.   *See table(s) above for measurements and observations. Electronically signed by Lonni Gaskins MD on 01/11/2024 at 5:24:10 PM.    Final    ECHOCARDIOGRAM COMPLETE Result Date: 01/11/2024    ECHOCARDIOGRAM REPORT   Patient Name:   JAHZIEL SINN Date of Exam: 01/11/2024 Medical Rec #:  [id number]     Height:       66.0 in Accession #:    [id number]    Weight:       105.8 lb Date of Birth:  1950/09/21    BSA:          1.526 m Patient Age:    72 years      BP:           142/76 mmHg Patient Gender: M             HR:           80 bpm. Exam Location:  Inpatient Procedure: 2D Echo, Cardiac Doppler and  Color Doppler (Both Spectral and Color            Flow Doppler were utilized during procedure). Indications:    Stroke  History:        Patient has no prior history of Echocardiogram examinations.  Sonographer:    Benard Stallion Referring Phys: [id number] Izora Benn K Mava Suares IMPRESSIONS  1. Left ventricular ejection fraction, by estimation, is 60 to 65%. The left ventricle has normal function. The left ventricle has no regional wall motion abnormalities. Left ventricular diastolic parameters are consistent with Grade I diastolic dysfunction (impaired relaxation).  2. Right ventricular systolic function is normal. The right ventricular size is normal.  3. The mitral valve is normal in structure. No evidence of mitral valve regurgitation. No evidence of mitral stenosis.  4. The aortic valve is tricuspid. There is mild calcification of the aortic valve. There is mild thickening of the aortic valve.  Aortic valve regurgitation is not visualized. Aortic valve sclerosis/calcification is present, without any evidence of aortic stenosis.  5. There is Moderate (Grade III) protruding plaque involving the descending aorta.  6. The inferior vena cava is normal in size with greater than 50% respiratory variability, suggesting right atrial pressure of 3 mmHg. FINDINGS  Left Ventricle: Left ventricular ejection fraction, by estimation, is 60 to 65%. The left ventricle has normal function. The left ventricle has no regional wall motion abnormalities. The left ventricular internal cavity size was normal in size. There is  no left ventricular hypertrophy. Left ventricular diastolic parameters are consistent with Grade I diastolic dysfunction (impaired relaxation). Right Ventricle: The right ventricular size is normal. No increase in right ventricular wall thickness. Right ventricular systolic function is normal. Left Atrium: Left atrial size was normal in size. Right Atrium: Right atrial size was normal in size. Pericardium: There is no evidence of pericardial effusion. Mitral Valve: The mitral valve is normal in structure. No evidence of mitral valve regurgitation. No evidence of mitral valve stenosis. Tricuspid Valve: The tricuspid valve is normal in structure. Tricuspid valve regurgitation is not demonstrated. No evidence of tricuspid stenosis. Aortic Valve: The aortic valve is tricuspid. There is mild calcification of the aortic valve. There is mild thickening of the aortic valve. Aortic valve regurgitation is not visualized. Aortic valve sclerosis/calcification is present, without any evidence of aortic stenosis. Aortic valve mean gradient measures 2.0 mmHg. Aortic valve peak gradient measures 3.7 mmHg. Aortic valve area, by VTI measures 2.45 cm. Pulmonic Valve: The pulmonic valve was normal in structure. Pulmonic valve regurgitation is not visualized. No evidence of pulmonic stenosis. Aorta: The aortic root is normal  in size and structure. There is moderate (Grade III) protruding plaque involving the descending aorta. Venous: The inferior vena cava is normal in size with greater than 50% respiratory variability, suggesting right atrial pressure of 3 mmHg. IAS/Shunts: No atrial level shunt detected by color flow Doppler.  LEFT VENTRICLE PLAX 2D LVIDd:         3.60 cm   Diastology LVIDs:         2.60 cm   LV e' medial:    2.83 cm/s LV PW:         0.80 cm   LV E/e' medial:  14.2 LV IVS:        0.80 cm   LV e' lateral:   3.15 cm/s LVOT diam:     2.20 cm   LV E/e' lateral: 12.8 LV SV:         43 LV SV Index:  28 LVOT Area:     3.80 cm  RIGHT VENTRICLE RV Basal diam:  2.80 cm RV Mid diam:    1.90 cm RV S prime:     15.80 cm/s LEFT ATRIUM           Index        RIGHT ATRIUM          Index LA Vol (A4C): 37.9 ml 24.84 ml/m  RA Area:     8.53 cm                                    RA Volume:   17.10 ml 11.21 ml/m  AORTIC VALVE AV Area (Vmax):    2.52 cm AV Area (Vmean):   2.43 cm AV Area (VTI):     2.45 cm AV Vmax:           96.40 cm/s AV Vmean:          65.200 cm/s AV VTI:            0.175 m AV Peak Grad:      3.7 mmHg AV Mean Grad:      2.0 mmHg LVOT Vmax:         63.80 cm/s LVOT Vmean:        41.600 cm/s LVOT VTI:          0.113 m LVOT/AV VTI ratio: 0.65  AORTA Ao Root diam: 2.90 cm MITRAL VALVE               TRICUSPID VALVE MV Area (PHT): 4.63 cm    TR Peak grad:   16.8 mmHg MV Decel Time: 164 msec    TR Vmax:        205.00 cm/s MR Peak grad: 49.8 mmHg MR Vmax:      353.00 cm/s  SHUNTS MV E velocity: 40.30 cm/s  Systemic VTI:  0.11 m MV A velocity: 67.70 cm/s  Systemic Diam: 2.20 cm MV E/A ratio:  0.60 Mihai Croitoru MD Electronically signed by Jerel Balding MD Signature Date/Time: 01/11/2024/2:24:13 PM    Final        Nydia Distance M.D. [facility] 01/12/2024, 12:27 PM  Available via Epic secure chat 7am-7pm After 7 pm, please refer to night coverage provider listed on amion.

## 2024-01-13 ENCOUNTER — Other Ambulatory Visit (HOSPITAL_COMMUNITY): Payer: Self-pay

## 2024-01-13 ENCOUNTER — Telehealth (HOSPITAL_COMMUNITY): Payer: Self-pay | Admitting: Pharmacy Technician

## 2024-01-13 DIAGNOSIS — R531 Weakness: Secondary | ICD-10-CM | POA: Diagnosis not present

## 2024-01-13 LAB — CBC
HCT: 34.4 % — ABNORMAL LOW (ref 39.0–52.0)
Hemoglobin: 11.4 g/dL — ABNORMAL LOW (ref 13.0–17.0)
MCH: 29.6 pg (ref 26.0–34.0)
MCHC: 33.1 g/dL (ref 30.0–36.0)
MCV: 89.4 fL (ref 80.0–100.0)
Platelets: 137 K/uL — ABNORMAL LOW (ref 150–400)
RBC: 3.85 MIL/uL — ABNORMAL LOW (ref 4.22–5.81)
RDW: 13.2 % (ref 11.5–15.5)
WBC: 11.6 K/uL — ABNORMAL HIGH (ref 4.0–10.5)
nRBC: 0 % (ref 0.0–0.2)

## 2024-01-13 LAB — COMPREHENSIVE METABOLIC PANEL WITH GFR
ALT: 28 U/L (ref 0–44)
AST: 54 U/L — ABNORMAL HIGH (ref 15–41)
Albumin: 2.9 g/dL — ABNORMAL LOW (ref 3.5–5.0)
Alkaline Phosphatase: 206 U/L — ABNORMAL HIGH (ref 38–126)
Anion gap: 11 (ref 5–15)
BUN: 8 mg/dL (ref 8–23)
CO2: 19 mmol/L — ABNORMAL LOW (ref 22–32)
Calcium: 9.2 mg/dL (ref 8.9–10.3)
Chloride: 107 mmol/L (ref 98–111)
Creatinine, Ser: 0.91 mg/dL (ref 0.61–1.24)
GFR, Estimated: >60 mL/min (ref >60)
Glucose, Bld: 89 mg/dL (ref 70–99)
Potassium: 4.1 mmol/L (ref 3.5–5.1)
Sodium: 137 mmol/L (ref 135–145)
Total Bilirubin: 0.5 mg/dL (ref 0.0–1.2)
Total Protein: 7 g/dL (ref 6.5–8.1)

## 2024-01-13 LAB — HEPARIN LEVEL (UNFRACTIONATED): Heparin Unfractionated: 0.45 [IU]/mL (ref 0.30–0.70)

## 2024-01-13 MED ORDER — APIXABAN 5 MG PO TABS
5.0000 mg | ORAL_TABLET | Freq: Two times a day (BID) | ORAL | Status: DC
  Administered 2024-01-13: 5 mg via ORAL
  Filled 2024-01-13: qty 1

## 2024-01-13 MED ORDER — ZONISAMIDE 100 MG PO CAPS
ORAL_CAPSULE | ORAL | 0 refills | Status: DC
  Filled 2024-01-13: qty 90, 30d supply, fill #0

## 2024-01-13 MED ORDER — APIXABAN 5 MG PO TABS
5.0000 mg | ORAL_TABLET | Freq: Two times a day (BID) | ORAL | 0 refills | Status: DC
  Filled 2024-01-13: qty 60, 30d supply, fill #0

## 2024-01-13 MED ORDER — SACCHAROMYCES BOULARDII 250 MG PO CAPS
250.0000 mg | ORAL_CAPSULE | Freq: Two times a day (BID) | ORAL | Status: DC
  Administered 2024-01-13: 250 mg via ORAL
  Filled 2024-01-13: qty 1

## 2024-01-13 MED ORDER — ATORVASTATIN CALCIUM 40 MG PO TABS
40.0000 mg | ORAL_TABLET | Freq: Every day | ORAL | 0 refills | Status: DC
  Filled 2024-01-13: qty 90, 90d supply, fill #0

## 2024-01-13 NOTE — Telephone Encounter (Signed)
 Patient Product/process development scientist completed.    The patient is insured through [place]. Patient has Medicare and is not eligible for a copay card, but may be able to apply for patient assistance or Medicare RX Payment Plan (Patient Must reach out to their plan, if eligible for payment plan), if available.    Ran test claim for Eliquis  5 mg and the current 30 day co-pay is $446.47 due to a $450.00 deductible.  Will be $47.00 once deductible is met.   This test claim was processed through [facility]- copay amounts may vary at other pharmacies due to pharmacy/plan contracts, or as the patient moves through the different stages of their insurance plan.     Reyes Sharps, CPHT Pharmacy Technician III Certified Patient Advocate [facility] Patient Advocate Team Direct Number: [phone]  Fax: [phone]

## 2024-01-13 NOTE — Discharge Summary (Signed)
 Physician Discharge Summary   Patient: Lonnie Blair MRN: [id number] DOB: Dec 04, 1950  Admit date:     01/09/2024  Discharge date: 01/13/24  Discharge Physician: Lonni SHAUNNA Dalton   PCP: [name], FNP     Recommendations at discharge:  Follow up with PCP Nell Lenon for suspected metastatic cancer and stroke Follow up with Neurology Dr. Tonuzi for stroke, seizure in 6-8 weeks Follow up with Oncology when liver biopsy results     Discharge Diagnoses: Principal Problem:   Acute embolic stroke Active Problems:   Liver mass   Lung mass   Suspected pancreas mass   Seizure disorder (HCC)   Chronic obstructive pulmonary disease with exacerbation   Essential hypertension   Smoker   COVID-19 virus infection   Protein-calorie malnutrition, severe     Hospital Course: 73 y.o. M with hx seizures, COPD, pancreatitis, alcohol use, smoking who presented with cough, confusion, generalized weakness and falls.  In the ER, found to have COVID.  Also found to have multiple embolic appearing strokes.  Also found to have new lung and liver masses.    Multifocal embolic stroke Presented with left-sided weakness, 2 new seizures.  MRI brain showed numerous small embolic appearing acute infarcts scattered in the bilateral anterior and posterior circulation  Seen by neurology and started on IV heparin .  CT angiogram of the head and neck showed right ICA occlusion, small volume adherent thrombus in the right ICA origin, and reconstitution of the right MCA and ACA.  Echocardiogram showed normal EF, no cardiogenic source, but grade 3 aortic atheroma.  Lower extremity Doppler showed no DVT.  LDL 92, started on Lipitor 40 mg daily.  Hemoglobin A1c normal.  Patient treated with IV heparin , transition to Eliquis  for discharge.  PT recommend outpatient therapy.  Neurology recommend outpatient follow-up with Dr. Tonuzi.    Seizures, breakthrough Received 1 dose of Keppra  in  the ER, EEG showed cortical dysfunction from the right hemisphere likely due to the underlying structural abnormality, and so his zonisamide  was increased. - Follow-up with Dr. Tonuzi.       Suspected metastatic disease with pancreatic mass, lung mass, liver lesions Patient underwent liver biopsy, results pending.  Oncology, Dr. Gatha was consulted, recommended follow-up with oncology after biopsy results.  Navigator following.   COVID 19 viral infection Resolving.  Outside this range of utility for antivirals.   COPD exacerbation Treated with antibiotics, nebulizers.  Breathing at baseline at the time of discharge.  Constipation Diarrhea Constipated earlier in admission.  Treated with senna, docusate.  Then developed diarrhea.  Likely antibiotic associated + senna.  Stop senna.  Monitor outpatient.      Protein calorie malnutrition, hypoalbuminemia          The [place]  Controlled Substances Registry was reviewed for this patient prior to discharge.  Consultants: Neurology Interventional radiology Oncology  Procedures performed:  Liver biopsy  Disposition: Home Diet recommendation:  Cardiac diet  DISCHARGE MEDICATION: Allergies as of 01/13/2024       Reactions   Tylenol  [acetaminophen ] Itching        Medication List     TAKE these medications    albuterol  108 (90 Base) MCG/ACT inhaler Commonly known as: VENTOLIN  HFA Inhale 1-2 puffs into the lungs every 6 (six) hours as needed for wheezing or shortness of breath.   apixaban  5 MG Tabs tablet Commonly known as: ELIQUIS  Take 1 tablet (5 mg total) by mouth 2 (two) times daily.   atorvastatin  40 MG tablet  Commonly known as: LIPITOR Take 1 tablet (40 mg total) by mouth daily. Start taking on: January 14, 2024   ipratropium-albuterol  0.5-2.5 (3) MG/3ML Soln Commonly known as: DUONEB Take 3 mLs by nebulization every 6 (six) hours as needed.   lactose free nutrition Liqd Take 237 mLs by mouth once a  week. Drink 237 ml's of Strawberry Boost Plus   NICOTINE  TD Place 1 patch onto the skin daily.   zonisamide  100 MG capsule Commonly known as: ZONEGRAN  Take zonisamide  100 mg (1 tab) in the morning and 200 mg (2 tabs) at night What changed:  how much to take how to take this when to take this additional instructions        Follow-up Information     Tonuzi, Sharri, MD. Schedule an appointment as soon as possible for a visit in 1 month(s).   Specialty: Neurology Contact information: [address] [place] [address] [phone]         [name], FNP. Schedule an appointment as soon as possible for a visit in 1 week(s).   Specialty: Nurse Practitioner Contact information: [address] [place] [address] [phone]         [facility] Follow up.   Why: [phone] They will reach out to you for an appointment, call in 1 week if you haven't heard from them        [facility] Follow up.   Why: [facility] will contact you for the first appointment Contact information: [address] [place] [address] [phone]                 Discharge Instructions     Ambulatory referral to Hematology / Oncology   Complete by: As directed    Seen by Dr. Sherrod in the hospital Biopsy pending.  If GI primary, recommend Dr. Lanny or Dr. Cloretta If lung primary, follow up with Dr. Sherrod   Ambulatory referral to Neurology   Complete by: As directed    Follow up within 4 weeks   Ambulatory referral to Occupational Therapy   Complete by: As directed    Ambulatory referral to Physical Therapy   Complete by: As directed    Discharge instructions   Complete by: As directed    **IMPORTANT DISCHARGE INSTRUCTIONS**   From Dr. Jonel: You were admitted for weakness.  Here, we found that you had had a small stroke.   More concerning, we found that  you had some new lesions in your liver and lungs.  We aren't sure what these are, but we are concerned they are cancer.  Possibly pancreatic cancer, spread to the lungs and liver.  You had a biopsy of one of hte liver lesions.  You MUST follow up with the cancer center for results.  See below in the To Do section.  The cancer navigator team should reach out to you in the next week to arrange follow up.  If they haven't called you in 7-10 days, make sure to call the number listed and ask.  Let them know you saw Dr. Sherrod in the hospital, and were awaiting a follow up appointment.  For the stroke: You were seen by a brain specialist (a neurologist) They suspect the stroke was from clot material that spread to the brain.  TO prevent another stroke: Take the blood thinner apixaban /Eliquis  5 mg twice daily Take the cholesterol  medicine Lipitor/atorvastatin  40 mg daily  In addition, we are concenred the stroke caused your seizures to worsen, so we recommend INCREASE your zonisamide  (1 tab in the morning but TWO tabs at night)  Continue your other home medicines, go see your primary doctor in 1 week   Increase activity slowly   Complete by: As directed    No wound care   Complete by: As directed        Discharge Exam: Filed Weights   01/09/24 1136  Weight: 48 kg    General: Pt is alert, awake, not in acute distress, lying in bed, oriented to situation Cardiovascular: RRR, nl S1-S2, no murmurs appreciated.   No LE edema.   Respiratory: Normal respiratory rate and rhythm.  Lungs coarse and rattling cough but no rales or wheezing Abdominal: Abdomen soft and non-tender.  No distension or HSM.   Neuro/Psych: Strength symmetric in upper and lower extremities but generally weak.  Judgment and insight appear normal.   Condition at discharge: poor  The results of significant diagnostics from this hospitalization (including imaging, microbiology, ancillary and laboratory) are listed below  for reference.   Imaging Studies: US  BIOPSY (LIVER) Result Date: 01/12/2024 INDICATION: Liver metastases EXAM: ULTRASOUND LEFT LIVER METASTASIS 18 GAUGE CORE BIOPSY MEDICATIONS: 1% LIDOCAINE  LOCAL ANESTHESIA/SEDATION: Moderate (conscious) sedation was employed during this procedure. A total of Versed  1.0 mg and Fentanyl  25 mcg was administered intravenously by the radiology nurse. Total intra-service moderate Sedation Time: 8 minutes. The patient's level of consciousness and vital signs were monitored continuously by radiology nursing throughout the procedure under my direct supervision. COMPLICATIONS: None immediate. PROCEDURE: Informed written consent was obtained from the patient after a thorough discussion of the procedural risks, benefits and alternatives. All questions were addressed. Maximal Sterile Barrier Technique was utilized including caps, mask, sterile gowns, sterile gloves, sterile drape, hand hygiene and skin antiseptic. A timeout was performed prior to the initiation of the procedure. previous imaging reviewed. preliminary ultrasound performed. a hypoechoic liver lesion in the lateral segment left liver was localized and marked. under sterile conditions and local anesthesia, the 17 gauge 6.8 cm guide needle was advanced from an anterior oblique approach into the left liver metastasis. needle position confirmed with ultrasound. two intact 18 gauge core biopsies obtained. samples placed in formalin. needle tract occluded with gel-foam. no immediate complication. patient tolerated the procedure well. IMPRESSION: Successful ultrasound left liver metastasis 18 gauge core biopsy Electronically Signed   By: CHRISTELLA.  Shick M.D.   On: 01/12/2024 14:43   VAS US  LOWER EXTREMITY VENOUS (DVT) Result Date: 01/11/2024  Lower Venous DVT Study Patient Name:  SHIVANSH HARDAWAY  Date of Exam:   01/11/2024 Medical Rec #: [id number]      Accession #:    [id number] Date of Birth: [birth year]     Patient Gender: M Patient Age:    89 years Exam Location:  [facility] Procedure:      VAS US  LOWER EXTREMITY VENOUS (DVT) Referring Phys: ARY XU --------------------------------------------------------------------------------  Indications: Stroke.  Limitations: Calcific shadowing. Comparison Study: No previous exams on file. Performing Technologist: Ezzie Potters RVT, RDMS  Examination Guidelines: A complete evaluation includes B-mode imaging, spectral Doppler, color Doppler, and power Doppler as needed of all accessible portions of each vessel. Bilateral testing is considered an integral part of a complete examination. Limited examinations for reoccurring indications may be performed as noted. The reflux portion of the exam is performed with the patient in reverse Trendelenburg.  +--------+---------------+---------+-----------+----------+--------------------+ RIGHT  CompressibilityPhasicitySpontaneityPropertiesThrombus Aging       +--------+---------------+---------+-----------+----------+--------------------+ CFV     Full           Yes      Yes                                       +--------+---------------+---------+-----------+----------+--------------------+ SFJ     Full                                                              +--------+---------------+---------+-----------+----------+--------------------+ FV Prox Full           Yes      Yes                                       +--------+---------------+---------+-----------+----------+--------------------+ FV Mid                 Yes      Yes                  patent by                                                                 color/doppler        +--------+---------------+---------+-----------+----------+--------------------+ FV      Full           Yes      Yes                                       Distal                                                                     +--------+---------------+---------+-----------+----------+--------------------+ PFV     Full                                                              +--------+---------------+---------+-----------+----------+--------------------+ POP     Full           Yes      Yes                                       +--------+---------------+---------+-----------+----------+--------------------+ PTV     Full                                                              +--------+---------------+---------+-----------+----------+--------------------+  PERO    Full                                                              +--------+---------------+---------+-----------+----------+--------------------+   +---------+---------------+---------+-----------+----------+--------------+ LEFT     CompressibilityPhasicitySpontaneityPropertiesThrombus Aging +---------+---------------+---------+-----------+----------+--------------+ CFV      Full           Yes      Yes                                 +---------+---------------+---------+-----------+----------+--------------+ SFJ      Full                                                        +---------+---------------+---------+-----------+----------+--------------+ FV Prox  Full           Yes      Yes                                 +---------+---------------+---------+-----------+----------+--------------+ FV Mid   Full           Yes      Yes                                 +---------+---------------+---------+-----------+----------+--------------+ FV DistalFull           Yes      Yes                                 +---------+---------------+---------+-----------+----------+--------------+ PFV      Full                                                        +---------+---------------+---------+-----------+----------+--------------+ POP      Full           Yes      Yes                                  +---------+---------------+---------+-----------+----------+--------------+ PTV      Full                                                        +---------+---------------+---------+-----------+----------+--------------+ PERO     Full                                                        +---------+---------------+---------+-----------+----------+--------------+  Summary: BILATERAL: - No evidence of deep vein thrombosis seen in the lower extremities, bilaterally. -No evidence of popliteal cyst, bilaterally.   *See table(s) above for measurements and observations. Electronically signed by Lonni Gaskins MD on 01/11/2024 at 5:24:10 PM.    Final    ECHOCARDIOGRAM COMPLETE Result Date: 01/11/2024    ECHOCARDIOGRAM REPORT   Patient Name:   DVONTAE RUAN Date of Exam: 01/11/2024 Medical Rec #:  [id number]     Height:       66.0 in Accession #:    [id number]    Weight:       105.8 lb Date of Birth:  02/13/1951    BSA:          1.526 m Patient Age:    72 years      BP:           142/76 mmHg Patient Gender: M             HR:           80 bpm. Exam Location:  Inpatient Procedure: 2D Echo, Cardiac Doppler and Color Doppler (Both Spectral and Color            Flow Doppler were utilized during procedure). Indications:    Stroke  History:        Patient has no prior history of Echocardiogram examinations.  Sonographer:    Benard Stallion Referring Phys: [id number] RIPUDEEP K RAI IMPRESSIONS  1. Left ventricular ejection fraction, by estimation, is 60 to 65%. The left ventricle has normal function. The left ventricle has no regional wall motion abnormalities. Left ventricular diastolic parameters are consistent with Grade I diastolic dysfunction (impaired relaxation).  2. Right ventricular systolic function is normal. The right ventricular size is normal.  3. The mitral valve is normal in structure. No evidence of mitral valve regurgitation. No evidence of mitral stenosis.  4. The aortic valve is tricuspid. There  is mild calcification of the aortic valve. There is mild thickening of the aortic valve. Aortic valve regurgitation is not visualized. Aortic valve sclerosis/calcification is present, without any evidence of aortic stenosis.  5. There is Moderate (Grade III) protruding plaque involving the descending aorta.  6. The inferior vena cava is normal in size with greater than 50% respiratory variability, suggesting right atrial pressure of 3 mmHg. FINDINGS  Left Ventricle: Left ventricular ejection fraction, by estimation, is 60 to 65%. The left ventricle has normal function. The left ventricle has no regional wall motion abnormalities. The left ventricular internal cavity size was normal in size. There is  no left ventricular hypertrophy. Left ventricular diastolic parameters are consistent with Grade I diastolic dysfunction (impaired relaxation). Right Ventricle: The right ventricular size is normal. No increase in right ventricular wall thickness. Right ventricular systolic function is normal. Left Atrium: Left atrial size was normal in size. Right Atrium: Right atrial size was normal in size. Pericardium: There is no evidence of pericardial effusion. Mitral Valve: The mitral valve is normal in structure. No evidence of mitral valve regurgitation. No evidence of mitral valve stenosis. Tricuspid Valve: The tricuspid valve is normal in structure. Tricuspid valve regurgitation is not demonstrated. No evidence of tricuspid stenosis. Aortic Valve: The aortic valve is tricuspid. There is mild calcification of the aortic valve. There is mild thickening of the aortic valve. Aortic valve regurgitation is not visualized. Aortic valve sclerosis/calcification is present, without any evidence of aortic stenosis. Aortic valve mean gradient measures 2.0 mmHg. Aortic valve peak gradient measures 3.7 mmHg.  Aortic valve area, by VTI measures 2.45 cm. Pulmonic Valve: The pulmonic valve was normal in structure. Pulmonic valve  regurgitation is not visualized. No evidence of pulmonic stenosis. Aorta: The aortic root is normal in size and structure. There is moderate (Grade III) protruding plaque involving the descending aorta. Venous: The inferior vena cava is normal in size with greater than 50% respiratory variability, suggesting right atrial pressure of 3 mmHg. IAS/Shunts: No atrial level shunt detected by color flow Doppler.  LEFT VENTRICLE PLAX 2D LVIDd:         3.60 cm   Diastology LVIDs:         2.60 cm   LV e' medial:    2.83 cm/s LV PW:         0.80 cm   LV E/e' medial:  14.2 LV IVS:        0.80 cm   LV e' lateral:   3.15 cm/s LVOT diam:     2.20 cm   LV E/e' lateral: 12.8 LV SV:         43 LV SV Index:   28 LVOT Area:     3.80 cm  RIGHT VENTRICLE RV Basal diam:  2.80 cm RV Mid diam:    1.90 cm RV S prime:     15.80 cm/s LEFT ATRIUM           Index        RIGHT ATRIUM          Index LA Vol (A4C): 37.9 ml 24.84 ml/m  RA Area:     8.53 cm                                    RA Volume:   17.10 ml 11.21 ml/m  AORTIC VALVE AV Area (Vmax):    2.52 cm AV Area (Vmean):   2.43 cm AV Area (VTI):     2.45 cm AV Vmax:           96.40 cm/s AV Vmean:          65.200 cm/s AV VTI:            0.175 m AV Peak Grad:      3.7 mmHg AV Mean Grad:      2.0 mmHg LVOT Vmax:         63.80 cm/s LVOT Vmean:        41.600 cm/s LVOT VTI:          0.113 m LVOT/AV VTI ratio: 0.65  AORTA Ao Root diam: 2.90 cm MITRAL VALVE               TRICUSPID VALVE MV Area (PHT): 4.63 cm    TR Peak grad:   16.8 mmHg MV Decel Time: 164 msec    TR Vmax:        205.00 cm/s MR Peak grad: 49.8 mmHg MR Vmax:      353.00 cm/s  SHUNTS MV E velocity: 40.30 cm/s  Systemic VTI:  0.11 m MV A velocity: 67.70 cm/s  Systemic Diam: 2.20 cm MV E/A ratio:  0.60 Mihai Croitoru MD Electronically signed by Jerel Balding MD Signature Date/Time: 01/11/2024/2:24:13 PM    Final    CT ANGIO HEAD NECK W WO CM Addendum Date: 01/10/2024 ADDENDUM REPORT: 01/10/2024 12:03 ADDENDUM: Study discussed  by telephone with Dr. Jerri on 01/10/2024 at 1156 hours. Electronically Signed   By:  VEAR Hurst M.D.   On: 01/10/2024 12:03   Result Date: 01/10/2024 CLINICAL DATA:  73 year old male with seizures. Metastatic pancreatic cancer. Numerous small embolic appearing infarcts on brain MRI this morning, and evidence of right ICA poor flow or occlusion on that exam. EXAM: CT ANGIOGRAPHY HEAD AND NECK WITH AND WITHOUT CONTRAST TECHNIQUE: Multidetector CT imaging of the head and neck was performed using the standard protocol during bolus administration of intravenous contrast. Multiplanar CT image reconstructions and MIPs were obtained to evaluate the vascular anatomy. Carotid stenosis measurements (when applicable) are obtained utilizing NASCET criteria, using the distal internal carotid diameter as the denominator. RADIATION DOSE REDUCTION: This exam was performed according to the departmental dose-optimization program which includes automated exposure control, adjustment of the mA and/or kV according to patient size and/or use of iterative reconstruction technique. CONTRAST:  75mL OMNIPAQUE  IOHEXOL  350 MG/ML SOLN COMPARISON:  Brain MRI 0413 hours today.  Head CT yesterday. CTA chest yesterday. FINDINGS: CT HEAD Brain: Numerous small scattered acute infarcts are mostly occult by CT. No acute intracranial hemorrhage identified. No midline shift, mass effect, or evidence of intracranial mass lesion. No ventriculomegaly. Calvarium and skull base: Intact. No acute or suspicious osseous lesion. Paranasal sinuses: Visualized paranasal sinuses and mastoids are stable and well aerated. Orbits: No acute orbit or scalp soft tissue finding. CTA NECK Skeleton: Carious dentition. No acute or suspicious osseous lesion identified in the neck. Partially visible mild thoracic scoliosis. Upper chest: Moderate to severe emphysema. Apical lung scarring. Spiculated anterior left upper lobe lung nodule measuring 7 mm on series 11, image 163. And a  larger spiculated subpleural left upper lobe lung nodule on this exam is mostly obscured by dense left subclavian venous contrast streak artifact on series 11, image 48. See Chest CTA reported yesterday. Mediastinal lymph nodes within normal limits. Visible central pulmonary arteries appear patent. Other neck: Nonvascular neck soft tissue spaces are within normal limits. Aortic arch: Calcified aortic atherosclerosis.  3 vessel arch. Right carotid system: Patent brachiocephalic artery and right CCA with minimal plaque and no stenosis. Right ICA is gradually occluded beginning at the bulb, with evidence of stringy thrombus within the ICA origin on series 11, image 113. No reconstitution at the skull base. Left carotid system: Patent left CCA origin. No significant left CCA plaque or stenosis. Patent left carotid bifurcation with minor irregularity. Patent left ICA to the skull base without stenosis. Vertebral arteries: Right subclavian origin calcified plaque without stenosis. Normal right vertebral artery origin. Right vertebral artery is patent and normal to the skull base. Proximal left subclavian artery soft and calcified plaque without stenosis. Mild calcified plaque at the left vertebral artery origin without stenosis. Mildly non dominant left vertebral artery is patent and normal to the skull base. CTA HEAD Posterior circulation: Patent codominant distal vertebral arteries, somewhat diminutive along with the vertebrobasilar junction. But patent without plaque or stenosis identified. PICA origins are patent. Patent and tortuous basilar artery, diminutive but without stenosis. Patent SCA origins. Fetal type bilateral PCA origins. Left PCA branches are within normal limits. Right posterior communicating artery is hypoenhancing anteriorly near the junction with the abnormal right ICA terminus (series 16, image 39). But otherwise the right MCA branches are within normal limits. Anterior circulation: No  reconstitution of the right ICA siphon which is atherosclerotic. Reconstituted right MCA and ACA origins with mild irregularity there on series 15, image 39. Anterior communicating artery is present and bilateral A1 enhancement is symmetric. Bilateral ACA branches appear patent and  within normal limits. Left ICA siphon is patent with calcified plaque, but no significant left siphon stenosis. Normal left posterior communicating artery origin. The on the right MCA origin irregularity the right M1 segment is enhancing symmetrically, within normal limits. Bilateral MCA M1 segments and MCA bifurcations appear patent without stenosis. No MCA branch occlusion is identified on either side. Venous sinuses: Early contrast timing, not well evaluated. Anatomic variants: Fetal type bilateral PCA origins. Review of the MIP images confirms the above findings IMPRESSION: 1. 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 No reconstitution through the right ICA supraclinoid segment. 2. Right MCA and ACA origins are reconstituted with mild irregularity there. But otherwise symmetric anterior circulation enhancement and no anterior circulation branch occlusion identified. 3. Patent but diminutive posterior circulation appears to be on the basis of fetal type PCA origins. No significant posterior circulation stenosis. 4. Numerous small embolic appearing infarcts on brain MRI this morning are largely occult by CT. No hemorrhagic transformation or intracranial mass effect. 5. Spiculated left upper lobe lung nodules, see Chest CTA reported yesterday. Aortic Atherosclerosis (ICD10-I70.0) and Emphysema (ICD10-J43.9). Electronically Signed: By: VEAR Hurst M.D. On: 01/10/2024 11:55   EEG adult Result Date: 01/10/2024 Shelton Arlin KIDD, MD     01/10/2024  5:53 AM Patient Name: Lonnie Blair MRN: [id number] Epilepsy Attending: Arlin KIDD Shelton Referring Physician/Provider: [name], MD Date: 01/09/2024 Duration: 25.09 mins Patient history: 73yo M with left sided weakness and seizure. EEG to evaluate for seizure. Level of alertness: Awake AEDs during EEG study: None Technical aspects: This EEG study was done with scalp electrodes positioned according to the 10-20 International system of electrode placement. Electrical activity was reviewed with band pass filter of 1-70Hz , sensitivity of 7 uV/mm, display speed of 1mm/sec with a 60Hz  notched filter applied as appropriate. EEG data were recorded continuously and digitally stored.  Video monitoring was available and reviewed as appropriate. Description: The posterior dominant rhythm consists of 7.5 Hz activity of moderate voltage (25-35 uV) seen predominantly in posterior head regions, symmetric and reactive to eye opening and eye closing. EEG showed intermittent generalized and lateralized right hemisphere 3 to 6 Hz theta-delta slowing. Hyperventilation and photic stimulation were not performed.   ABNORMALITY - Intermittent slow, generalized and lateralized right hemisphere IMPRESSION: This study is suggestive of cortical dysfunction arising from right hemisphere likely secondary to underlying structural abnormality, post-ictal state. Additionally there is mild to moderate diffuse encephalopathy. No seizures or epileptiform discharges were seen throughout the recording. Arlin KIDD Shelton   MR BRAIN W WO CONTRAST Addendum Date: 01/10/2024 ADDENDUM REPORT: 01/10/2024 05:12 ADDENDUM: Study discussed by telephone with PA A. Chavez on 01/10/2024 at 0505 hours. Electronically Signed   By: VEAR Hurst M.D.   On: 01/10/2024 05:12   Result Date: 01/10/2024 CLINICAL DATA:  73 year old male with seizures. Metastatic pancreatic cancer. EXAM: MRI HEAD WITHOUT AND WITH CONTRAST TECHNIQUE: Multiplanar, multiecho pulse sequences of the brain and surrounding structures were obtained without and with intravenous  contrast. CONTRAST:  5mL GADAVIST  GADOBUTROL  1 MMOL/ML IV SOLN COMPARISON:  Head CT yesterday. FINDINGS: Brain: Widely scattered small infarcts in the bilateral cerebellar and right greater than left cerebral hemispheres. Anterior and posterior circulation affected, including the occipital poles. In the right hemisphere there is more conspicuous watershed pattern ischemia on series 5, image 81. The deep gray nuclei and brainstem are spared. T2 and FLAIR  hyperintense cytotoxic edema in the affected areas. No acute or chronic intracranial hemorrhage identified. No abnormal enhancement identified. No dural thickening. No midline shift, mass effect, or evidence of intracranial mass lesion. No ventriculomegaly, extra-axial collection. Cervicomedullary junction and pituitary are within normal limits. And outside of the acute findings, the background gray and white matter signal is fairly normal for age. Vascular: Right ICA flow void is absent throughout the upper neck and siphon, with evidence of reconstituted flow at the right ICA terminus. Other Major intracranial vascular flow voids are preserved. Skull and upper cervical spine: Visualized bone marrow signal is within normal limits. Negative visible cervical spine for age. Sinuses/Orbits: Negative. Other: Mastoids are clear. Visible internal auditory structures appear normal. Negative visible scalp and face. IMPRESSION: 1. Numerous small Embolic appearing acute infarcts are widely scattered in the bilateral anterior and posterior circulation. No hemorrhagic transformation or mass effect. 2. Right ICA Poor flow or Occlusion in the neck and at the skull base, with evidence of reconstituted right ICA terminus. This is age indeterminate, but most likely nonacute given the infarct pattern in #1. 3.  No metastatic disease identified. Electronically Signed: By: VEAR Hurst M.D. On: 01/10/2024 04:52   CT Angio Chest PE W and/or Wo Contrast Result Date: 01/09/2024 CLINICAL DATA:   Unilateral left-sided weakness beginning over 1 week ago. Unable to move left arm or leg. Also with cough. Patient also with abdominal pain. EXAM: CT ANGIOGRAPHY CHEST CT ABDOMEN AND PELVIS WITH CONTRAST TECHNIQUE: Multidetector CT imaging of the chest was performed using the standard protocol during bolus administration of intravenous contrast. Multiplanar CT image reconstructions and MIPs were obtained to evaluate the vascular anatomy. Multidetector CT imaging of the abdomen and pelvis was performed using the standard protocol during bolus administration of intravenous contrast. RADIATION DOSE REDUCTION: This exam was performed according to the departmental dose-optimization program which includes automated exposure control, adjustment of the mA and/or kV according to patient size and/or use of iterative reconstruction technique. CONTRAST:  75mL OMNIPAQUE  IOHEXOL  350 MG/ML SOLN COMPARISON:  01/01/2023. FINDINGS: CTA CHEST FINDINGS Cardiovascular: Pulmonary arteries are well opacified. There is no evidence of a pulmonary embolism. Heart is normal in size. No pericardial effusion. Thoracic aorta is normal in caliber. There is aortic atherosclerosis, but no dissection. Mediastinum/Nodes: No neck base, mediastinal or hilar masses. No enlarged lymph nodes. Trachea esophagus are unremarkable. Lungs/Pleura: There is a spiculated nodule that abuts the overlying fluoro, associated with pleural thickening. Nodule measures 11 mm in greatest axial dimension and 1.4 cm in greatest dimension on the coronal sequence. This nodule has increased in size from the prior CT. And is centered on image 19, series 7. Small spiculated nodule, anterior left upper lobe, image 34, series 7, 7 x 4 mm, mean 5.5 mm, increased in size from the prior CT. 5 mm nodule, superior segment of the left lower lobe, image 28, series 7, abuts the oblique fissure. There is adjacent nodular pleural thickening of the upper aspect of the left oblique fissure.  This is similar to the prior CT. Advanced centrilobular emphysema. Stable air cyst, right lower lobe. No lung consolidation or evidence of edema. No pleural effusion or pneumothorax. Musculoskeletal: No fracture or acute finding. No bone lesion. No chest wall mass. Review of the MIP images confirms the above findings. CT ABDOMEN and PELVIS FINDINGS Hepatobiliary: Liver normal in size and overall attenuation. There are numerous small hypoattenuating masses, largest at the dome of the right lobe, 1.3 cm. Findings are consistent with  metastatic disease. Gallbladder is unremarkable. No bile duct dilation. Pancreas: Pancreatic atrophy and irregular duct dilation. Both findings have progressed since the prior CT. Duct with a maximum diameter of 8 mm. Calcifications lie adjacent to the distal pancreatic and common bile duct in the pancreatic head new since the prior CT. No defined pancreatic mass. There is ill-defined soft tissue adjacent to the common bile duct along the porta hepatis. No evidence of pancreatic inflammation. Spleen: Normal in size without focal abnormality. Adrenals/Urinary Tract: No adrenal mass. Kidneys normal in overall size and position with symmetric enhancement. There are wedge-shaped areas of hypoattenuation, anterolateral left kidney, mid to lower pole and posterior upper pole the right kidney, new since the prior CT, consistent with infarcts. No defined renal mass. No hydronephrosis. Ureters are grossly normal in course and in caliber. Bladder is unremarkable. Stomach/Bowel: Stomach is mostly decompressed, otherwise unremarkable. Grossly normal appearance of the small bowel. Right colon is mildly distended up to 5 cm. There is no wall thickening or inflammation. Vascular/Lymphatic: Prominent and mildly enlarged shoddy lymph nodes in the upper abdomen, gastrohepatic ligament, Peri celiac and aortocaval. Gastrohepatic ligament node measures largest, 1.4 cm. Nodes have increased when compared to the  prior CT. Dense aortoiliac atherosclerotic calcification. No aneurysm. Reproductive: Mild stable prostate enlargement. Other: No abdominal wall hernia.  No ascites. Musculoskeletal: No fracture or acute finding. No osteoblastic or osteolytic lesions. Review of the MIP images confirms the above findings. IMPRESSION: CTA CHEST 1. No evidence of a pulmonary embolism. 2. No acute findings. 3. 2 left upper lobe pulmonary nodules, both spiculated appearance and suspicious, largest abutting the anterior pleural surface measuring 1.4 cm in greatest dimension. Both nodules have increased in size from the previous CT and are concerning for primary lung carcinoma. 4. Advanced emphysema.  Aortic atherosclerosis. CT ABDOMEN AND PELVIS 1. No acute findings. 2. Multiple low-attenuation liver masses consistent with metastatic disease, new from the prior CT. There are also enlarged lymph nodes in the upper abdomen, largest along the gastrohepatic ligament, consistent with metastatic disease. History provided for the current head CT reports pancreatic cancer. 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 3. No defined pancreatic mass, although there is irregular dilation of the pancreatic duct that has increased when compared to the prior CT. 4. Dense aortic atherosclerosis. 5. Wedge-shaped hypoattenuating areas in both kidneys, new since the prior CT, suspected to be infarcts. Electronically Signed   By: Alm Parkins M.D.   On: 01/09/2024 14:53   CT ABDOMEN PELVIS W CONTRAST Result Date: 01/09/2024 CLINICAL DATA:  Unilateral left-sided weakness beginning over 1 week ago. Unable to move left arm or leg. Also with cough. Patient also with abdominal pain. EXAM: CT ANGIOGRAPHY CHEST CT ABDOMEN AND PELVIS WITH CONTRAST TECHNIQUE: Multidetector CT imaging of the chest was performed using the standard protocol during bolus administration of intravenous contrast. Multiplanar CT image reconstructions and MIPs were  obtained to evaluate the vascular anatomy. Multidetector CT imaging of the abdomen and pelvis was performed using the standard protocol during bolus administration of intravenous contrast. RADIATION DOSE REDUCTION: This exam was performed according to the departmental dose-optimization program which includes automated exposure control, adjustment of the mA and/or kV according to patient size and/or use of iterative reconstruction technique. CONTRAST:  75mL OMNIPAQUE  IOHEXOL  350 MG/ML SOLN COMPARISON:  01/01/2023. FINDINGS: CTA CHEST FINDINGS Cardiovascular: Pulmonary arteries are well opacified. There is no evidence of a pulmonary embolism. Heart is normal in size. No pericardial effusion. Thoracic aorta is normal in caliber.  There is aortic atherosclerosis, but no dissection. Mediastinum/Nodes: No neck base, mediastinal or hilar masses. No enlarged lymph nodes. Trachea esophagus are unremarkable. Lungs/Pleura: There is a spiculated nodule that abuts the overlying fluoro, associated with pleural thickening. Nodule measures 11 mm in greatest axial dimension and 1.4 cm in greatest dimension on the coronal sequence. This nodule has increased in size from the prior CT. And is centered on image 19, series 7. Small spiculated nodule, anterior left upper lobe, image 34, series 7, 7 x 4 mm, mean 5.5 mm, increased in size from the prior CT. 5 mm nodule, superior segment of the left lower lobe, image 28, series 7, abuts the oblique fissure. There is adjacent nodular pleural thickening of the upper aspect of the left oblique fissure. This is similar to the prior CT. Advanced centrilobular emphysema. Stable air cyst, right lower lobe. No lung consolidation or evidence of edema. No pleural effusion or pneumothorax. Musculoskeletal: No fracture or acute finding. No bone lesion. No chest wall mass. Review of the MIP images confirms the above findings. CT ABDOMEN and PELVIS FINDINGS Hepatobiliary: Liver normal in size and overall  attenuation. There are numerous small hypoattenuating masses, largest at the dome of the right lobe, 1.3 cm. Findings are consistent with metastatic disease. Gallbladder is unremarkable. No bile duct dilation. Pancreas: Pancreatic atrophy and irregular duct dilation. Both findings have progressed since the prior CT. Duct with a maximum diameter of 8 mm. Calcifications lie adjacent to the distal pancreatic and common bile duct in the pancreatic head new since the prior CT. No defined pancreatic mass. There is ill-defined soft tissue adjacent to the common bile duct along the porta hepatis. No evidence of pancreatic inflammation. Spleen: Normal in size without focal abnormality. Adrenals/Urinary Tract: No adrenal mass. Kidneys normal in overall size and position with symmetric enhancement. There are wedge-shaped areas of hypoattenuation, anterolateral left kidney, mid to lower pole and posterior upper pole the right kidney, new since the prior CT, consistent with infarcts. No defined renal mass. No hydronephrosis. Ureters are grossly normal in course and in caliber. Bladder is unremarkable. Stomach/Bowel: Stomach is mostly decompressed, otherwise unremarkable. Grossly normal appearance of the small bowel. Right colon is mildly distended up to 5 cm. There is no wall thickening or inflammation. Vascular/Lymphatic: Prominent and mildly enlarged shoddy lymph nodes in the upper abdomen, gastrohepatic ligament, Peri celiac and aortocaval. Gastrohepatic ligament node measures largest, 1.4 cm. Nodes have increased when compared to the prior CT. Dense aortoiliac atherosclerotic calcification. No aneurysm. Reproductive: Mild stable prostate enlargement. Other: No abdominal wall hernia.  No ascites. Musculoskeletal: No fracture or acute finding. No osteoblastic or osteolytic lesions. Review of the MIP images confirms the above findings. IMPRESSION: CTA CHEST 1. No evidence of a pulmonary embolism. 2. No acute findings. 3. 2 left  upper lobe pulmonary nodules, both spiculated appearance and suspicious, largest abutting the anterior pleural surface measuring 1.4 cm in greatest dimension. Both nodules have increased in size from the previous CT and are concerning for primary lung carcinoma. 4. Advanced emphysema.  Aortic atherosclerosis. CT ABDOMEN AND PELVIS 1. No acute findings. 2. Multiple low-attenuation liver masses consistent with metastatic disease, new from the prior CT. There are also enlarged lymph nodes in the upper abdomen, largest along the gastrohepatic ligament, consistent with metastatic disease. History provided for the current head CT reports pancreatic cancer. 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 3. No defined pancreatic mass, although there is irregular dilation of the pancreatic duct that has increased  when compared to the prior CT. 4. Dense aortic atherosclerosis. 5. Wedge-shaped hypoattenuating areas in both kidneys, new since the prior CT, suspected to be infarcts. Electronically Signed   By: Alm Parkins M.D.   On: 01/09/2024 14:53   CT Head Wo Contrast Result Date: 01/09/2024 CLINICAL DATA:  Mental status change. Multiple seizures today. History of pancreatic carcinoma. EXAM: CT HEAD WITHOUT CONTRAST TECHNIQUE: Contiguous axial images were obtained from the base of the skull through the vertex without intravenous contrast. RADIATION DOSE REDUCTION: This exam was performed according to the departmental dose-optimization program which includes automated exposure control, adjustment of the mA and/or kV according to patient size and/or use of iterative reconstruction technique. COMPARISON:  12/26/2020. FINDINGS: Brain: No evidence of acute infarction, hemorrhage, hydrocephalus, extra-axial collection or mass lesion/mass effect. Vascular: No hyperdense vessel or unexpected calcification. Skull: Normal. Negative for fracture or focal lesion. Sinuses/Orbits: Globes and orbits are unremarkable.  Visualized sinuses are clear. Other: None. IMPRESSION: No acute intracranial abnormalities. Electronically Signed   By: Alm Parkins M.D.   On: 01/09/2024 14:27   DG Elbow Complete Left Result Date: 01/09/2024 CLINICAL DATA:  Elbow pain. History of pancreatic cancer. No history of reported trauma EXAM: LEFT ELBOW - COMPLETE 2 VIEW COMPARISON:  None Available. FINDINGS: Hyperostosis along the olecranon. No fracture or dislocation. Grossly preserved joint spaces. No large joint effusion on the lateral view is somewhat rotated limiting evaluation for subtle joint effusion. Repeat lateral film as clinically appropriate. IMPRESSION: Grossly no acute osseous abnormality.  Limited lateral view. Electronically Signed   By: Ranell Bring M.D.   On: 01/09/2024 12:39    Microbiology: Results for orders placed or performed during the hospital encounter of 01/09/24  Blood culture (routine x 2)     Status: None (Preliminary result)   Collection Time: 01/09/24 11:58 AM   Specimen: BLOOD  Result Value Ref Range Status   Specimen Description BLOOD SITE NOT SPECIFIED  Final   Special Requests   Final    BOTTLES DRAWN AEROBIC AND ANAEROBIC Blood Culture results may not be optimal due to an inadequate volume of blood received in culture bottles   Culture   Final    NO GROWTH 4 DAYS Performed at [facility], [address] [place], [place] [address]    Report Status PENDING  Incomplete  Resp panel by RT-PCR (RSV, Flu A&B, Covid) Anterior Nasal Swab     Status: Abnormal   Collection Time: 01/09/24 11:58 AM   Specimen: Anterior Nasal Swab  Result Value Ref Range Status   SARS Coronavirus 2 by RT PCR POSITIVE (A) NEGATIVE Final   Influenza A by PCR NEGATIVE NEGATIVE Final   Influenza B by PCR NEGATIVE NEGATIVE Final    Comment: (NOTE) The Xpert Xpress SARS-CoV-2/FLU/RSV plus assay is intended as an aid in the diagnosis of influenza from Nasopharyngeal swab specimens and should not be used as a sole  basis for treatment. Nasal washings and aspirates are unacceptable for Xpert Xpress SARS-CoV-2/FLU/RSV testing.  Fact Sheet for Patients: BloggerCourse.com  Fact Sheet for Healthcare Providers: SeriousBroker.it  This test is not yet approved or cleared by the [place]  FDA and has been authorized for detection and/or diagnosis of SARS-CoV-2 by FDA under an Emergency Use Authorization (EUA). This EUA will remain in effect (meaning this test can be used) for the duration of the COVID-19 declaration under Section 564(b)(1) of the Act, 21 U.S.C. section 360bbb-3(b)(1), unless the authorization is terminated or revoked.  Resp Syncytial Virus by PCR NEGATIVE NEGATIVE Final    Comment: (NOTE) Fact Sheet for Patients: BloggerCourse.com  Fact Sheet for Healthcare Providers: SeriousBroker.it  This test is not yet approved or cleared by the [place]  FDA and has been authorized for detection and/or diagnosis of SARS-CoV-2 by FDA under an Emergency Use Authorization (EUA). This EUA will remain in effect (meaning this test can be used) for the duration of the COVID-19 declaration under Section 564(b)(1) of the Act, 21 U.S.C. section 360bbb-3(b)(1), unless the authorization is terminated or revoked.  Performed at [facility], [address] [place], [place] [address]   Blood culture (routine x 2)     Status: None (Preliminary result)   Collection Time: 01/09/24 12:03 PM   Specimen: BLOOD  Result Value Ref Range Status   Specimen Description BLOOD SITE NOT SPECIFIED  Final   Special Requests   Final    BOTTLES DRAWN AEROBIC AND ANAEROBIC Blood Culture results may not be optimal due to an inadequate volume of blood received in culture bottles   Culture   Final    NO GROWTH 4 DAYS Performed at [facility], [address], [place], [place] [address]    Report  Status PENDING  Incomplete  Surgical pcr screen     Status: None   Collection Time: 01/12/24  8:06 AM   Specimen: Nasal Mucosa; Nasal Swab  Result Value Ref Range Status   MRSA, PCR NEGATIVE NEGATIVE Final   Staphylococcus aureus NEGATIVE NEGATIVE Final    Comment: (NOTE) The Xpert SA Assay (FDA approved for NASAL specimens in patients 56 years of age and older), is one component of a comprehensive surveillance program. It is not intended to diagnose infection nor to guide or monitor treatment. Performed at [facility], [address], [place], [place] [address]     Labs: CBC: Recent Labs  Lab 01/09/24 1157 01/09/24 1901 01/10/24 0748 01/11/24 0451 01/12/24 0435 01/13/24 0903  WBC 15.0* 13.1* 12.4* 10.5 9.1 11.6*  NEUTROABS 11.3*  --   --   --   --   --   HGB 11.5* 11.9* 12.1* 12.4* 11.4* 11.4*  HCT 35.4* 36.6* 37.5* 38.0* 34.9* 34.4*  MCV 91.2 90.8 91.0 89.2 89.7 89.4  PLT 121* 92* 91* 94* 145* 137*   Basic Metabolic Panel: Recent Labs  Lab 01/09/24 1157 01/09/24 1901 01/10/24 0748 01/11/24 0451 01/12/24 0435 01/13/24 0903  NA 137  --  137 137 135 137  K 4.9  --  3.6 3.6 3.3* 4.1  CL 100  --  103 101 101 107  CO2 25  --  20* 23 23 19*  GLUCOSE 84  --  73 70 85 89  BUN 19  --  11 8 10 8   CREATININE 1.03 0.88 0.80 0.83 1.18 0.91  CALCIUM  9.1  --  8.9 9.1 8.8* 9.2   Liver Function Tests: Recent Labs  Lab 01/09/24 1157 01/10/24 0748 01/11/24 0451 01/12/24 0435 01/13/24 0903  AST 52* 48* 46* 47* 54*  ALT 28 25 24 25 28   ALKPHOS 117 121 145* 159* 206*  BILITOT 0.4 0.6 0.5 0.4 0.5  PROT 6.9 6.8 6.5 6.5 7.0  ALBUMIN 3.1* 2.9* 2.8* 2.7* 2.9*   CBG: No results for input(s): GLUCAP in the last 168 hours.  Discharge time spent: approximately 45 minutes spent on discharge counseling, evaluation of patient on day of discharge, and coordination of discharge planning with nursing, social work, pharmacy and  case management  Signed: Lonni SHAUNNA Dalton, MD [facility] 01/13/2024

## 2024-01-13 NOTE — Progress Notes (Addendum)
 Occupational Therapy Treatment Patient Details Name: Lonnie Blair MRN: [id number] DOB: [birth year] Today's Date: 01/13/2024   History of present illness Pt is a 73 y/o male presenting on 7/19 with L sided weakness. 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 EEG with cortical dysfunction from R hemisphere, postictal state. COVID +. CTA negative for PE but concern for lung CA and advanced emphysema, CT abd concern for metastatic disease. PMH includes: seizures, pancreatic cancer (?per family).   OT comments  Pt supine in bed and agreeable to OT. Soiled in stool and reports knowing he had a BM but did not call for assistance.  Max assist for hygiene/clean up, then transferred to [facility] with loose stools and able to manage with min assist.  RN notified of loose stools.  He requires grossly supervision for mobility,  supervision for most ADLs.  Slowed processing and decreased problem solving.  BP stable throughout session.  Continue to recommend OPOT services, recommend 24/7 support at dc with assist for IADls (meds, meals and driving).       If plan is discharge home, recommend the following:  A little help with walking and/or transfers;A lot of help with bathing/dressing/bathroom;Assistance with cooking/housework;Direct supervision/assist for medications management;Direct supervision/assist for financial management;Assist for transportation;Help with stairs or ramp for entrance;Supervision due to cognitive status   Equipment Recommendations  BSC/3in1    Recommendations for Other Services      Precautions / Restrictions Precautions Precautions: Fall Recall of Precautions/Restrictions: Impaired Restrictions Weight Bearing Restrictions Per Provider Order: No       Mobility Bed Mobility Overal bed mobility: Needs Assistance Bed Mobility: Supine to Sit, Sit to Supine     Supine to sit: Supervision Sit to supine: Supervision         Transfers Overall transfer level: Needs assistance Equipment used: None Transfers: Sit to/from Stand Sit to Stand: Supervision           General transfer comment: for safety     Balance Overall balance assessment: Needs assistance Sitting-balance support: No upper extremity supported, Feet supported Sitting balance-Leahy Scale: Fair     Standing balance support: During functional activity, No upper extremity supported Standing balance-Leahy Scale: Fair                             ADL either performed or assessed with clinical judgement   ADL Overall ADL's : Needs assistance/impaired     Grooming: Set up;Sitting                   Toilet Transfer: Supervision/safety;Ambulation Toilet Transfer Details (indicate cue type and reason): to [facility] Toileting- Clothing Manipulation and Hygiene: Maximal assistance;Sit to/from stand Toileting - Clothing Manipulation Details (indicate cue type and reason): soiled bed, assist for hygiene.  Able to manage after toileting on commode again     Functional mobility during ADLs: Supervision/safety;Cueing for safety      Extremity/Trunk Assessment              Vision       Perception     Praxis     Communication Communication Communication: No apparent difficulties   Cognition Arousal: Alert Behavior During Therapy: Flat affect Cognition: Cognition impaired     Awareness: Intellectual awareness impaired Memory impairment (select all impairments): Short-term memory, Working memory Attention impairment (select first level of impairment): Focused attention Executive functioning impairment (select all impairments): Organization, Reasoning, Problem solving, Initiation OT -  Cognition Comments: pt in bed soiled with stool, reports having accident 30 min before OT session but does not call out for assist.  pt initation and slow processing.  flat affect                 Following commands:  Impaired Following commands impaired: Follows one step commands with increased time      Cueing   Cueing Techniques: Verbal cues  Exercises      Shoulder Instructions       General Comments pt with decreased activity tolerance, VSS and BP stable    Pertinent Vitals/ Pain       Pain Assessment Pain Assessment: No/denies pain  Home Living                                          Prior Functioning/Environment              Frequency  Min 2X/week        Progress Toward Goals  OT Goals(current goals can now be found in the care plan section)  Progress towards OT goals: Progressing toward goals  Acute Rehab OT Goals Patient Stated Goal: none stated Time For Goal Achievement: 01/25/24 Potential to Achieve Goals: Good  Plan      Co-evaluation                 AM-PAC OT 6 Clicks Daily Activity     Outcome Measure   Help from another person eating meals?: A Little Help from another person taking care of personal grooming?: A Little Help from another person toileting, which includes using toliet, bedpan, or urinal?: A Lot Help from another person bathing (including washing, rinsing, drying)?: A Little Help from another person to put on and taking off regular upper body clothing?: A Little Help from another person to put on and taking off regular lower body clothing?: A Little 6 Click Score: 17    End of Session Equipment Utilized During Treatment: Gait belt  OT Visit Diagnosis: Other abnormalities of gait and mobility (R26.89);Muscle weakness (generalized) (M62.81);Other symptoms and signs involving cognitive function   Activity Tolerance Patient tolerated treatment well   Patient Left in bed;with call bell/phone within reach;with bed alarm set   Nurse Communication Mobility status        Time: 8954-8892 OT Time Calculation (min): 22 min  Charges: OT General Charges $OT Visit: 1 Visit OT Treatments $Self Care/Home  Management : 8-22 mins  Etta NOVAK, OT Acute Rehabilitation Services Office [phone] Secure Chat Preferred    Etta GORMAN [place] 01/13/2024, 1:52 PM

## 2024-01-13 NOTE — Discharge Instructions (Signed)

## 2024-01-13 NOTE — TOC Transition Note (Signed)
 Transition of Care [facility]) - Discharge Note   Patient Details  Name: Lonnie Blair MRN: [id number] Date of Birth: 04-07-1951  [facility] CM/SW Contact:  Andrez JULIANNA Shan, RN Phone Number: 01/13/2024, 10:22 AM   Clinical Narrative:     Pt is discharging home with outpatient therapy through [facility]. CM has sent the referral to [facility]. [facility] will contact the patient for the first appointment.  Pt states he has a BSC at home.  Pt lives with spouse and she will provide transportation home.  Final next level of care: OP Rehab Barriers to Discharge: No Barriers Identified   Patient Goals and CMS Choice     Choice offered to / list presented to : Patient      Discharge Placement                       Discharge Plan and Services Additional resources added to the After Visit Summary for                                       Social Drivers of Health (SDOH) Interventions SDOH Screenings   Food Insecurity: No Food Insecurity (01/09/2024)  Housing: Low Risk  (01/09/2024)  Transportation Needs: No Transportation Needs (01/09/2024)  Utilities: Not At Risk (01/09/2024)  Social Connections: Moderately Integrated (01/09/2024)  Tobacco Use: High Risk (01/09/2024)     Readmission Risk Interventions    01/01/2023   11:08 AM  Readmission Risk Prevention Plan  Post Dischage Appt Complete  Medication Screening Complete  Transportation Screening Complete

## 2024-01-13 NOTE — Progress Notes (Signed)
 Discharged to home after IV access removed and discharge instructions provided by [facility] nurse [place].  All questions answered.

## 2024-01-13 NOTE — Plan of Care (Signed)
  Problem: Clinical Measurements: Goal: Ability to maintain clinical measurements within normal limits will improve Outcome: Progressing   Problem: Respiratory: Goal: Complications related to the disease process, condition or treatment will be avoided or minimized Outcome: Progressing   Problem: Nutrition: Goal: Adequate nutrition will be maintained Outcome: Progressing   Problem: Safety: Goal: Ability to remain free from injury will improve Outcome: Progressing   Problem: Skin Integrity: Goal: Risk for impaired skin integrity will decrease Outcome: Progressing

## 2024-01-13 NOTE — TOC CAGE-AID Note (Signed)
 Transition of Care [facility]) - CAGE-AID Screening   Patient Details  Name: Lonnie Blair MRN: [id number] Date of Birth: 1950-07-25  [facility]) CM/SW Contact:    Andrez JULIANNA Maxden, RN Phone Number: 01/13/2024, 10:19 AM   Clinical Narrative:  Inpatient/ outpatient alcohol counseling resources provided to the patient.  CAGE-AID Screening:    Have You Ever Felt You Ought to Cut Down on Your Drinking or Drug Use?: Yes Have People Annoyed You By Critizing Your Drinking Or Drug Use?: Yes Have You Felt Bad Or Guilty About Your Drinking Or Drug Use?: Yes Have You Ever Had a Drink or Used Drugs First Thing In The Morning to Steady Your Nerves or to Get Rid of a Hangover?: No CAGE-AID Score: 3  Substance Abuse Education Offered: Yes  Substance abuse interventions: Transport planner, Patient Counseling

## 2024-01-13 NOTE — Progress Notes (Addendum)
 Speech Language Pathology Treatment: Dysphagia  Patient Details Name: Lonnie Blair MRN: [id number] DOB: 02/27/1951 Today's Date: 01/13/2024 Time: 8967-8955 SLP Time Calculation (min) (ACUTE ONLY): 12 min  Assessment / Plan / Recommendation Clinical Impression  Pt seen for skilled SLP follow up to assure po tolerance given his coughing noted with po intake and h/o remote report of dysphagia in 2012. He easily passed 3 ounce Yale swallow screen and RN reports pt took his pills without difficulty today. Today he denies dysphagia, reports he is tolerating po well - but didn't eat much. On breakfast tray, he consumed all of his oatmeal and only few bites of his yogurt.  NO PO documented during hospital coarse.   No liquids consumed during his meal - as pt reports he does not drink liquids when he eats. Recommend consider consuming liquids if he senses retention of food/pills, etc in throat or esophagus - especially given h/o thyroid nodules and dysphagia. Voice was strong and cough was productive to viscous frothy secretions *not accompanied by po he had consumed.   Given concern for potential cancer- potential for treatment that could attenuate immunse response, advised pt to maxmize his nutrition to prevent further weight loss. Further recommend pt maintain strength of his cough and expectoration for airway protection, ambulate as safely as he can and conduct oral care at least BID *with toothbrush -dentition and tongue * - to decrease bacteria load and mitigate aspiration pna risk.   Pt able to verbalize precautions with teach back and Mod I cues *min delay*. Suspect his coughing noted on evaluation July 19th was due to his COVID and not aspiration. Encouraged him to eat whatever he can manage to eat and try to get adequate protein - he would benefit from follow up with RD as an OP for nutrition. Thanks for allowing this SLP to assist with pt's care plan.      HPI HPI: [name] is a 73 y.o.  male with hx of COPD, tobacco use, alcohol use, lung nodule, seizures on zonisamide  who presents with generalized weakness, cough, shortness of breath and confusion.  Reportedly had 104 fever at home, has been very tired and somnolent with nausea and vomiting and 2 seizures at home. Workup in the ED with WBC of 15.  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 He is positive for COVID. 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.  Pt is s/p liver biospy due to concerns for pt having cancer.      SLP Plan  All goals met   No f/u        Recommendations  Diet recommendations: Regular;Thin liquid Liquids provided via: Cup;Straw Medication Administration: Whole meds with liquid Supervision: Patient able to self feed Compensations: Slow rate;Small sips/bites Postural Changes and/or Swallow Maneuvers: Seated upright 90 degrees;Upright 30-60 min after meal                  Oral care BID     Dysphagia, unspecified (R13.10)     All goals met    Madelin POUR, MS [facility] SLP Acute Rehab Services Office [phone]  Nicolas Emmie Caldron  01/13/2024, 10:57 AM

## 2024-01-14 LAB — CULTURE, BLOOD (ROUTINE X 2)
Culture: NO GROWTH
Culture: NO GROWTH

## 2024-01-14 LAB — SURGICAL PATHOLOGY

## 2024-01-18 ENCOUNTER — Emergency Department (HOSPITAL_COMMUNITY)

## 2024-01-18 ENCOUNTER — Telehealth: Payer: Self-pay | Admitting: Internal Medicine

## 2024-01-18 ENCOUNTER — Other Ambulatory Visit: Payer: Self-pay

## 2024-01-18 ENCOUNTER — Inpatient Hospital Stay (HOSPITAL_COMMUNITY)
Admission: EM | Admit: 2024-01-18 | Discharge: 2024-02-22 | DRG: 951 | Disposition: E | Attending: Internal Medicine | Admitting: Internal Medicine

## 2024-01-18 DIAGNOSIS — I63521 Cerebral infarction due to unspecified occlusion or stenosis of right anterior cerebral artery: Secondary | ICD-10-CM | POA: Diagnosis present

## 2024-01-18 DIAGNOSIS — J449 Chronic obstructive pulmonary disease, unspecified: Secondary | ICD-10-CM | POA: Diagnosis present

## 2024-01-18 DIAGNOSIS — Z7901 Long term (current) use of anticoagulants: Secondary | ICD-10-CM

## 2024-01-18 DIAGNOSIS — Z79899 Other long term (current) drug therapy: Secondary | ICD-10-CM

## 2024-01-18 DIAGNOSIS — C3492 Malignant neoplasm of unspecified part of left bronchus or lung: Secondary | ICD-10-CM | POA: Diagnosis present

## 2024-01-18 DIAGNOSIS — I639 Cerebral infarction, unspecified: Secondary | ICD-10-CM | POA: Diagnosis not present

## 2024-01-18 DIAGNOSIS — F17200 Nicotine dependence, unspecified, uncomplicated: Secondary | ICD-10-CM | POA: Diagnosis present

## 2024-01-18 DIAGNOSIS — R400 Somnolence: Secondary | ICD-10-CM | POA: Diagnosis present

## 2024-01-18 DIAGNOSIS — I69398 Other sequelae of cerebral infarction: Secondary | ICD-10-CM

## 2024-01-18 DIAGNOSIS — Z993 Dependence on wheelchair: Secondary | ICD-10-CM

## 2024-01-18 DIAGNOSIS — R636 Underweight: Secondary | ICD-10-CM | POA: Diagnosis present

## 2024-01-18 DIAGNOSIS — C7889 Secondary malignant neoplasm of other digestive organs: Secondary | ICD-10-CM | POA: Diagnosis present

## 2024-01-18 DIAGNOSIS — R29727 NIHSS score 27: Secondary | ICD-10-CM | POA: Diagnosis present

## 2024-01-18 DIAGNOSIS — Z8616 Personal history of COVID-19: Secondary | ICD-10-CM

## 2024-01-18 DIAGNOSIS — Z515 Encounter for palliative care: Principal | ICD-10-CM

## 2024-01-18 DIAGNOSIS — Z681 Body mass index (BMI) 19 or less, adult: Secondary | ICD-10-CM

## 2024-01-18 DIAGNOSIS — I10 Essential (primary) hypertension: Secondary | ICD-10-CM | POA: Diagnosis present

## 2024-01-18 DIAGNOSIS — C799 Secondary malignant neoplasm of unspecified site: Secondary | ICD-10-CM

## 2024-01-18 DIAGNOSIS — C787 Secondary malignant neoplasm of liver and intrahepatic bile duct: Secondary | ICD-10-CM | POA: Diagnosis present

## 2024-01-18 DIAGNOSIS — R4701 Aphasia: Secondary | ICD-10-CM | POA: Diagnosis present

## 2024-01-18 DIAGNOSIS — G8194 Hemiplegia, unspecified affecting left nondominant side: Secondary | ICD-10-CM | POA: Diagnosis present

## 2024-01-18 DIAGNOSIS — I63519 Cerebral infarction due to unspecified occlusion or stenosis of unspecified middle cerebral artery: Secondary | ICD-10-CM | POA: Diagnosis present

## 2024-01-18 DIAGNOSIS — Z66 Do not resuscitate: Secondary | ICD-10-CM | POA: Diagnosis present

## 2024-01-18 DIAGNOSIS — I6521 Occlusion and stenosis of right carotid artery: Secondary | ICD-10-CM

## 2024-01-18 DIAGNOSIS — Z1152 Encounter for screening for COVID-19: Secondary | ICD-10-CM

## 2024-01-18 DIAGNOSIS — Z886 Allergy status to analgesic agent status: Secondary | ICD-10-CM

## 2024-01-18 DIAGNOSIS — Z7401 Bed confinement status: Secondary | ICD-10-CM

## 2024-01-18 DIAGNOSIS — R64 Cachexia: Secondary | ICD-10-CM | POA: Diagnosis present

## 2024-01-18 HISTORY — DX: Cerebral infarction, unspecified: I63.9

## 2024-01-18 HISTORY — DX: Malignant neoplasm of unspecified part of unspecified bronchus or lung: C34.90

## 2024-01-18 LAB — COMPREHENSIVE METABOLIC PANEL WITH GFR
ALT: 24 U/L (ref 0–44)
AST: 58 U/L — ABNORMAL HIGH (ref 15–41)
Albumin: 2.3 g/dL — ABNORMAL LOW (ref 3.5–5.0)
Alkaline Phosphatase: 193 U/L — ABNORMAL HIGH (ref 38–126)
Anion gap: 9 (ref 5–15)
BUN: 23 mg/dL (ref 8–23)
CO2: 21 mmol/L — ABNORMAL LOW (ref 22–32)
Calcium: 8.2 mg/dL — ABNORMAL LOW (ref 8.9–10.3)
Chloride: 99 mmol/L (ref 98–111)
Creatinine, Ser: 0.91 mg/dL (ref 0.61–1.24)
GFR, Estimated: >60 mL/min (ref >60)
Glucose, Bld: 135 mg/dL — ABNORMAL HIGH (ref 70–99)
Potassium: 4.7 mmol/L (ref 3.5–5.1)
Sodium: 129 mmol/L — ABNORMAL LOW (ref 135–145)
Total Bilirubin: 0.6 mg/dL (ref 0.0–1.2)
Total Protein: 5.9 g/dL — ABNORMAL LOW (ref 6.5–8.1)

## 2024-01-18 LAB — I-STAT CHEM 8, ED
BUN: 23 mg/dL (ref 8–23)
Calcium, Ion: 1.17 mmol/L (ref 1.15–1.40)
Chloride: 98 mmol/L (ref 98–111)
Creatinine, Ser: 0.8 mg/dL (ref 0.61–1.24)
Glucose, Bld: 136 mg/dL — ABNORMAL HIGH (ref 70–99)
HCT: 29 % — ABNORMAL LOW (ref 39.0–52.0)
Hemoglobin: 9.9 g/dL — ABNORMAL LOW (ref 13.0–17.0)
Potassium: 4.4 mmol/L (ref 3.5–5.1)
Sodium: 130 mmol/L — ABNORMAL LOW (ref 135–145)
TCO2: 22 mmol/L (ref 22–32)

## 2024-01-18 LAB — DIFFERENTIAL
Abs Immature Granulocytes: 0.05 K/uL (ref 0.00–0.07)
Basophils Absolute: 0.1 K/uL (ref 0.0–0.1)
Basophils Relative: 1 %
Eosinophils Absolute: 0.9 K/uL — ABNORMAL HIGH (ref 0.0–0.5)
Eosinophils Relative: 7 %
Immature Granulocytes: 0 %
Lymphocytes Relative: 10 %
Lymphs Abs: 1.2 K/uL (ref 0.7–4.0)
Monocytes Absolute: 0.8 K/uL (ref 0.1–1.0)
Monocytes Relative: 6 %
Neutro Abs: 9.8 K/uL — ABNORMAL HIGH (ref 1.7–7.7)
Neutrophils Relative %: 76 %

## 2024-01-18 LAB — CBC
HCT: 29.3 % — ABNORMAL LOW (ref 39.0–52.0)
Hemoglobin: 9.3 g/dL — ABNORMAL LOW (ref 13.0–17.0)
MCH: 28.9 pg (ref 26.0–34.0)
MCHC: 31.7 g/dL (ref 30.0–36.0)
MCV: 91 fL (ref 80.0–100.0)
Platelets: 94 K/uL — ABNORMAL LOW (ref 150–400)
RBC: 3.22 MIL/uL — ABNORMAL LOW (ref 4.22–5.81)
RDW: 13.4 % (ref 11.5–15.5)
WBC: 12.9 K/uL — ABNORMAL HIGH (ref 4.0–10.5)
nRBC: 0 % (ref 0.0–0.2)

## 2024-01-18 LAB — APTT: aPTT: 39 s — ABNORMAL HIGH (ref 24–36)

## 2024-01-18 LAB — CBG MONITORING, ED: Glucose-Capillary: 149 mg/dL — ABNORMAL HIGH (ref 70–99)

## 2024-01-18 LAB — PROTIME-INR
INR: 1.5 — ABNORMAL HIGH (ref 0.8–1.2)
Prothrombin Time: 18.5 s — ABNORMAL HIGH (ref 11.4–15.2)

## 2024-01-18 MED ORDER — LORAZEPAM 2 MG/ML IJ SOLN
1.0000 mg | INTRAMUSCULAR | Status: DC | PRN
Start: 2024-01-18 — End: 2024-01-19
  Administered 2024-01-19 (×2): 1 mg via INTRAVENOUS
  Filled 2024-01-18 (×2): qty 1

## 2024-01-18 MED ORDER — SODIUM CHLORIDE 0.9% FLUSH
3.0000 mL | Freq: Once | INTRAVENOUS | Status: AC
  Administered 2024-01-18: 3 mL via INTRAVENOUS

## 2024-01-18 MED ORDER — MORPHINE SULFATE (PF) 4 MG/ML IV SOLN: 4.0000 mg | INTRAVENOUS | Status: DC | PRN

## 2024-01-18 MED ORDER — GLYCOPYRROLATE 0.2 MG/ML IJ SOLN
0.2000 mg | INTRAMUSCULAR | Status: DC | PRN
Start: 2024-01-18 — End: 2024-01-22
  Administered 2024-01-19 (×3): 0.2 mg via INTRAVENOUS
  Filled 2024-01-18 (×3): qty 1

## 2024-01-18 MED ORDER — ACETAMINOPHEN 650 MG RE SUPP: 650.0000 mg | Freq: Four times a day (QID) | RECTAL | Status: DC | PRN

## 2024-01-18 MED ORDER — HALOPERIDOL LACTATE 5 MG/ML IJ SOLN: 5.0000 mg | Freq: Four times a day (QID) | INTRAMUSCULAR | Status: DC | PRN

## 2024-01-18 MED ORDER — ONDANSETRON 4 MG PO TBDP: 4.0000 mg | ORAL_TABLET | Freq: Four times a day (QID) | ORAL | Status: DC | PRN

## 2024-01-18 MED ORDER — POLYVINYL ALCOHOL 1.4 % OP SOLN: 1.0000 [drp] | Freq: Four times a day (QID) | OPHTHALMIC | Status: DC | PRN

## 2024-01-18 MED ORDER — ACETAMINOPHEN 325 MG PO TABS: 650.0000 mg | ORAL_TABLET | Freq: Four times a day (QID) | ORAL | Status: DC | PRN

## 2024-01-18 MED ORDER — GLYCOPYRROLATE 0.2 MG/ML IJ SOLN: 0.2000 mg | INTRAMUSCULAR | Status: DC | PRN

## 2024-01-18 MED ORDER — GLYCOPYRROLATE 1 MG PO TABS: 1.0000 mg | ORAL_TABLET | ORAL | Status: DC | PRN

## 2024-01-18 MED ORDER — ONDANSETRON HCL 4 MG/2ML IJ SOLN: 4.0000 mg | Freq: Four times a day (QID) | INTRAMUSCULAR | Status: DC | PRN

## 2024-01-18 MED ORDER — IOHEXOL 350 MG/ML SOLN
100.0000 mL | Freq: Once | INTRAVENOUS | Status: AC | PRN
  Administered 2024-01-18: 100 mL via INTRAVENOUS

## 2024-01-18 NOTE — ED Notes (Signed)
 O2 2L/[place] FOR PT COMFORT

## 2024-01-18 NOTE — H&P (Signed)
 History and Physical      Lonnie Blair FMW:[id number] DOB: 16-Dec-1950 DOA: 01/18/2024; DOS: 01/18/2024  PCP: Lonnie Nell SAILOR, FNP *** Patient coming from: home ***  I have personally briefly reviewed patient'Blair old medical records in [facility]  Chief Complaint: ***  HPI: Lonnie Blair is a 73 y.o. male with medical history significant for *** who is admitted to [facility] on 01/18/2024 with *** after presenting from home*** to [facility] ED complaining of ***.    ***       ***   ED Course:  Vital signs in the ED were notable for the following: ***  Labs were notable for the following: ***  Per my interpretation, EKG in ED demonstrated the following:  ***  Imaging in the ED, per corresponding formal radiology read, was notable for the following:  ***  While in the ED, the following were administered: ***  Subsequently, the patient was admitted  ***  ***red    Review of Systems: As per HPI otherwise 10 point review of systems negative.   Past Medical History:  Diagnosis Date   Seizures [facility])     Past Surgical History:  Procedure Laterality Date   OTHER SURGICAL HISTORY  2010   nasal non cancerous tumor removed    Social History:  reports that he has been smoking. He does not have any smokeless tobacco history on file. He reports current alcohol  use. He reports that he does not use drugs.   Allergies  Allergen Reactions   Tylenol  [Acetaminophen ] Itching    No family history on file.  Family history reviewed and not pertinent ***   Prior to Admission medications   Medication Sig Start Date End Date Taking? Authorizing Provider  albuterol  (VENTOLIN  HFA) 108 (90 Base) MCG/ACT inhaler Inhale 1-2 puffs into the lungs every 6 (six) hours as needed for wheezing or shortness of breath. 10/28/23   [provider]  apixaban  (ELIQUIS ) 5 MG TABS tablet Take 1 tablet (5 mg total) by mouth 2 (two) times daily. 01/13/24   Danford, Blair SQUIBB,  MD  atorvastatin  (LIPITOR) 40 MG tablet Take 1 tablet (40 mg total) by mouth daily. 01/14/24   Danford, Blair SQUIBB, MD  ipratropium-albuterol  (DUONEB) 0.5-2.5 (3) MG/3ML SOLN Take 3 mLs by nebulization every 6 (six) hours as needed. 10/28/23   [provider]  lactose free nutrition (BOOST PLUS) LIQD Take 237 mLs by mouth once a week. Drink 237 ml'Blair of Strawberry Boost Plus    [provider]  NICOTINE  TD Place 1 patch onto the skin daily. Patient not taking: Reported on 01/09/2024    [provider]  zonisamide  (ZONEGRAN ) 100 MG capsule Take 1 capsule (100 mg total) by mouth every morning AND 2 capsules (200 mg total) at bedtime. 01/13/24   Lonnie Blair SQUIBB, MD     Objective    Physical Exam: Vitals:   01/18/24 2215 01/18/24 2226 01/18/24 2230 01/18/24 2248  BP: 120/83   126/79  Pulse: (!) 113  (!) 112 68  Resp: (!) 33  (!) 28 17  Temp:    98.5 F (36.9 C)  TempSrc:    Oral  SpO2: 99%  100% 99%  Weight:  39.6 kg    Height:  5' 6 (1.676 m)      General: appears to be stated age; alert, oriented Skin: warm, dry, no rash Head:  AT/[place] Mouth:  Oral mucosa membranes appear moist, normal dentition Neck: supple; trachea midline Heart:  RRR; did not appreciate any M/R/G Lungs: CTAB, did not appreciate any wheezes, rales, or rhonchi Abdomen: + BS; soft, ND, NT Vascular: 2+ pedal pulses b/l; 2+ radial pulses b/l Extremities: no peripheral edema, no muscle wasting Neuro: strength and sensation intact in upper and lower extremities b/l ***   *** Neuro: 5/5 strength of the proximal and distal flexors and extensors of the upper and lower extremities bilaterally; sensation intact in upper and lower extremities b/l; cranial nerves II through XII grossly intact; no pronator drift; no evidence suggestive of slurred speech, dysarthria, or facial droop; Normal muscle tone. No tremors.  *** Neuro: In the setting of the patient'Blair current mental status and  associated inability to follow instructions, unable to perform full neurologic exam at this time.  As such, assessment of strength, sensation, and cranial nerves is limited at this time. Patient noted to spontaneously move all 4 extremities. No tremors.  ***    Labs on Admission: I have personally reviewed following labs and imaging studies  CBC: Recent Labs  Lab 01/12/24 0435 01/13/24 0903 01/18/24 2127 01/18/24 2215  WBC 9.1 11.6* 12.9*  --   NEUTROABS  --   --  9.8*  --   HGB 11.4* 11.4* 9.3* 9.9*  HCT 34.9* 34.4* 29.3* 29.0*  MCV 89.7 89.4 91.0  --   PLT 145* 137* 94*  --    Basic Metabolic Panel: Recent Labs  Lab 01/12/24 0435 01/13/24 0903 01/18/24 2127 01/18/24 2215  NA 135 137 129* 130*  K 3.3* 4.1 4.7 4.4  CL 101 107 99 98  CO2 23 19* 21*  --   GLUCOSE 85 89 135* 136*  BUN 10 8 23 23   CREATININE 1.18 0.91 0.91 0.80  CALCIUM  8.8* 9.2 8.2*  --    GFR: Estimated Creatinine Clearance: 46.1 mL/min (by C-G formula based on SCr of 0.8 mg/dL). Liver Function Tests: Recent Labs  Lab 01/12/24 0435 01/13/24 0903 01/18/24 2127  AST 47* 54* 58*  ALT 25 28 24   ALKPHOS 159* 206* 193*  BILITOT 0.4 0.5 0.6  PROT 6.5 7.0 5.9*  ALBUMIN 2.7* 2.9* 2.3*   No results for input(Blair): LIPASE, AMYLASE in the last 168 hours. No results for input(Blair): AMMONIA in the last 168 hours. Coagulation Profile: Recent Labs  Lab 01/18/24 2127  INR 1.5*   Cardiac Enzymes: No results for input(Blair): CKTOTAL, CKMB, CKMBINDEX, TROPONINI in the last 168 hours. BNP (last 3 results) No results for input(Blair): PROBNP in the last 8760 hours. HbA1C: No results for input(Blair): HGBA1C in the last 72 hours. CBG: Recent Labs  Lab 01/18/24 2126  GLUCAP 149*   Lipid Profile: No results for input(Blair): CHOL, HDL, LDLCALC, TRIG, CHOLHDL, LDLDIRECT in the last 72 hours. Thyroid Function Tests: No results for input(Blair): TSH, T4TOTAL, FREET4, T3FREE, THYROIDAB in  the last 72 hours. Anemia Panel: No results for input(Blair): VITAMINB12, FOLATE, FERRITIN, TIBC, IRON, RETICCTPCT in the last 72 hours. Urine analysis:    Component Value Date/Time   COLORURINE YELLOW 01/09/2024 1158   APPEARANCEUR CLEAR 01/09/2024 1158   LABSPEC >1.046 (H) 01/09/2024 1158   PHURINE 5.0 01/09/2024 1158   GLUCOSEU NEGATIVE 01/09/2024 1158   HGBUR NEGATIVE 01/09/2024 1158   BILIRUBINUR NEGATIVE 01/09/2024 1158   KETONESUR NEGATIVE 01/09/2024 1158   PROTEINUR NEGATIVE 01/09/2024 1158   NITRITE NEGATIVE 01/09/2024 1158   LEUKOCYTESUR NEGATIVE 01/09/2024 1158    Radiological Exams on Admission: CT ANGIO HEAD NECK W WO CM W PERF (CODE STROKE) Result Date:  01/18/2024 CLINICAL DATA:  Neuro deficit, acute, stroke suspected EXAM: CT ANGIOGRAPHY HEAD AND NECK CT PERFUSION BRAIN TECHNIQUE: Multidetector CT imaging of the head and neck was performed using the standard protocol during bolus administration of intravenous contrast. Multiplanar CT image reconstructions and MIPs were obtained to evaluate the vascular anatomy. Carotid stenosis measurements (when applicable) are obtained utilizing NASCET criteria, using the distal internal carotid diameter as the denominator. Multiphase CT imaging of the brain was performed following IV bolus contrast injection. Subsequent parametric perfusion maps were calculated using RAPID software. RADIATION DOSE REDUCTION: This exam was performed according to the departmental dose-optimization program which includes automated exposure control, adjustment of the mA and/or kV according to patient size and/or use of iterative reconstruction technique. CONTRAST:  OMNIPAQUE  IOHEXOL  350 MG/ML SOLN COMPARISON:  CTA 01/10/2024. FINDINGS: CTA NECK FINDINGS Aortic arch: Aortic atherosclerosis. Great vessel origins are patent. Right carotid system: Persistent occlusion of the ICA with non opacification in the upper neck. Left carotid system: No evidence of  dissection, stenosis (50% or greater) or occlusion. Vertebral arteries: Codominant. No evidence of dissection, stenosis (50% or greater) or occlusion. Skeleton: No acute fracture. Other neck: No acute abnormality. Upper chest: Biapical scarring. Emphysema. Redemonstrated spiculated left upper lobe pulmonary nodules, concerning for malignancy and better evaluated on CT chest from 01/09/2024. Review of the MIP images confirms the above findings CTA HEAD FINDINGS Anterior circulation: Persistent non opacification the right intra poor opacification right ICA. There appears to be extension of thrombus farther into the right ICA terminus with new poor opacification of the right ICA. Diminished but persistent right M1 MCA and proximal M2 MCA branches opacification. Significantly diminished more distal MCA opacification. Posterior circulation: Bilateral intradural vertebral arteries, basilar artery and posterior cerebral arteries are patent without proximal high-grade stenosis. Right fetal type PCA. Venous sinuses: As permitted by contrast timing, patent. Review of the MIP images confirms the above findings CT Brain Perfusion Findings: ASPECTS: 0-1 CBF (<30%) Volume: Perfusion (Tmax>6.0s) volume: Mismatch Volume: 65mL Infarction Location: Right ACA and MCA territories. IMPRESSION: 1. Continued right ICA occlusion with suspected interval extension at the ICA terminus and new poor opacification of the right ACA as well as distal right ACA and MCA vessels/collaterals. 2. On CT perfusion, large (166 mL) right ACA and MCA territory infarcts with 65 mL of reported penumbra in the posterior right MCA territory. 3. Redemonstrated spiculated left upper lobe pulmonary nodules, concerning for malignancy and better evaluated on CT chest from 01/09/2024. Imaging results were communicated on 01/18/2024 at 9: 43 pm to provider Dr. Michaela via telephone. Electronically Signed   By: Gilmore GORMAN Molt M.D.   On: 01/18/2024  22:09   CT HEAD CODE STROKE WO CONTRAST Result Date: 01/18/2024 CLINICAL DATA:  Code stroke.  Neuro deficit, acute, stroke suspected EXAM: CT HEAD WITHOUT CONTRAST TECHNIQUE: Contiguous axial images were obtained from the base of the skull through the vertex without intravenous contrast. RADIATION DOSE REDUCTION: This exam was performed according to the departmental dose-optimization program which includes automated exposure control, adjustment of the mA and/or kV according to patient size and/or use of iterative reconstruction technique. COMPARISON:  CT head 01/09/2024. FINDINGS: Brain: Hypoattenuation loss of gray differentiation in the right basal ganglia, right insula and overlying frontal, pareital, and temporal lobes, compatible with acute right MCA and possibly ACA territory infarcts. No substantial midline shift. No evidence of acute mass occupying hemorrhage. No hydrocephalus. Vascular: See forthcoming CTA Skull: No acute fracture. Sinuses/Orbits: No acute findings. ASPECTS [facility]  Stroke Program Early CT Score) - Ganglionic level infarction (caudate, lentiform nuclei, internal capsule, insula, M1-M3 cortex): 0 - Supraganglionic infarction (M4-M6 cortex): 0-1 Total score (0-10 with 10 being normal): 0-1 IMPRESSION: 1. Findings compatible with large acute right ACA and MCA territory infarct. No substantial midline shift. 2. ASPECTS is 0-1. Code stroke imaging results were communicated on 01/18/2024 at 9:43 pm to provider Dr. Michaela via telephone, who verbally acknowledged these results. Electronically Signed   By: Gilmore GORMAN Molt M.D.   On: 01/18/2024 21:46      Assessment/Plan   Principal Problem:   Acute ischemic stroke  (HCC)   ***            ***                  ***                   ***                  ***                  ***                  ***                   ***                  ***                  ***                  ***                  ***                 ***                ***  DVT prophylaxis: SCD'Blair ***  Code Status: Full code*** Family Communication: none*** Disposition Plan: Per Rounding Team Consults called: none***;  Admission status: ***     I SPENT GREATER THAN 75 *** MINUTES IN CLINICAL CARE TIME/MEDICAL DECISION-MAKING IN COMPLETING THIS ADMISSION.      Eva NOVAK Bert Givans DO [facility]  From 7PM - 7AM   01/18/2024, 10:51 PM   ***

## 2024-01-18 NOTE — Consult Note (Signed)
 NEUROLOGY CONSULT NOTE   Date of service: January 18, 2024 Patient Name: Lonnie Blair MRN:  [id number] DOB:  08-23-1950 Chief Complaint: Left-sided weakness Requesting Provider: Patt Alm Macho, MD  History of Present Illness  Lonnie Blair is a 73 y.o. male with hx of recent multifocal strokes who was recently discharged from his facility because the facility closed.  He has been home for approximately 48 hours.  At home, he was mainly bedbound, but they did get him up to get in the wheelchair at times.  He is on Eliquis .  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  LKW: Unclear Modified rankin score: 5-Severe disability-bedridden, incontinent, needs constant attention IV Thrombolysis: No, recent stroke EVT: No, poor prognosis, decision to go comfort care   NIHSS components Score: Comment  1a Level of Conscious 0[]  1[x]  2[]  3[]      1b LOC Questions 0[]  1[]  2[x]       1c LOC Commands 0[]  1[x]  2[]       2 Best Gaze 0[]  1[]  2[x]       3 Visual 0[]  1[]  2[x]  3[]      4 Facial Palsy 0[]  1[]  2[x]  3[]      5a Motor Arm - left 0[]  1[]  2[]  3[x]  4[]  UN[]    5b Motor Arm - Right 0[x]  1[]  2[]  3[]  4[]  UN[]    6a Motor Leg - Left 0[]  1[]  2[]  3[x]  4[]  UN[]    6b Motor Leg - Right 0[]  1[]  2[x]  3[]  4[]  UN[]    7 Limb Ataxia 0[x]  1[]  2[]  UN[]      8 Sensory 0[]  1[]  2[x]  UN[]      9 Best Language 0[]  1[]  2[]  3[x]      10 Dysarthria 0[]  1[]  2[x]  UN[]      11 Extinct. and Inattention 0[]  1[]  2[x]       TOTAL: 27       Past History   Past Medical History:  Diagnosis Date   Seizures (HCC)     Past Surgical History:  Procedure Laterality Date   OTHER SURGICAL HISTORY  2010   nasal non cancerous tumor removed    Family History: No family history on file.  Social  History  reports that he has been smoking. He does not have any smokeless tobacco history on file. He reports current alcohol  use. He reports that he does not use drugs.  Allergies  Allergen Reactions   Tylenol  [Acetaminophen ] Itching    Medications   Current Facility-Administered Medications:    sodium chloride  flush (NS) 0.9 % injection 3 mL, 3 mL, Intravenous, Once, Patt Alm Macho, MD  Current Outpatient Medications:    albuterol  (VENTOLIN  HFA) 108 (90 Base) MCG/ACT inhaler, Inhale 1-2 puffs into the lungs every 6 (six) hours as needed for wheezing or shortness of breath., Disp: , Rfl:    apixaban  (ELIQUIS ) 5 MG TABS tablet, Take 1 tablet (5 mg total) by mouth 2 (two) times daily., Disp: 180 tablet, Rfl: 0   atorvastatin  (LIPITOR) 40 MG tablet, Take 1 tablet (40 mg total) by mouth daily., Disp: 90 tablet, Rfl: 0   ipratropium-albuterol  (DUONEB) 0.5-2.5 (3) MG/3ML SOLN, Take 3 mLs by nebulization every 6 (six) hours as needed., Disp: , Rfl:    lactose free nutrition (BOOST PLUS) LIQD, Take 237 mLs by mouth once a week. Drink 237 ml's of Strawberry Boost  Plus, Disp: , Rfl:    NICOTINE  TD, Place 1 patch onto the skin daily. (Patient not taking: Reported on 01/09/2024), Disp: , Rfl:    zonisamide  (ZONEGRAN ) 100 MG capsule, Take 1 capsule (100 mg total) by mouth every morning AND 2 capsules (200 mg total) at bedtime., Disp: 270 capsule, Rfl: 0  Vitals   Vitals:   01/18/24 2100 01/18/24 2150 01/18/24 2202  BP:  (!) 146/81 (!) 146/81  Pulse:  (!) 115 (!) 115  Resp:  (!) 39 (!) 39  Temp:  97.8 F (36.6 C) 97.8 F (36.6 C)  TempSrc:  Oral   SpO2:  100% 100%  Weight: 39.6 kg      Body mass index is 14.09 kg/m.   Physical Exam   Constitutional: Appears well-developed and well-nourished.   Neurologic Examination    Neuro: Mental Status: Patient is awake but somnolent.  He is able to follow command to make a fist but does not follow command to close his eyes.  He does not  speak at all. Cranial Nerves: II: He blinks to threat from the right but not left III,IV, VI: Gaze deviation to the right VII: Facial movement is weak on the left Motor: He has severe left-sided weakness with mildly increased tone  sensory: He does not respond as much to noxious stimulation on the left his right  Cerebellar: He does not perform       Labs/Imaging/Neurodiagnostic studies   CBC:  Recent Labs  Lab 01/25/2024 0435 01/13/24 0903  WBC 9.1 11.6*  HGB 11.4* 11.4*  HCT 34.9* 34.4*  MCV 89.7 89.4  PLT 145* 137*   Basic Metabolic Panel:  Lab Results  Component Value Date   NA 137 01/13/2024   K 4.1 01/13/2024   CO2 19 (L) 01/13/2024   GLUCOSE 89 01/13/2024   BUN 8 01/13/2024   CREATININE 0.91 01/13/2024   CALCIUM  9.2 01/13/2024   GFRNONAA >60 01/13/2024   GFRAA >60 01/04/2011   Lipid Panel:  Lab Results  Component Value Date   LDLCALC 92 01/10/2024   HgbA1c:  Lab Results  Component Value Date   HGBA1C 5.7 (H) 01/10/2024   Urine Drug Screen:     Component Value Date/Time   LABOPIA NONE DETECTED 01/10/2024 0810   COCAINSCRNUR NONE DETECTED 01/10/2024 0810   LABBENZ NONE DETECTED 01/10/2024 0810   AMPHETMU NONE DETECTED 01/10/2024 0810   THCU NONE DETECTED 01/10/2024 0810   LABBARB NONE DETECTED 01/10/2024 0810    Alcohol  Level     Component Value Date/Time   ETH <15 01/09/2024 1900   INR  Lab Results  Component Value Date   INR 0.9 01/11/2024   CT/CTA/CTP-large right hemispheric infarct  ASSESSMENT   Lonnie Blair is a 73 y.o. male with recently diagnosed metastatic adenocarcinoma and recent stroke with poor functional status prior to this most recent stroke who now has a massive right hemispheric infarct.  My suspicion is that he will likely swell over the next few days, but given his age it is unclear if the swelling alone will be a fatal process causing herniation.  He is wife reports that he has been struggling to breathe even prior  to his change today, and that he has not been feeling well at all at home.  Given the presence of metastatic cancer in a patient who is not expected to have recovery to a significant functional status, I had conversations with the patient's wife and son about what his wishes would be  in the situation.  They expressed that he would not want to pursue life-prolonging measures, and would want to focus solely on comfort care which I think is an extremely reasonable decision in this scenario.  RECOMMENDATIONS  Comfort only care, I have placed DNR/DNI order Neurology will be available as needed ______________________________________________________________________    Signed, Aisha Seals, MD [facility]

## 2024-01-18 NOTE — ED Triage Notes (Signed)
 PT BIB EMS FROM HOME WITH STROKE LIKE S/S. LKW @1800  THIS DAY PER FAMILY. PT HAD A RECENT STROKE WITH LEFT SIDE DEFICITS

## 2024-01-18 NOTE — ED Provider Notes (Signed)
 [place] EMERGENCY DEPARTMENT AT [facility] Provider Note   CSN: [id number] Arrival date & time: 01/18/24  2127     Patient presents with: Code Stroke (PT BIB EMS FROM HOME WITH STROKE LIKE S/S. LKW @1800  THIS DAY PER FAMILY. PT HAD A RECENT STROKE WITH LEFT SIDE DEFICITS )   Lonnie Blair is a 73 y.o. male with PMH of multifocal embolic stroke on Eliquis  for which he was just discharged on 7/23 with deficits of left sided weakness and metastatic cancer( primarily from lung) with complaints of aphasia three hours prior to arrival in the ED. Family says that patient has been having increased trouble breathing and decreased PO intake recently.    HPI     Prior to Admission medications   Medication Sig Start Date End Date Taking? Authorizing Provider  albuterol  (VENTOLIN  HFA) 108 (90 Base) MCG/ACT inhaler Inhale 1-2 puffs into the lungs every 6 (six) hours as needed for wheezing or shortness of breath. 10/28/23   [provider]  apixaban  (ELIQUIS ) 5 MG TABS tablet Take 1 tablet (5 mg total) by mouth 2 (two) times daily. 01/13/24   Danford, Lonni SQUIBB, MD  atorvastatin  (LIPITOR) 40 MG tablet Take 1 tablet (40 mg total) by mouth daily. 01/14/24   Danford, Lonni SQUIBB, MD  ipratropium-albuterol  (DUONEB) 0.5-2.5 (3) MG/3ML SOLN Take 3 mLs by nebulization every 6 (six) hours as needed. 10/28/23   [provider]  lactose free nutrition (BOOST PLUS) LIQD Take 237 mLs by mouth once a week. Drink 237 ml's of Strawberry Boost Plus    [provider]  NICOTINE  TD Place 1 patch onto the skin daily. Patient not taking: Reported on 01/09/2024    [provider]  zonisamide  (ZONEGRAN ) 100 MG capsule Take 1 capsule (100 mg total) by mouth every morning AND 2 capsules (200 mg total) at bedtime. 01/13/24   Danford, Lonni SQUIBB, MD    Allergies: Tylenol  [acetaminophen ]    Review of Systems Negative unless noted in the HPI  Updated Vital Signs BP 120/83    Pulse (!) 112   Temp 97.8 F (36.6 C)   Resp (!) 28   Ht 5' 6 (1.676 m)   Wt 39.6 kg   SpO2 100%   BMI 14.09 kg/m   Physical Exam Constitutional:      Comments: Chronically ill appearing and cachectic   Cardiovascular:     Rate and Rhythm: Tachycardia present.  Pulmonary:     Breath sounds: Wheezing present.     Comments: Bilaterally anteriorly  Neurological:     Comments: A&O x 0- patient was not verbal  Right sided gaze preference Right side grip strength 5/5, right sided mild drift present  Left upper extremity could not move against gravity  Right and Left bilateral lower extremity, could not lift limbs against gravity      (all labs ordered are listed, but only abnormal results are displayed) Labs Reviewed  PROTIME-INR - Abnormal; Notable for the following components:      Result Value   Prothrombin Time 18.5 (*)    INR 1.5 (*)    All other components within normal limits  APTT - Abnormal; Notable for the following components:   aPTT 39 (*)    All other components within normal limits  CBC - Abnormal; Notable for the following components:   WBC 12.9 (*)    RBC 3.22 (*)    Hemoglobin 9.3 (*)    HCT 29.3 (*)  Platelets 94 (*)    All other components within normal limits  DIFFERENTIAL - Abnormal; Notable for the following components:   Neutro Abs 9.8 (*)    Eosinophils Absolute 0.9 (*)    All other components within normal limits  COMPREHENSIVE METABOLIC PANEL WITH GFR - Abnormal; Notable for the following components:   Sodium 129 (*)    CO2 21 (*)    Glucose, Bld 135 (*)    Calcium  8.2 (*)    Total Protein 5.9 (*)    Albumin 2.3 (*)    AST 58 (*)    Alkaline Phosphatase 193 (*)    All other components within normal limits  I-STAT CHEM 8, ED - Abnormal; Notable for the following components:   Sodium 130 (*)    Glucose, Bld 136 (*)    Hemoglobin 9.9 (*)    HCT 29.0 (*)    All other components within normal limits  CBG MONITORING, ED - Abnormal;  Notable for the following components:   Glucose-Capillary 149 (*)    All other components within normal limits    EKG: None  Radiology: CT ANGIO HEAD NECK W WO CM W PERF (CODE STROKE) Result Date: 01/18/2024 CLINICAL DATA:  Neuro deficit, acute, stroke suspected EXAM: CT ANGIOGRAPHY HEAD AND NECK CT PERFUSION BRAIN TECHNIQUE: Multidetector CT imaging of the head and neck was performed using the standard protocol during bolus administration of intravenous contrast. Multiplanar CT image reconstructions and MIPs were obtained to evaluate the vascular anatomy. Carotid stenosis measurements (when applicable) are obtained utilizing NASCET criteria, using the distal internal carotid diameter as the denominator. Multiphase CT imaging of the brain was performed following IV bolus contrast injection. Subsequent parametric perfusion maps were calculated using RAPID software. RADIATION DOSE REDUCTION: This exam was performed according to the departmental dose-optimization program which includes automated exposure control, adjustment of the mA and/or kV according to patient size and/or use of iterative reconstruction technique. CONTRAST:  OMNIPAQUE  IOHEXOL  350 MG/ML SOLN COMPARISON:  CTA 01/10/2024. FINDINGS: CTA NECK FINDINGS Aortic arch: Aortic atherosclerosis. Great vessel origins are patent. Right carotid system: Persistent occlusion of the ICA with non opacification in the upper neck. Left carotid system: No evidence of dissection, stenosis (50% or greater) or occlusion. Vertebral arteries: Codominant. No evidence of dissection, stenosis (50% or greater) or occlusion. Skeleton: No acute fracture. Other neck: No acute abnormality. Upper chest: Biapical scarring. Emphysema. Redemonstrated spiculated left upper lobe pulmonary nodules, concerning for malignancy and better evaluated on CT chest from 01/09/2024. Review of the MIP images confirms the above findings CTA HEAD FINDINGS Anterior circulation: Persistent  non opacification the right intra poor opacification right ICA. There appears to be extension of thrombus farther into the right ICA terminus with new poor opacification of the right ICA. Diminished but persistent right M1 MCA and proximal M2 MCA branches opacification. Significantly diminished more distal MCA opacification. Posterior circulation: Bilateral intradural vertebral arteries, basilar artery and posterior cerebral arteries are patent without proximal high-grade stenosis. Right fetal type PCA. Venous sinuses: As permitted by contrast timing, patent. Review of the MIP images confirms the above findings CT Brain Perfusion Findings: ASPECTS: 0-1 CBF (<30%) Volume: Perfusion (Tmax>6.0s) volume: Mismatch Volume: 65mL Infarction Location: Right ACA and MCA territories. IMPRESSION: 1. Continued right ICA occlusion with suspected interval extension at the ICA terminus and new poor opacification of the right ACA as well as distal right ACA and MCA vessels/collaterals. 2. On CT perfusion, large (166 mL) right ACA and MCA  territory infarcts with 65 mL of reported penumbra in the posterior right MCA territory. 3. Redemonstrated spiculated left upper lobe pulmonary nodules, concerning for malignancy and better evaluated on CT chest from 01/09/2024. Imaging results were communicated on 01/18/2024 at 9: 43 pm to provider Dr. Michaela via telephone. Electronically Signed   By: Gilmore GORMAN Molt M.D.   On: 01/18/2024 22:09   CT HEAD CODE STROKE WO CONTRAST Result Date: 01/18/2024 CLINICAL DATA:  Code stroke.  Neuro deficit, acute, stroke suspected EXAM: CT HEAD WITHOUT CONTRAST TECHNIQUE: Contiguous axial images were obtained from the base of the skull through the vertex without intravenous contrast. RADIATION DOSE REDUCTION: This exam was performed according to the departmental dose-optimization program which includes automated exposure control, adjustment of the mA and/or kV according to patient size  and/or use of iterative reconstruction technique. COMPARISON:  CT head 01/09/2024. FINDINGS: Brain: Hypoattenuation loss of gray differentiation in the right basal ganglia, right insula and overlying frontal, pareital, and temporal lobes, compatible with acute right MCA and possibly ACA territory infarcts. No substantial midline shift. No evidence of acute mass occupying hemorrhage. No hydrocephalus. Vascular: See forthcoming CTA Skull: No acute fracture. Sinuses/Orbits: No acute findings. ASPECTS [facility] Stroke Program Early CT Score) - Ganglionic level infarction (caudate, lentiform nuclei, internal capsule, insula, M1-M3 cortex): 0 - Supraganglionic infarction (M4-M6 cortex): 0-1 Total score (0-10 with 10 being normal): 0-1 IMPRESSION: 1. Findings compatible with large acute right ACA and MCA territory infarct. No substantial midline shift. 2. ASPECTS is 0-1. Code stroke imaging results were communicated on 01/18/2024 at 9:43 pm to provider Dr. Michaela via telephone, who verbally acknowledged these results. Electronically Signed   By: Gilmore GORMAN Molt M.D.   On: 01/18/2024 21:46     Procedures   Medications Ordered in the ED  sodium chloride  flush (NS) 0.9 % injection 3 mL (3 mLs Intravenous Given 01/18/24 2214)  iohexol  (OMNIPAQUE ) 350 MG/ML injection 100 mL (100 mLs Intravenous Contrast Given 01/18/24 2135)                                    Medical Decision Making Amount and/or Complexity of Data Reviewed Labs: ordered. Radiology: ordered.  Risk Prescription drug management. Decision regarding hospitalization.   Code stroke was called on this patient. After neuro exam, patient was taken to CT scanner and findings were compatible with acute right ACA and MCA territory infarct. CT angio head and neck showed right ICA occlusion with extension of the ICA terminus. Per neurology discussion with patient family, patient has had poor baseline after being discharged from the hospital on 7/23.  Patient has been bed bound.Given this patient is not a candidate for intervention with thrombectomy due to poor functional status. Along with this, biopsy of liver showed liver metastasis. Primary is suspected to be lung and has also metastasized to pancreas. Patient is also poor candidate for chemotherapy and radiation at this time given extent of malignancy and functional status. With territory and extension of stroke, and also multi organ system metastasis, neurology discussed that patient has poor prognosis. Family agreed that given this information, patient would benefit from comfort care. Hospitalist consulted for admission for comfort care for patient and likely pending placement for hospice as well.      Final diagnoses:  Acute ischemic stroke [facility])  Occlusion of right internal carotid artery    ED Discharge Orders     None  D'Mello, Khamille Beynon, DO 01/18/24 2306    Patt Alm Macho, MD 01/18/24 [phone]

## 2024-01-18 NOTE — Telephone Encounter (Signed)
 Left the patient a voicemail on the home phone with the scheduled appointment details.

## 2024-01-19 ENCOUNTER — Encounter (HOSPITAL_COMMUNITY): Payer: Self-pay | Admitting: Internal Medicine

## 2024-01-19 DIAGNOSIS — R0989 Other specified symptoms and signs involving the circulatory and respiratory systems: Secondary | ICD-10-CM

## 2024-01-19 DIAGNOSIS — Z993 Dependence on wheelchair: Secondary | ICD-10-CM | POA: Diagnosis not present

## 2024-01-19 DIAGNOSIS — Z681 Body mass index (BMI) 19 or less, adult: Secondary | ICD-10-CM | POA: Diagnosis not present

## 2024-01-19 DIAGNOSIS — Z66 Do not resuscitate: Secondary | ICD-10-CM

## 2024-01-19 DIAGNOSIS — Z8616 Personal history of COVID-19: Secondary | ICD-10-CM | POA: Diagnosis not present

## 2024-01-19 DIAGNOSIS — Z7189 Other specified counseling: Secondary | ICD-10-CM

## 2024-01-19 DIAGNOSIS — R636 Underweight: Secondary | ICD-10-CM | POA: Diagnosis present

## 2024-01-19 DIAGNOSIS — R4701 Aphasia: Secondary | ICD-10-CM | POA: Diagnosis present

## 2024-01-19 DIAGNOSIS — R64 Cachexia: Secondary | ICD-10-CM | POA: Diagnosis present

## 2024-01-19 DIAGNOSIS — F17200 Nicotine dependence, unspecified, uncomplicated: Secondary | ICD-10-CM | POA: Diagnosis present

## 2024-01-19 DIAGNOSIS — Z515 Encounter for palliative care: Secondary | ICD-10-CM | POA: Diagnosis not present

## 2024-01-19 DIAGNOSIS — C3492 Malignant neoplasm of unspecified part of left bronchus or lung: Secondary | ICD-10-CM | POA: Diagnosis present

## 2024-01-19 DIAGNOSIS — R29727 NIHSS score 27: Secondary | ICD-10-CM | POA: Diagnosis present

## 2024-01-19 DIAGNOSIS — I63519 Cerebral infarction due to unspecified occlusion or stenosis of unspecified middle cerebral artery: Secondary | ICD-10-CM | POA: Diagnosis present

## 2024-01-19 DIAGNOSIS — Z7901 Long term (current) use of anticoagulants: Secondary | ICD-10-CM | POA: Diagnosis not present

## 2024-01-19 DIAGNOSIS — I639 Cerebral infarction, unspecified: Secondary | ICD-10-CM | POA: Diagnosis present

## 2024-01-19 DIAGNOSIS — I69398 Other sequelae of cerebral infarction: Secondary | ICD-10-CM | POA: Diagnosis not present

## 2024-01-19 DIAGNOSIS — J449 Chronic obstructive pulmonary disease, unspecified: Secondary | ICD-10-CM | POA: Diagnosis present

## 2024-01-19 DIAGNOSIS — I6521 Occlusion and stenosis of right carotid artery: Secondary | ICD-10-CM | POA: Diagnosis not present

## 2024-01-19 DIAGNOSIS — C787 Secondary malignant neoplasm of liver and intrahepatic bile duct: Secondary | ICD-10-CM | POA: Diagnosis present

## 2024-01-19 DIAGNOSIS — Z7401 Bed confinement status: Secondary | ICD-10-CM | POA: Diagnosis not present

## 2024-01-19 DIAGNOSIS — R0600 Dyspnea, unspecified: Secondary | ICD-10-CM

## 2024-01-19 DIAGNOSIS — Z1152 Encounter for screening for COVID-19: Secondary | ICD-10-CM | POA: Diagnosis not present

## 2024-01-19 DIAGNOSIS — I63521 Cerebral infarction due to unspecified occlusion or stenosis of right anterior cerebral artery: Secondary | ICD-10-CM | POA: Diagnosis present

## 2024-01-19 DIAGNOSIS — G8194 Hemiplegia, unspecified affecting left nondominant side: Secondary | ICD-10-CM | POA: Diagnosis present

## 2024-01-19 DIAGNOSIS — Z789 Other specified health status: Secondary | ICD-10-CM

## 2024-01-19 DIAGNOSIS — C7889 Secondary malignant neoplasm of other digestive organs: Secondary | ICD-10-CM | POA: Diagnosis present

## 2024-01-19 DIAGNOSIS — I10 Essential (primary) hypertension: Secondary | ICD-10-CM | POA: Diagnosis present

## 2024-01-19 DIAGNOSIS — Z886 Allergy status to analgesic agent status: Secondary | ICD-10-CM | POA: Diagnosis not present

## 2024-01-19 DIAGNOSIS — Z79899 Other long term (current) drug therapy: Secondary | ICD-10-CM | POA: Diagnosis not present

## 2024-01-19 MED ORDER — GLYCOPYRROLATE 0.2 MG/ML IJ SOLN
0.4000 mg | INTRAMUSCULAR | Status: DC
  Administered 2024-01-19 – 2024-01-21 (×14): 0.4 mg via INTRAVENOUS
  Filled 2024-01-19 (×15): qty 2

## 2024-01-19 MED ORDER — GLYCOPYRROLATE 0.2 MG/ML IJ SOLN: 0.1000 mg | Freq: Three times a day (TID) | INTRAMUSCULAR | Status: DC

## 2024-01-19 MED ORDER — HYDROMORPHONE HCL 1 MG/ML IJ SOLN
1.0000 mg | INTRAMUSCULAR | Status: DC | PRN
  Administered 2024-01-19 (×2): 1 mg via INTRAVENOUS
  Filled 2024-01-19 (×2): qty 1

## 2024-01-19 MED ORDER — HYDROMORPHONE HCL 1 MG/ML IJ SOLN
1.0000 mg | INTRAMUSCULAR | Status: DC | PRN
  Administered 2024-01-19: 1 mg via INTRAVENOUS
  Filled 2024-01-19: qty 1

## 2024-01-19 MED ORDER — LORAZEPAM 2 MG/ML IJ SOLN: 1.0000 mg | INTRAMUSCULAR | Status: DC | PRN

## 2024-01-19 MED ORDER — SODIUM CHLORIDE 0.9 % IV SOLN
0.5000 mg/h | INTRAVENOUS | Status: DC
  Administered 2024-01-19: 0.5 mg/h via INTRAVENOUS
  Filled 2024-01-19 (×2): qty 5

## 2024-01-19 MED ORDER — HYDROMORPHONE HCL-NACL 50-0.9 MG/50ML-% IV SOLN
0.5000 mg/h | INTRAVENOUS | Status: DC
  Filled 2024-01-19 (×2): qty 50

## 2024-01-19 NOTE — Consult Note (Signed)
 Consultation Note Date: 01/19/2024   Patient Name: Lonnie Blair  DOB: 22-May-1951  MRN: [id number]  Age / Sex: 73 y.o., male  PCP: [name], FNP Referring Physician: Madelyne Owen LABOR, MD  Reason for Consultation: Non pain symptom management, Pain control, Psychosocial/spiritual support, and Terminal Care  HPI/Patient Profile: 73 y.o. male  with past medical history of metastatic lung cancer, recent hospitalization for acute ischemic stroke, and seizures was  admitted on 01/18/2024 for EOL care after CT head revealed large acute ACA and MCA territory infarct. After discussions with Neurology, family expressed their wishes not to pursue life prolonging interventions and opted to focus on patient's comfort for end of life.  Clinical Assessment and Goals of Care: I have reviewed medical records including EPIC notes, labs, any available advanced directives, and imaging. Received report from primary RN - no acute concerns; however, noted concerns for unmanaged secretions and dyspnea. RN reports patient is nonresponsive.   Went to visit patient at bedside - one daughter present in person and another present via facetime. Patient was lying in bed unresponsive. No signs or non-verbal gestures of pain or discomfort noted. No respiratory distress; increased work of breathing and secretions noted. He is on 2L O2 [place].  Daughters confirm goals for full comfort measures. One tells me, whatever we have to do to keep him comfortable, we will do it.  Emotional support provided to family. Therapeutic listening provided as they reflect on the patient's life and their loving relationship with him. 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   Daughters express concern over notable secretions and shortness of breath. Symptom management plan reviewed. They are ok with  dilaudid  drip being started.  Visit also consisted of discussions dealing with the complex and emotionally intense issues of symptom management and palliative care in the setting of serious and potentially life-threatening illness. Palliative care team will continue to support patient, patient's family, and medical team.  Discussed with family the importance of continued conversation with each other and the medical providers regarding overall plan of care and treatment options, ensuring decisions are within the context of the patient's values and GOCs.    Questions and concerns were addressed. The patient/family was encouraged to call with questions and/or concerns. PMT card was provided.   Primary Decision Maker: NEXT OF KIN    SUMMARY OF RECOMMENDATIONS   Continue full comfort measures Continue DNR/DNI as previously documented Added orders for EOL symptom management and to reflect full comfort measures, as well as discontinued orders that were not focused on comfort Nursing to provide frequent assessments and administer PRN medications as clinically necessary to ensure EOL comfort PMT will continue to follow and support holistically  Symptom Management Continuous dilaudid  infusion; bolus doses PRN for pain/dyspnea/increased work of breathing/RR>25 Tylenol  PRN pain/fever Scheduled robinul  q4hr; PRN doses for secretions Haldol  PRN agitation/delirium Ativan  PRN anxiety/seizure/sleep/distress Zofran  PRN nausea/vomiting Liquifilm Tears PRN dry eye  Code Status/Advance Care Planning: DNR  Palliative Prophylaxis:  Frequent Pain Assessment and Turn Reposition  Additional Recommendations (Limitations, Scope, Preferences): Full Comfort Care  Psycho-social/Spiritual:  Desire for further Chaplaincy support:no Created space and opportunity for patient and family to express thoughts and feelings regarding patient's current medical situation.  Emotional support and therapeutic listening  provided.  Prognosis:  Hours - Days  Discharge Planning: Anticipated Hospital Death      Primary Diagnoses: Present on Admission:  Acute ischemic stroke [facility])  Expressive aphasia  I have reviewed the medical record, interviewed the patient and family, and examined the patient. The following aspects are pertinent.  Past Medical History:  Diagnosis Date   Ischemic cerebrovascular accident (CVA) (HCC)    Lung cancer (HCC)    Seizures (HCC)    Social History   Socioeconomic History   Marital status: Married    Spouse name: Not on file   Number of children: Not on file   Years of education: Not on file   Highest education level: Not on file  Occupational History   Not on file  Tobacco Use   Smoking status: Some Days   Smokeless tobacco: Not on file  Substance and Sexual Activity   Alcohol  use: Yes   Drug use: No   Sexual activity: Not on file  Other Topics Concern   Not on file  Social History Narrative   Not on file   Social Drivers of Health   Financial Resource Strain: Not on file  Food Insecurity: No Food Insecurity (01/09/2024)   Hunger Vital Sign    Worried About Running Out of Food in the Last Year: Never true    Ran Out of Food in the Last Year: Never true  Transportation Needs: No Transportation Needs (01/09/2024)   PRAPARE - Administrator, Civil Service (Medical): No    Lack of Transportation (Non-Medical): No  Physical Activity: Not on file  Stress: Not on file  Social Connections: Moderately Integrated (01/09/2024)   Social Connection and Isolation Panel    Frequency of Communication with Friends and Family: Twice a week    Frequency of Social Gatherings with Friends and Family: Twice a week    Attends Religious Services: 1 to 4 times per year    Active Member of [employer] or Organizations: No    Attends Banker Meetings: Never    Marital Status: Married   History reviewed. No pertinent family history. Scheduled Meds:   glycopyrrolate   0.4 mg Intravenous Q4H   Continuous Infusions:  HYDROmorphone  (DILAUDID ) 50 mg in sodium chloride  0.9 % 50 mL (1 mg/mL) infusion     PRN Meds:.acetaminophen  **OR** acetaminophen , artificial tears, glycopyrrolate  **OR** glycopyrrolate  **OR** glycopyrrolate , haloperidol  lactate, HYDROmorphone  (DILAUDID ) injection, LORazepam , ondansetron  **OR** ondansetron  (ZOFRAN ) IV Medications Prior to Admission:  Prior to Admission medications   Medication Sig Start Date End Date Taking? Authorizing Provider  albuterol  (VENTOLIN  HFA) 108 (90 Base) MCG/ACT inhaler Inhale 1-2 puffs into the lungs every 6 (six) hours as needed for wheezing or shortness of breath. 10/28/23   [provider]  apixaban  (ELIQUIS ) 5 MG TABS tablet Take 1 tablet (5 mg total) by mouth 2 (two) times daily. 01/13/24   Danford, Lonni SQUIBB, MD  atorvastatin  (LIPITOR) 40 MG tablet Take 1 tablet (40 mg total) by mouth daily. 01/14/24   Danford, Lonni SQUIBB, MD  ipratropium-albuterol  (DUONEB) 0.5-2.5 (3) MG/3ML SOLN Take 3 mLs by nebulization every 6 (six) hours as needed. 10/28/23   [provider]  lactose free nutrition (BOOST PLUS) LIQD Take 237 mLs by mouth once a week. Drink 237 ml's of Strawberry Boost Plus    [provider]  NICOTINE  TD Place 1 patch onto the skin daily. Patient not taking: Reported on 01/09/2024    [provider]  zonisamide  (ZONEGRAN ) 100 MG capsule Take 1 capsule (100 mg total) by mouth every morning AND 2 capsules (200 mg total) at bedtime. 01/13/24   Danford, Lonni SQUIBB, MD   Allergies  Allergen Reactions  Tylenol  [Acetaminophen ] Itching   Review of Systems  Unable to perform ROS: Patient unresponsive    Physical Exam Vitals and nursing note reviewed.  Constitutional:      General: He is not in acute distress. Pulmonary:     Effort: No respiratory distress.  Skin:    General: Skin is warm and dry.  Neurological:     Mental Status: He is  unresponsive.     Vital Signs: BP 126/79 (BP Location: Right Arm)   Pulse 68   Temp 98.5 F (36.9 C) (Oral)   Resp 17   Ht 5' 6 (1.676 m)   Wt 39.6 kg   SpO2 99%   BMI 14.09 kg/m  Pain Scale: PAINAD       SpO2: SpO2: 99 % O2 Device:SpO2: 99 % O2 Flow Rate: .   IO: Intake/output summary: No intake or output data in the 24 hours ending 01/19/24 1439  LBM: Last BM Date : 01/19/24 Baseline Weight: Weight: 39.6 kg Most recent weight: Weight: 39.6 kg     Palliative Assessment/Data: PPS 10%     Time In: 1400 Time Out: 1500 Time Total: 60 minutes  Signed by: Jeoffrey CHRISTELLA Sharps, NP   Please contact Palliative Medicine Team phone at [phone] for questions and concerns.  For individual provider: See Amion  *Portions of this note are a verbal dictation therefore any spelling and/or grammatical errors are due to the Dragon Medical One system interpretation.

## 2024-01-19 NOTE — Plan of Care (Signed)
  Problem: Pain Managment: Goal: General experience of comfort will improve and/or be controlled Outcome: Progressing   Problem: Skin Integrity: Goal: Risk for impaired skin integrity will decrease Outcome: Progressing   Problem: Nutrition: Goal: Adequate nutrition will be maintained Outcome: Not Progressing

## 2024-01-19 NOTE — Progress Notes (Signed)
 PROGRESS NOTE    Lonnie Blair  FMW:[id number] DOB: 11-May-1951 DOA: 01/18/2024 PCP: [name], FNP   Brief Narrative: 73 year old with past medical history significant for metastatic lung cancer, recent hospitalization for acute ischemic stroke admitted to [facility] on 7/28 for initiation of full comfort care measure in the setting of new large acute ischemic stroke right ACA and MCA territory, after presenting from home to [name], ED for evaluation of expressive aphasia.  Patient has a history of metastatic lung cancer, was recently hospitalized for acute ischemic stroke discharged home 01/13/2024, 3 hours prior to arrival to the ED patient developed new onset expressive aphasia and was subsequently brought in with concern for of acute stroke.  CT showed large acute right ACA and MCA territory infarct.  Neurology discussed with family goals of care and decision was to proceed with comfort care measures only.   Assessment & Plan:   Principal Problem:   Acute ischemic stroke [facility]) Active Problems:   Expressive aphasia   Occlusion of right internal carotid artery   1-Acute ischemic CVA, large acute right PCA MCA territory infarct. Comfort care measure:  -Patient admitted with Large acute right ACA, MCA stroke. Neurologist discussed with family, poor prognosis in setting large stroke and malignancy. Decision was made to proceed with comfort care.  -He has been receiving PRN IV dilaudid . Per Nurse symptoms are not controlled. Plan to start Dilaudid  Gtt.  -Palliative care consult for Symptoms management.  -PRN and schedule Rubinol for secretions.   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   Estimated body mass index is 14.09 kg/m as calculated from the following:   Height as of this encounter: 5' 6 02/01/2024 m).   Weight as of this encounter: 39.6 kg.   DVT  prophylaxis: Comfort care.  Code Status: DNR, comfort.  Family Communication: daughter at bedside.  Disposition Plan:  Status is: Observation The patient will require care spanning > 2 midnights and should be moved to inpatient because: End of life care.     Consultants:  Neurology palliative    Antimicrobials:    Subjective: Lethargic, tachypnea, Upper resp secretions.   Objective: Vitals:   01/18/24 2215 01/18/24 2226 01/18/24 2230 01/18/24 2248  BP: 120/83   126/79  Pulse: (!) 113  (!) 112 68  Resp: (!) 33  (!) 28 17  Temp:    98.5 F (36.9 C)  TempSrc:    Oral  SpO2: 99%  100% 99%  Weight:  39.6 kg    Height:  5' 6 02/01/2024 m)     No intake or output data in the 24 hours ending 01/19/24 0805 Filed Weights   01/18/24 2100 01/18/24 2226  Weight: 39.6 kg 39.6 kg    Examination:  General exam: Lethargic.  Respiratory system: BL ronchus, Congested, tachypnea.  Cardiovascular system: S1 & S2 heard, RRR. Gastrointestinal system: Abdomen is nondistended, soft and nontender. No organomegaly or masses felt. Normal bowel sounds heard. Extremities: No edema   Data Reviewed: I have personally reviewed following labs and imaging studies  CBC: Recent Labs  Lab 01/13/24 0903 01/18/24 2127 01/18/24 2215  WBC 11.6* 12.9*  --   NEUTROABS  --  9.8*  --   HGB 11.4* 9.3* 9.9*  HCT 34.4* 29.3* 29.0*  MCV 89.4 91.0  --   PLT 137* 94*  --    Basic Metabolic Panel: Recent Labs  Lab 01/13/24 0903 01/18/24 2127 01/18/24 2215  NA 137 129* 130*  K 4.1 4.7 4.4  CL 107 99 98  CO2 19* 21*  --   GLUCOSE 89 135* 136*  BUN 8 23 23   CREATININE 0.91 0.91 0.80  CALCIUM  9.2 8.2*  --    GFR: Estimated Creatinine Clearance: 46.1 mL/min (by C-G formula based on SCr of 0.8 mg/dL). Liver Function Tests: Recent Labs  Lab 01/13/24 0903 01/18/24 2127  AST 54* 58*  ALT 28 24  ALKPHOS 206* 193*  BILITOT 0.5 0.6  PROT 7.0 5.9*  ALBUMIN 2.9* 2.3*   No results for input(s):  LIPASE, AMYLASE in the last 168 hours. No results for input(s): AMMONIA in the last 168 hours. Coagulation Profile: Recent Labs  Lab 01/18/24 2127  INR 1.5*   Cardiac Enzymes: No results for input(s): CKTOTAL, CKMB, CKMBINDEX, TROPONINI in the last 168 hours. BNP (last 3 results) No results for input(s): PROBNP in the last 8760 hours. HbA1C: No results for input(s): HGBA1C in the last 72 hours. CBG: Recent Labs  Lab 01/18/24 2126  GLUCAP 149*   Lipid Profile: No results for input(s): CHOL, HDL, LDLCALC, TRIG, CHOLHDL, LDLDIRECT in the last 72 hours. Thyroid Function Tests: No results for input(s): TSH, T4TOTAL, FREET4, T3FREE, THYROIDAB in the last 72 hours. Anemia Panel: No results for input(s): VITAMINB12, FOLATE, FERRITIN, TIBC, IRON, RETICCTPCT in the last 72 hours. Sepsis Labs: No results for input(s): PROCALCITON, LATICACIDVEN in the last 168 hours.  Recent Results (from the past 240 hours)  Blood culture (routine x 2)     Status: None   Collection Time: 01/09/24 11:58 AM   Specimen: BLOOD  Result Value Ref Range Status   Specimen Description BLOOD SITE NOT SPECIFIED  Final   Special Requests   Final    BOTTLES DRAWN AEROBIC AND ANAEROBIC Blood Culture results may not be optimal due to an inadequate volume of blood received in culture bottles   Culture   Final    NO GROWTH 5 DAYS Performed at [facility], [address] [place], [place] [address]    Report Status 01/14/2024 FINAL  Final  Resp panel by RT-PCR (RSV, Flu A&B, Covid) Anterior Nasal Swab     Status: Abnormal   Collection Time: 01/09/24 11:58 AM   Specimen: Anterior Nasal Swab  Result Value Ref Range Status   SARS Coronavirus 2 by RT PCR POSITIVE (A) NEGATIVE Final   Influenza A by PCR NEGATIVE NEGATIVE Final   Influenza B by PCR NEGATIVE NEGATIVE Final    Comment: (NOTE) The Xpert Xpress SARS-CoV-2/FLU/RSV plus assay is intended as an  aid in the diagnosis of influenza from Nasopharyngeal swab specimens and should not be used as a sole basis for treatment. Nasal washings and aspirates are unacceptable for Xpert Xpress SARS-CoV-2/FLU/RSV testing.  Fact Sheet for Patients: BloggerCourse.com  Fact Sheet for Healthcare Providers: SeriousBroker.it  This test is not yet approved or cleared by the [place]  FDA and has been authorized for detection and/or diagnosis of SARS-CoV-2 by FDA under an Emergency Use Authorization (EUA). This EUA will remain in effect (meaning this test can be used) for the duration of the COVID-19 declaration under Section 564(b)(1) of the Act, 21 U.S.C. section 360bbb-3(b)(1), unless the authorization is terminated or revoked.     Resp Syncytial Virus by PCR NEGATIVE NEGATIVE Final    Comment: (NOTE) Fact Sheet for Patients: BloggerCourse.com  Fact Sheet for Healthcare Providers: SeriousBroker.it  This test is  not yet approved or cleared by the [place]  FDA and has been authorized for detection and/or diagnosis of SARS-CoV-2 by FDA under an Emergency Use Authorization (EUA). This EUA will remain in effect (meaning this test can be used) for the duration of the COVID-19 declaration under Section 564(b)(1) of the Act, 21 U.S.C. section 360bbb-3(b)(1), unless the authorization is terminated or revoked.  Performed at [facility], [address] [place], [place] [address]   Blood culture (routine x 2)     Status: None   Collection Time: 01/09/24 12:03 PM   Specimen: BLOOD  Result Value Ref Range Status   Specimen Description BLOOD SITE NOT SPECIFIED  Final   Special Requests   Final    BOTTLES DRAWN AEROBIC AND ANAEROBIC Blood Culture results may not be optimal due to an inadequate volume of blood received in culture bottles   Culture   Final    NO GROWTH 5 DAYS Performed  at [facility], [address] [place], [place] [address]    Report Status 01/14/2024 FINAL  Final  Surgical pcr screen     Status: None   Collection Time: 01/12/24  8:06 AM   Specimen: Nasal Mucosa; Nasal Swab  Result Value Ref Range Status   MRSA, PCR NEGATIVE NEGATIVE Final   Staphylococcus aureus NEGATIVE NEGATIVE Final    Comment: (NOTE) The Xpert SA Assay (FDA approved for NASAL specimens in patients 50 years of age and older), is one component of a comprehensive surveillance program. It is not intended to diagnose infection nor to guide or monitor treatment. Performed at [facility], [address], [place], [place] [address]          Radiology Studies: CT ANGIO HEAD NECK W WO CM W PERF (CODE STROKE) Result Date: 01/18/2024 CLINICAL DATA:  Neuro deficit, acute, stroke suspected EXAM: CT ANGIOGRAPHY HEAD AND NECK CT PERFUSION BRAIN TECHNIQUE: Multidetector CT imaging of the head and neck was performed using the standard protocol during bolus administration of intravenous contrast. Multiplanar CT image reconstructions and MIPs were obtained to evaluate the vascular anatomy. Carotid stenosis measurements (when applicable) are obtained utilizing NASCET criteria, using the distal internal carotid diameter as the denominator. Multiphase CT imaging of the brain was performed following IV bolus contrast injection. Subsequent parametric perfusion maps were calculated using RAPID software. RADIATION DOSE REDUCTION: This exam was performed according to the departmental dose-optimization program which includes automated exposure control, adjustment of the mA and/or kV according to patient size and/or use of iterative reconstruction technique. CONTRAST:  OMNIPAQUE  IOHEXOL  350 MG/ML SOLN COMPARISON:  CTA 01/10/2024. FINDINGS: CTA NECK FINDINGS Aortic arch: Aortic atherosclerosis. Great vessel origins are patent. Right carotid system: Persistent occlusion of the ICA with non  opacification in the upper neck. Left carotid system: No evidence of dissection, stenosis (50% or greater) or occlusion. Vertebral arteries: Codominant. No evidence of dissection, stenosis (50% or greater) or occlusion. Skeleton: No acute fracture. Other neck: No acute abnormality. Upper chest: Biapical scarring. Emphysema. Redemonstrated spiculated left upper lobe pulmonary nodules, concerning for malignancy and better evaluated on CT chest from 01/09/2024. Review of the MIP images confirms the above findings CTA HEAD FINDINGS Anterior circulation: Persistent non opacification the right intra poor opacification right ICA. There appears to be extension of thrombus farther into the right ICA terminus with new poor opacification of the right ICA. Diminished but persistent right M1 MCA and proximal M2 MCA branches opacification. Significantly diminished more distal MCA  opacification. Posterior circulation: Bilateral intradural vertebral arteries, basilar artery and posterior cerebral arteries are patent without proximal high-grade stenosis. Right fetal type PCA. Venous sinuses: As permitted by contrast timing, patent. Review of the MIP images confirms the above findings CT Brain Perfusion Findings: ASPECTS: 0-1 CBF (<30%) Volume: Perfusion (Tmax>6.0s) volume: Mismatch Volume: 65mL Infarction Location: Right ACA and MCA territories. IMPRESSION: 1. Continued right ICA occlusion with suspected interval extension at the ICA terminus and new poor opacification of the right ACA as well as distal right ACA and MCA vessels/collaterals. 2. On CT perfusion, large (166 mL) right ACA and MCA territory infarcts with 65 mL of reported penumbra in the posterior right MCA territory. 3. Redemonstrated spiculated left upper lobe pulmonary nodules, concerning for malignancy and better evaluated on CT chest from 01/09/2024. Imaging results were communicated on 01/18/2024 at 9: 43 pm to provider Dr. Michaela via telephone.  Electronically Signed   By: Gilmore GORMAN Molt M.D.   On: 01/18/2024 22:09   CT HEAD CODE STROKE WO CONTRAST Result Date: 01/18/2024 CLINICAL DATA:  Code stroke.  Neuro deficit, acute, stroke suspected EXAM: CT HEAD WITHOUT CONTRAST TECHNIQUE: Contiguous axial images were obtained from the base of the skull through the vertex without intravenous contrast. RADIATION DOSE REDUCTION: This exam was performed according to the departmental dose-optimization program which includes automated exposure control, adjustment of the mA and/or kV according to patient size and/or use of iterative reconstruction technique. COMPARISON:  CT head 01/09/2024. FINDINGS: Brain: Hypoattenuation loss of gray differentiation in the right basal ganglia, right insula and overlying frontal, pareital, and temporal lobes, compatible with acute right MCA and possibly ACA territory infarcts. No substantial midline shift. No evidence of acute mass occupying hemorrhage. No hydrocephalus. Vascular: See forthcoming CTA Skull: No acute fracture. Sinuses/Orbits: No acute findings. ASPECTS [facility] Stroke Program Early CT Score) - Ganglionic level infarction (caudate, lentiform nuclei, internal capsule, insula, M1-M3 cortex): 0 - Supraganglionic infarction (M4-M6 cortex): 0-1 Total score (0-10 with 10 being normal): 0-1 IMPRESSION: 1. Findings compatible with large acute right ACA and MCA territory infarct. No substantial midline shift. 2. ASPECTS is 0-1. Code stroke imaging results were communicated on 01/18/2024 at 9:43 pm to provider Dr. Michaela via telephone, who verbally acknowledged these results. Electronically Signed   By: Gilmore GORMAN Molt M.D.   On: 01/18/2024 21:46        Scheduled Meds: Continuous Infusions:   LOS: 0 days    Time spent: 35 Minutes    Kaitlin Alcindor A Keanna Tugwell, MD [facility]   If 7PM-7AM, please contact night-coverage [email or web address]  01/19/2024, 8:05 AM

## 2024-01-19 NOTE — Plan of Care (Signed)
   Problem: Coping: Goal: Level of anxiety will decrease Outcome: Progressing   Problem: Pain Managment: Goal: General experience of comfort will improve and/or be controlled Outcome: Progressing   Problem: Safety: Goal: Ability to remain free from injury will improve Outcome: Progressing

## 2024-01-20 DIAGNOSIS — Z7189 Other specified counseling: Secondary | ICD-10-CM | POA: Diagnosis not present

## 2024-01-20 DIAGNOSIS — I639 Cerebral infarction, unspecified: Secondary | ICD-10-CM | POA: Diagnosis not present

## 2024-01-20 DIAGNOSIS — I6521 Occlusion and stenosis of right carotid artery: Secondary | ICD-10-CM | POA: Diagnosis not present

## 2024-01-20 DIAGNOSIS — Z515 Encounter for palliative care: Secondary | ICD-10-CM | POA: Diagnosis not present

## 2024-01-20 NOTE — Plan of Care (Signed)
  Problem: Clinical Measurements: Goal: Respiratory complications will improve Outcome: Progressing   Problem: Role Relationship: Goal: Family's ability to cope with current situation will improve Outcome: Progressing Goal: Ability to verbalize concerns, feelings, and thoughts to partner or family member will improve Outcome: Progressing   Problem: Pain Management: Goal: Satisfaction with pain management regimen will improve Outcome: Progressing

## 2024-01-20 NOTE — Progress Notes (Signed)
 PROGRESS NOTE    [facility]  FMW:[id number] DOB: 06/20/1951 DOA: 01/18/2024 PCP: Lonnie Nell SAILOR, FNP    Brief Narrative:   73 year old with past medical history significant for metastatic lung cancer, recent hospitalization for acute ischemic stroke admitted to [facility] on 7/28 for initiation of full comfort care measure in the setting of new large acute ischemic stroke right ACA and MCA territory, after presenting from home to [facility], ED for evaluation of expressive aphasia.   Patient has a history of metastatic lung cancer, was recently hospitalized for acute ischemic stroke discharged home 01/13/2024, 3 hours prior to arrival to the ED patient developed new onset expressive aphasia and was subsequently brought in with concern for of acute stroke.  CT showed large acute right ACA and MCA territory infarct.   Neurology discussed with family goals of care and decision was to proceed with comfort care measures only.  Assessment & Plan:  Principal Problem:   Acute ischemic stroke [facility]) Active Problems:   Expressive aphasia   Occlusion of right internal carotid artery    Acute ischemic CVA, large acute right PCA MCA territory infarct. Comfort care measure:  -Patient admitted with Large acute right ACA, MCA stroke. Neurologist discussed with family, poor prognosis in setting large stroke and malignancy. Decision was made to proceed with comfort care.  -He has been receiving PRN IV dilaudid . Per Nurse symptoms are not controlled. Plan to start Dilaudid  Gtt.  -Palliative care consult for Symptoms management.  -PRN and schedule Rubinol for secretions.    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    Estimated body mass index is 14.09 kg/m as calculated from the following:   Height as of this encounter: 5' 6 2024/02/02 m).   Weight as of this encounter: 39.6 kg.      DVT prophylaxis: Comfort care.  Code Status: DNR, comfort.  Family Communication: Daughter at bedside Disposition Plan:  Anticipate in-hospital death    Subjective:  Seen at bedside overall comfortable  Examination:  Minimally responsive but overall comfortable.  Daughters present at bedside.                Diet Orders (From admission, onward)     Start     Ordered   01/19/24 1404  Diet regular Room service appropriate? No; Fluid consistency: Thin  Diet effective now       Comments: May eat or drink as desires for EOL - careful hand feed  Question Answer Comment  Room service appropriate? No   Fluid consistency: Thin      01/19/24 1413            Objective: Vitals:   01/18/24 2226 01/18/24 2230 01/18/24 2248 01/20/24 0357  BP:   126/79 91/71  Pulse:  (!) 112 68 (!) 127  Resp:  (!) 28 17 20   Temp:   98.5 F (36.9 C) 100.1 F (37.8 C)  TempSrc:   Oral Oral  SpO2:  100% 99% 94%  Weight: 39.6 kg     Height: 5' 6 (1.676 m)       Intake/Output Summary (Last 24 hours) at 01/20/2024 1200 Last data filed at 01/20/2024 0300 Gross per 24 hour  Intake 1.41 ml  Output --  Net 1.41 ml   Filed Weights   01/18/24 2100 01/18/24 2226  Weight: 39.6 kg 39.6 kg    Scheduled Meds:  glycopyrrolate   0.4 mg Intravenous Q4H   Continuous Infusions:  HYDROmorphone  (DILAUDID ) 50 mg in sodium chloride  0.9 % 50 mL (1 mg/mL) infusion 0.5 mg/hr (01/19/24 1444)    Nutritional status     Body mass index is 14.09 kg/m.  Data Reviewed:   CBC: Recent Labs  Lab 01/18/24 2127 01/18/24 2215  WBC 12.9*  --   NEUTROABS 9.8*  --   HGB 9.3* 9.9*  HCT 29.3* 29.0*  MCV 91.0  --   PLT 94*  --    Basic Metabolic Panel: Recent Labs  Lab 01/18/24 2127 01/18/24 2215  NA 129* 130*  K 4.7 4.4  CL 99 98  CO2 21*  --   GLUCOSE 135* 136*  BUN 23 23  CREATININE 0.91 0.80  CALCIUM  8.2*  --    GFR: Estimated Creatinine Clearance: 46.1 mL/min (by C-G formula  based on SCr of 0.8 mg/dL). Liver Function Tests: Recent Labs  Lab 01/18/24 2127  AST 58*  ALT 24  ALKPHOS 193*  BILITOT 0.6  PROT 5.9*  ALBUMIN 2.3*   No results for input(s): LIPASE, AMYLASE in the last 168 hours. No results for input(s): AMMONIA in the last 168 hours. Coagulation Profile: Recent Labs  Lab 01/18/24 2127  INR 1.5*   Cardiac Enzymes: No results for input(s): CKTOTAL, CKMB, CKMBINDEX, TROPONINI in the last 168 hours. BNP (last 3 results) No results for input(s): PROBNP in the last 8760 hours. HbA1C: No results for input(s): HGBA1C in the last 72 hours. CBG: Recent Labs  Lab 01/18/24 2126  GLUCAP 149*   Lipid Profile: No results for input(s): CHOL, HDL, LDLCALC, TRIG, CHOLHDL, LDLDIRECT in the last 72 hours. Thyroid Function Tests: No results for input(s): TSH, T4TOTAL, FREET4, T3FREE, THYROIDAB in the last 72 hours. Anemia Panel: No results for input(s): VITAMINB12, FOLATE, FERRITIN, TIBC, IRON, RETICCTPCT in the last 72 hours. Sepsis Labs: No results for input(s): PROCALCITON, LATICACIDVEN in the last 168 hours.  Recent Results (from the past 240 hours)  Surgical pcr screen     Status: None   Collection Time: 01/12/24  8:06 AM   Specimen: Nasal Mucosa; Nasal Swab  Result Value Ref Range Status   MRSA, PCR NEGATIVE NEGATIVE Final   Staphylococcus aureus NEGATIVE NEGATIVE Final    Comment: (NOTE) The Xpert SA Assay (FDA approved for NASAL specimens in patients 21 years of age and older), is one component of a comprehensive surveillance program. It is not intended to diagnose infection nor to guide or monitor treatment. Performed at [facility], [address], [place], [place] [address]          Radiology Studies: CT ANGIO HEAD NECK W WO CM W PERF (CODE STROKE) Result Date: 01/18/2024 CLINICAL DATA:  Neuro deficit, acute, stroke suspected EXAM: CT ANGIOGRAPHY HEAD AND NECK CT  PERFUSION BRAIN TECHNIQUE: Multidetector CT imaging of the head and neck was performed using the standard protocol during bolus administration of intravenous contrast. Multiplanar CT image reconstructions and MIPs were obtained to evaluate the vascular anatomy. Carotid stenosis measurements (when applicable) are obtained utilizing NASCET criteria, using the distal internal carotid diameter as the denominator. Multiphase CT imaging of the brain was performed following IV bolus contrast injection. Subsequent parametric perfusion maps were calculated using RAPID software. RADIATION DOSE REDUCTION: This exam was performed according to the departmental dose-optimization program which includes automated exposure control, adjustment of the mA and/or kV according to patient size and/or use of iterative reconstruction technique. CONTRAST:  100mL OMNIPAQUE  IOHEXOL   350 MG/ML SOLN COMPARISON:  CTA 01/10/2024. FINDINGS: CTA NECK FINDINGS Aortic arch: Aortic atherosclerosis. Great vessel origins are patent. Right carotid system: Persistent occlusion of the ICA with non opacification in the upper neck. Left carotid system: No evidence of dissection, stenosis (50% or greater) or occlusion. Vertebral arteries: Codominant. No evidence of dissection, stenosis (50% or greater) or occlusion. Skeleton: No acute fracture. Other neck: No acute abnormality. Upper chest: Biapical scarring. Emphysema. Redemonstrated spiculated left upper lobe pulmonary nodules, concerning for malignancy and better evaluated on CT chest from 01/09/2024. Review of the MIP images confirms the above findings CTA HEAD FINDINGS Anterior circulation: Persistent non opacification the right intra poor opacification right ICA. There appears to be extension of thrombus farther into the right ICA terminus with new poor opacification of the right ICA. Diminished but persistent right M1 MCA and proximal M2 MCA branches opacification. Significantly diminished more distal  MCA opacification. Posterior circulation: Bilateral intradural vertebral arteries, basilar artery and posterior cerebral arteries are patent without proximal high-grade stenosis. Right fetal type PCA. Venous sinuses: As permitted by contrast timing, patent. Review of the MIP images confirms the above findings CT Brain Perfusion Findings: ASPECTS: 0-1 CBF (<30%) Volume: Perfusion (Tmax>6.0s) volume: Mismatch Volume: 65mL Infarction Location: Right ACA and MCA territories. IMPRESSION: 1. Continued right ICA occlusion with suspected interval extension at the ICA terminus and new poor opacification of the right ACA as well as distal right ACA and MCA vessels/collaterals. 2. On CT perfusion, large (166 mL) right ACA and MCA territory infarcts with 65 mL of reported penumbra in the posterior right MCA territory. 3. Redemonstrated spiculated left upper lobe pulmonary nodules, concerning for malignancy and better evaluated on CT chest from 01/09/2024. Imaging results were communicated on 01/18/2024 at 9: 43 pm to provider Dr. Michaela via telephone. Electronically Signed   By: Gilmore GORMAN Molt M.D.   On: 01/18/2024 22:09   CT HEAD CODE STROKE WO CONTRAST Result Date: 01/18/2024 CLINICAL DATA:  Code stroke.  Neuro deficit, acute, stroke suspected EXAM: CT HEAD WITHOUT CONTRAST TECHNIQUE: Contiguous axial images were obtained from the base of the skull through the vertex without intravenous contrast. RADIATION DOSE REDUCTION: This exam was performed according to the departmental dose-optimization program which includes automated exposure control, adjustment of the mA and/or kV according to patient size and/or use of iterative reconstruction technique. COMPARISON:  CT head 01/09/2024. FINDINGS: Brain: Hypoattenuation loss of gray differentiation in the right basal ganglia, right insula and overlying frontal, pareital, and temporal lobes, compatible with acute right MCA and possibly ACA territory infarcts. No  substantial midline shift. No evidence of acute mass occupying hemorrhage. No hydrocephalus. Vascular: See forthcoming CTA Skull: No acute fracture. Sinuses/Orbits: No acute findings. ASPECTS [facility] Stroke Program Early CT Score) - Ganglionic level infarction (caudate, lentiform nuclei, internal capsule, insula, M1-M3 cortex): 0 - Supraganglionic infarction (M4-M6 cortex): 0-1 Total score (0-10 with 10 being normal): 0-1 IMPRESSION: 1. Findings compatible with large acute right ACA and MCA territory infarct. No substantial midline shift. 2. ASPECTS is 0-1. Code stroke imaging results were communicated on 01/18/2024 at 9:43 pm to provider Dr. Michaela via telephone, who verbally acknowledged these results. Electronically Signed   By: Gilmore GORMAN Molt M.D.   On: 01/18/2024 21:46           LOS: 1 day   Time spent= 35 mins    Burgess JAYSON Dare, MD [facility]  If 7PM-7AM, please contact night-coverage  01/20/2024, 12:00 PM

## 2024-01-20 NOTE — Progress Notes (Signed)
 Daily Progress Note   Patient Name: Lonnie Blair       Date: 01/20/2024 DOB: [birth year]  Age: 73 y.o. MRN#: [id number] Attending Physician: Caleen Burgess BROCKS, MD Primary Care Physician: [name], FNP Admit Date: 01/18/2024  Reason for Consultation/Follow-up: Non pain symptom management, Pain control, Psychosocial/spiritual support, and Terminal Care  Subjective: I have reviewed medical records including EPIC notes, MAR, any available advanced directives as necessary, and labs. Received report from primary RN - no acute concerns.  Went to visit patient at bedside - daughter present.  Patient was lying in bed unresponsive.  He appears much more comfortable today compared to yesterday. No signs or non-verbal gestures of pain or discomfort noted. No respiratory distress, increased work of breathing, or secretions noted.  Dilaudid  infusion running at 0.5 mg/h.  He remains on 2 L O2 nasal cannula.  Emotional support provided to daughter.  Therapeutic listening provided as she reflects on how patient left to dance and his hobbies.  Natural trajectory of end-of-life discussed as she expressed concern over a period of apnea patient experienced last night.  Discussed possible referral to hospice facility -family are not interested in patient's transfer at this time.  Will anticipate hospital death.  All questions and concerns addressed. Encouraged to call with questions and/or concerns. PMT card previously provided.  Length of Stay: 1  Current Medications: Scheduled Meds:   glycopyrrolate   0.4 mg Intravenous Q4H    Continuous Infusions:  HYDROmorphone  (DILAUDID ) 50 mg in sodium chloride  0.9 % 50 mL (1 mg/mL) infusion 0.5 mg/hr (01/19/24 1444)    PRN Meds: acetaminophen  **OR**  acetaminophen , artificial tears, glycopyrrolate  **OR** glycopyrrolate  **OR** glycopyrrolate , haloperidol  lactate, HYDROmorphone  (DILAUDID ) injection, LORazepam , ondansetron  **OR** ondansetron  (ZOFRAN ) IV  Physical Exam Vitals and nursing note reviewed.  Constitutional:      General: He is not in acute distress.    Appearance: He is ill-appearing.  Pulmonary:     Effort: No respiratory distress.  Skin:    General: Skin is warm and dry.  Neurological:     Mental Status: He is oriented to person, place, and time. He is unresponsive.             Vital Signs: BP 91/71 (BP Location: Right Arm)   Pulse (!) 127   Temp 100.1 F (37.8 C) (Oral)  Resp 20   Ht 5' 6 (1.676 m)   Wt 39.6 kg   SpO2 94%   BMI 14.09 kg/m  SpO2: SpO2: 94 % O2 Device: O2 Device: Nasal Cannula O2 Flow Rate:    Intake/output summary:  Intake/Output Summary (Last 24 hours) at 01/20/2024 0818 Last data filed at 01/20/2024 0300 Gross per 24 hour  Intake 1.41 ml  Output --  Net 1.41 ml   LBM: Last BM Date : 01/19/24 Baseline Weight: Weight: 39.6 kg Most recent weight: Weight: 39.6 kg       Palliative Assessment/Data: PPS 10%      Patient Active Problem List   Diagnosis Date Noted   Expressive aphasia 01/19/2024   Occlusion of right internal carotid artery 01/19/2024   Acute ischemic stroke (HCC) 01/18/2024   Protein-calorie malnutrition, severe 01/11/2024   Weakness generalized 01/09/2024   Metastatic disease (HCC) 01/09/2024   COVID-19 virus infection 01/09/2024   Left-sided weakness 01/09/2024   Lung mass 01/05/2023   Alcoholic pancreatitis 12/30/2022   Intractable vomiting with nausea 12/29/2022   Acute alcoholic pancreatitis 12/29/2022   Seizure disorder (HCC) 12/29/2022   Smoker 12/22/2017   Chronic obstructive pulmonary disease, unspecified (HCC) 11/27/2012   Essential hypertension 11/27/2012    Palliative Care Assessment & Plan   Patient Profile: 73 y.o. male  with past medical  history of metastatic lung cancer, recent hospitalization for acute ischemic stroke, and seizures was  admitted on 01/18/2024 for EOL care after CT head revealed large acute ACA and MCA territory infarct. After discussions with Neurology, family expressed their wishes not to pursue life prolonging interventions and opted to focus on patient's comfort for end of life.   Assessment: Principal Problem:   Acute ischemic stroke [facility]) Active Problems:   Expressive aphasia   Occlusion of right internal carotid artery   Terminal care  Recommendations/Plan: Continue full comfort measures Continue DNR/DNI as previously documented Family not interested in patient's transfer to hospice facility - anticipate hospital death Continue current comfort focused medication regimen - no changes PMT will continue to follow and support holistically  Symptom Management Continuous dilaudid  infusion; bolus doses PRN for pain/dyspnea/increased work of breathing/RR>25 Tylenol  PRN pain/fever Scheduled robinul  q4hr; PRN doses for breakthrough secretions Haldol  PRN agitation/delirium Ativan  PRN anxiety/seizure/sleep/distress Zofran  PRN nausea/vomiting Liquifilm Tears PRN dry eye  Goals of Care and Additional Recommendations: Limitations on Scope of Treatment: Full Comfort Care  Code Status:    Code Status Orders  (From admission, onward)           Start     Ordered   01/18/24 2248  Do not attempt resuscitation (DNR) - Comfort care  Continuous       Question Answer Comment  If patient has no pulse and is not breathing Do Not Attempt Resuscitation   In Pre-Arrest Conditions (Patient Is Breathing and Has a Pulse) Provide comfort measures. Relieve any mechanical airway obstruction. Avoid transfer unless required for comfort.   Consent: Discussion documented in EHR or advanced directives reviewed      01/18/24 2249           Code Status History     Date Active Date Inactive Code Status Order ID  Comments User Context   01/18/2024 2246 01/18/2024 2249 Do not attempt resuscitation (DNR) - Comfort care [id number]  Marcene Eva NOVAK, DO ED   01/18/2024 2208 01/18/2024 2246 Do not attempt resuscitation (DNR) - Comfort care [id number]  Michaela Aisha SQUIBB, MD ED   01/09/2024 [phone] 01/13/2024 1912  Full Code [id number]  [name], MD ED   12/29/2022 1706 01/05/2023 1616 Full Code [id number]  Mansy, Madison LABOR, MD Inpatient       Prognosis:  Hours - Days  Discharge Planning: Anticipated Hospital Death  Care plan was discussed with primary RN, patient's daughter  Thank you for allowing the Palliative Medicine Team to assist in the care of this patient.     Jeoffrey CHRISTELLA Sharps, NP  Please contact Palliative Medicine Team phone at [phone] for questions and concerns.   *Portions of this note are a verbal dictation therefore any spelling and/or grammatical errors are due to the Dragon [employer] One system interpretation.

## 2024-01-20 NOTE — Hospital Course (Addendum)
 Brief Narrative:   73 year old with past medical history significant for metastatic lung cancer, recent hospitalization for acute ischemic stroke admitted to [facility] on 7/28 for initiation of full comfort care measure in the setting of new large acute ischemic stroke right ACA and MCA territory, after presenting from home to [name] ED for evaluation of expressive aphasia.   Patient has a history of metastatic lung cancer, was recently hospitalized for acute ischemic stroke discharged home 01/13/2024, 3 hours prior to arrival to the ED patient developed new onset expressive aphasia and was subsequently brought in with concern for of acute stroke.  CT showed large acute right ACA and MCA territory infarct.   Neurology discussed with family goals of care and decision was to proceed with comfort care measures only.  Eventually patient was placed on Dilaudid  drip by palliative care team.  He appeared much more comfortable.  Patient ended up passing away on January 31, 2024 at 3:30 AM.  Overnight team notified family members.

## 2024-01-20 NOTE — Progress Notes (Signed)
   01/20/24 1308  TOC Brief Assessment  Insurance and Status Reviewed  Patient has primary care physician Yes  Home environment has been reviewed PTA admission home with spouse  Prior/Current Home Services No current home services  Social Drivers of Health Review SDOH reviewed no interventions necessary  Readmission risk has been reviewed No  Transition of care needs no transition of care needs at this time   Dilaudid  infusion running at 0.5 mg/h. Anticipated hospital death .    Transition of Care Department [facility]) has reviewed patient and no TOC needs have been identified at this time. We will continue to monitor patient advancement through interdisciplinary progression rounds. If new patient transition needs arise, please place a TOC consult.

## 2024-01-21 DIAGNOSIS — I639 Cerebral infarction, unspecified: Secondary | ICD-10-CM | POA: Diagnosis not present

## 2024-01-21 DIAGNOSIS — Z515 Encounter for palliative care: Secondary | ICD-10-CM | POA: Diagnosis not present

## 2024-01-21 DIAGNOSIS — Z66 Do not resuscitate: Secondary | ICD-10-CM | POA: Diagnosis not present

## 2024-01-21 DIAGNOSIS — I6521 Occlusion and stenosis of right carotid artery: Secondary | ICD-10-CM | POA: Diagnosis not present

## 2024-01-21 NOTE — Progress Notes (Signed)
 PROGRESS NOTE    Lonnie Blair  FMW:[id number] DOB: [birth year] DOA: 01/18/2024 PCP: Lonnie Nell SAILOR, FNP    Brief Narrative:   73 year old with past medical history significant for metastatic lung cancer, recent hospitalization for acute ischemic stroke admitted to [facility] on 7/28 for initiation of full comfort care measure in the setting of new large acute ischemic stroke right ACA and MCA territory, after presenting from home to [facility], ED for evaluation of expressive aphasia.   Patient has a history of metastatic lung cancer, was recently hospitalized for acute ischemic stroke discharged home 01/13/2024, 3 hours prior to arrival to the ED patient developed new onset expressive aphasia and was subsequently brought in with concern for of acute stroke.  CT showed large acute right ACA and MCA territory infarct.   Neurology discussed with family goals of care and decision was to proceed with comfort care measures only.  Assessment & Plan:  Principal Problem:   Acute ischemic stroke [facility]) Active Problems:   Expressive aphasia   Occlusion of right internal carotid artery    Acute ischemic CVA, large acute right PCA MCA territory infarct. Comfort care measure:  -Patient admitted with Large acute right ACA, MCA stroke. Neurologist discussed with family, poor prognosis in setting large stroke and malignancy. Decision was made to proceed with comfort care.  -He has been receiving PRN IV dilaudid . Per Nurse symptoms are not controlled. Plan to start Dilaudid  Gtt.  -Palliative care consult for Symptoms management.  -PRN and schedule Rubinol for secretions.    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    Estimated body mass index is 14.09 kg/m as calculated from the following:   Height as of this encounter: 5' 6 Feb 05, 2024 m).   Weight as of this encounter: 39.6 kg.      DVT prophylaxis: Comfort care.  Code Status: DNR, comfort.  Family Communication: Daughter at bedside Disposition Plan:  Anticipate in-hospital death    Subjective:  Seen at bedside overall comfortable No complaints. Remains on Dilaudid  drip.   Examination:  Minimally responsive but overall comfortable.  Daughters present at bedside.                Diet Orders (From admission, onward)     Start     Ordered   01/19/24 1404  Diet regular Room service appropriate? No; Fluid consistency: Thin  Diet effective now       Comments: May eat or drink as desires for EOL - careful hand feed  Question Answer Comment  Room service appropriate? No   Fluid consistency: Thin      01/19/24 1413            Objective: Vitals:   01/18/24 2230 01/18/24 2248 01/20/24 0357 01/21/24 0621  BP:  126/79 91/71 (!) 89/66  Pulse: (!) 112 68 (!) 127 (!) 125  Resp: (!) 28 17 20 16   Temp:  98.5 F (36.9 C) 100.1 F (37.8 C) 99.6 F (37.6 C)  TempSrc:  Oral Oral Oral  SpO2: 100% 99% 94% 94%  Weight:      Height:       No intake or output data in the 24 hours ending 01/21/24 1053 Filed Weights   01/18/24 2100 01/18/24 2226  Weight: 39.6 kg 39.6 kg    Scheduled Meds:  glycopyrrolate   0.4 mg Intravenous Q4H   Continuous Infusions:  HYDROmorphone  (DILAUDID )  50 mg in sodium chloride  0.9 % 50 mL (1 mg/mL) infusion 0.5 mg/hr (01/19/24 1444)    Nutritional status     Body mass index is 14.09 kg/m.  Data Reviewed:   CBC: Recent Labs  Lab 01/18/24 2127 01/18/24 2215  WBC 12.9*  --   NEUTROABS 9.8*  --   HGB 9.3* 9.9*  HCT 29.3* 29.0*  MCV 91.0  --   PLT 94*  --    Basic Metabolic Panel: Recent Labs  Lab 01/18/24 2127 01/18/24 2215  NA 129* 130*  K 4.7 4.4  CL 99 98  CO2 21*  --   GLUCOSE 135* 136*  BUN 23 23  CREATININE 0.91 0.80  CALCIUM  8.2*  --    GFR: Estimated Creatinine Clearance: 46.1 mL/min (by C-G formula based on SCr of 0.8 mg/dL). Liver  Function Tests: Recent Labs  Lab 01/18/24 2127  AST 58*  ALT 24  ALKPHOS 193*  BILITOT 0.6  PROT 5.9*  ALBUMIN 2.3*   No results for input(s): LIPASE, AMYLASE in the last 168 hours. No results for input(s): AMMONIA in the last 168 hours. Coagulation Profile: Recent Labs  Lab 01/18/24 2127  INR 1.5*   Cardiac Enzymes: No results for input(s): CKTOTAL, CKMB, CKMBINDEX, TROPONINI in the last 168 hours. BNP (last 3 results) No results for input(s): PROBNP in the last 8760 hours. HbA1C: No results for input(s): HGBA1C in the last 72 hours. CBG: Recent Labs  Lab 01/18/24 2126  GLUCAP 149*   Lipid Profile: No results for input(s): CHOL, HDL, LDLCALC, TRIG, CHOLHDL, LDLDIRECT in the last 72 hours. Thyroid Function Tests: No results for input(s): TSH, T4TOTAL, FREET4, T3FREE, THYROIDAB in the last 72 hours. Anemia Panel: No results for input(s): VITAMINB12, FOLATE, FERRITIN, TIBC, IRON, RETICCTPCT in the last 72 hours. Sepsis Labs: No results for input(s): PROCALCITON, LATICACIDVEN in the last 168 hours.  Recent Results (from the past 240 hours)  Surgical pcr screen     Status: None   Collection Time: 01/12/24  8:06 AM   Specimen: Nasal Mucosa; Nasal Swab  Result Value Ref Range Status   MRSA, PCR NEGATIVE NEGATIVE Final   Staphylococcus aureus NEGATIVE NEGATIVE Final    Comment: (NOTE) The Xpert SA Assay (FDA approved for NASAL specimens in patients 67 years of age and older), is one component of a comprehensive surveillance program. It is not intended to diagnose infection nor to guide or monitor treatment. Performed at [facility], [address] [place], [place] [address]          Radiology Studies: No results found.         LOS: 2 days   Time spent= 35 mins    Lonnie JAYSON Dare, MD [facility]  If 7PM-7AM, please contact night-coverage  01/21/2024, 10:53 AM

## 2024-01-21 NOTE — Progress Notes (Signed)
 Returned one 50 ml dilaudid  infusion bag  to the pharmacy to be wasted to due it expiring @ 2306 7/30 before the patient needed a new bag.

## 2024-01-21 NOTE — Plan of Care (Signed)
  Problem: Education: Goal: Knowledge of risk factors and measures for prevention of condition will improve Outcome: Not Progressing   Problem: Coping: Goal: Psychosocial and spiritual needs will be supported Outcome: Not Progressing   Problem: Respiratory: Goal: Will maintain a patent airway Outcome: Not Progressing Goal: Complications related to the disease process, condition or treatment will be avoided or minimized Outcome: Not Progressing   Problem: Education: Goal: Knowledge of General Education information will improve Description: Including pain rating scale, medication(s)/side effects and non-pharmacologic comfort measures Outcome: Not Progressing   Problem: Health Behavior/Discharge Planning: Goal: Ability to manage health-related needs will improve Outcome: Not Progressing   Problem: Clinical Measurements: Goal: Ability to maintain clinical measurements within normal limits will improve Outcome: Not Progressing Goal: Will remain free from infection Outcome: Not Progressing Goal: Diagnostic test results will improve Outcome: Not Progressing Goal: Respiratory complications will improve Outcome: Not Progressing Goal: Cardiovascular complication will be avoided Outcome: Not Progressing   Problem: Activity: Goal: Risk for activity intolerance will decrease Outcome: Not Progressing   Problem: Nutrition: Goal: Adequate nutrition will be maintained Outcome: Not Progressing   Problem: Coping: Goal: Level of anxiety will decrease Outcome: Not Progressing   Problem: Elimination: Goal: Will not experience complications related to bowel motility Outcome: Not Progressing Goal: Will not experience complications related to urinary retention Outcome: Not Progressing   Problem: Pain Managment: Goal: General experience of comfort will improve and/or be controlled Outcome: Not Progressing   Problem: Safety: Goal: Ability to remain free from injury will  improve Outcome: Not Progressing   Problem: Skin Integrity: Goal: Risk for impaired skin integrity will decrease Outcome: Not Progressing   Problem: Education: Goal: Knowledge of the prescribed therapeutic regimen will improve Outcome: Not Progressing   Problem: Coping: Goal: Ability to identify and develop effective coping behavior will improve Outcome: Not Progressing   Problem: Clinical Measurements: Goal: Quality of life will improve Outcome: Not Progressing   Problem: Respiratory: Goal: Verbalizations of increased ease of respirations will increase Outcome: Not Progressing   Problem: Role Relationship: Goal: Family's ability to cope with current situation will improve Outcome: Not Progressing Goal: Ability to verbalize concerns, feelings, and thoughts to partner or family member will improve Outcome: Not Progressing   Problem: Pain Management: Goal: Satisfaction with pain management regimen will improve Outcome: Not Progressing

## 2024-01-21 NOTE — Progress Notes (Signed)
 Daily Progress Note   Patient Name: Lonnie Blair       Date: 01/21/2024 DOB: 09/05/1950  Age: 73 y.o. MRN#: [id number] Attending Physician: Lonnie Burgess BROCKS, MD Primary Care Physician: Lonnie Nell SAILOR, FNP Admit Date: 01/18/2024  Reason for Consultation/Follow-up: Non pain symptom management, Pain control, Psychosocial/spiritual support, and Terminal Care  Subjective: I have reviewed medical records including EPIC notes, MAR, any available advanced directives as necessary, and labs. Received report from primary RN - no acute concerns.  Went to visit patient at bedside - no family/visitors present. Patient was lying in bed unresponsive. No signs or non-verbal gestures of pain or discomfort noted. No respiratory distress, increased work of breathing, or secretions noted. He remains on 2L O2  and dilaudid  drip infusing at 0.5ml/hr. He appears comfortable.   Length of Stay: 2  Current Medications: Scheduled Meds:   glycopyrrolate   0.4 mg Intravenous Q4H    Continuous Infusions:  HYDROmorphone  (DILAUDID ) 50 mg in sodium chloride  0.9 % 50 mL (1 mg/mL) infusion 0.5 mg/hr (01/19/24 1444)    PRN Meds: acetaminophen  **OR** acetaminophen , artificial tears, glycopyrrolate  **OR** glycopyrrolate  **OR** glycopyrrolate , haloperidol  lactate, HYDROmorphone  (DILAUDID ) injection, LORazepam , ondansetron  **OR** ondansetron  (ZOFRAN ) IV  Physical Exam Vitals and nursing note reviewed.  Constitutional:      General: He is not in acute distress. Pulmonary:     Effort: No respiratory distress.  Skin:    General: Skin is warm and dry.  Neurological:     Mental Status: He is unresponsive.             Vital Signs: BP (!) 89/66 (BP Location: Right Arm)   Pulse (!) 125   Temp 99.6 F (37.6 C) (Oral)    Resp 16   Ht 5' 6 (1.676 m)   Wt 39.6 kg   SpO2 94%   BMI 14.09 kg/m  SpO2: SpO2: 94 % O2 Device: O2 Device: Nasal Cannula O2 Flow Rate:    Intake/output summary: No intake or output data in the 24 hours ending 01/21/24 1055 LBM: Last BM Date : 01/19/24 Baseline Weight: Weight: 39.6 kg Most recent weight: Weight: 39.6 kg       Palliative Assessment/Data: PPS 10%      Patient Active Problem List   Diagnosis Date Noted   Expressive aphasia 01/19/2024   Occlusion of right  internal carotid artery 01/19/2024   Acute ischemic stroke (HCC) 01/18/2024   Protein-calorie malnutrition, severe 01/11/2024   Weakness generalized 01/09/2024   Metastatic disease (HCC) 01/09/2024   COVID-19 virus infection 01/09/2024   Left-sided weakness 01/09/2024   Lung mass 01/05/2023   Alcoholic pancreatitis 12/30/2022   Intractable vomiting with nausea 12/29/2022   Acute alcoholic pancreatitis 12/29/2022   Seizure disorder (HCC) 12/29/2022   Smoker 12/22/2017   Chronic obstructive pulmonary disease, unspecified (HCC) 11/27/2012   Essential hypertension 11/27/2012    Palliative Care Assessment & Plan   Patient Profile: 73 y.o. male with past medical history of metastatic lung cancer, recent hospitalization for acute ischemic stroke, and seizures was admitted on 01/18/2024 for EOL care after CT head revealed large acute ACA and MCA territory infarct. After discussions with Neurology, family expressed their wishes not to pursue life prolonging interventions and opted to focus on patient's comfort for end of life.   Assessment: Principal Problem:   Acute ischemic stroke [facility]) Active Problems:   Expressive aphasia   Occlusion of right internal carotid artery   Terminal care  Recommendations/Plan: Continue full comfort measures Continue DNR/DNI as previously documented Family not interested in patient's transfer to hospice facility - anticipate hospital death Continue current comfort focused  medication regimen - no changes PMT will continue to follow and support holistically   Symptom Management Continuous dilaudid  infusion; bolus doses PRN for pain/dyspnea/increased work of breathing/RR>25 Tylenol  PRN pain/fever Scheduled robinul  q4hr; PRN doses for breakthrough secretions Haldol  PRN agitation/delirium Ativan  PRN anxiety/seizure/sleep/distress Zofran  PRN nausea/vomiting Liquifilm Tears PRN dry eye  Goals of Care and Additional Recommendations: Limitations on Scope of Treatment: Full Comfort Care  Code Status:    Code Status Orders  (From admission, onward)           Start     Ordered   01/18/24 2248  Do not attempt resuscitation (DNR) - Comfort care  Continuous       Question Answer Comment  If patient has no pulse and is not breathing Do Not Attempt Resuscitation   In Pre-Arrest Conditions (Patient Is Breathing and Has a Pulse) Provide comfort measures. Relieve any mechanical airway obstruction. Avoid transfer unless required for comfort.   Consent: Discussion documented in EHR or advanced directives reviewed      01/18/24 2249           Code Status History     Date Active Date Inactive Code Status Order ID Comments User Context   01/18/2024 2246 01/18/2024 2249 Do not attempt resuscitation (DNR) - Comfort care [id number]  Lonnie Eva NOVAK, DO ED   01/18/2024 2208 01/18/2024 2246 Do not attempt resuscitation (DNR) - Comfort care [id number]  Lonnie Aisha SQUIBB, MD ED   01/09/2024 1653 01/13/2024 1912 Full Code [id number]  Lonnie Nydia POUR, MD ED   12/29/2022 1706 01/05/2023 1616 Full Code [id number]  Mansy, Madison LABOR, MD Inpatient       Prognosis:  Hours - Days  Discharge Planning: Anticipated Hospital Death  Care plan was discussed with RN  Thank you for allowing the Palliative Medicine Team to assist in the care of this patient.     Lonnie CHRISTELLA Sharps, NP  Please contact Palliative Medicine Team phone at [phone] for questions and concerns.    *Portions of this note are a verbal dictation therefore any spelling and/or grammatical errors are due to the Dragon [employer] One system interpretation.

## 2024-01-22 DEATH — deceased

## 2024-01-26 ENCOUNTER — Inpatient Hospital Stay

## 2024-01-26 ENCOUNTER — Ambulatory Visit: Admitting: Internal Medicine

## 2024-02-22 NOTE — Progress Notes (Signed)
 At bedside shift report night shift RN stated that the pt passed away at 0330. Post mortem care has been completed, body is bagged and waiting for transport to morgue at this time.

## 2024-02-22 NOTE — Progress Notes (Addendum)
 Family member refused to allow the  patient to be turned, stated patient is comfortable,will continue to monitor.

## 2024-02-22 NOTE — Progress Notes (Signed)
    Patient Name: Lonnie Blair           DOB: 1951-03-02  MRN: [id number]       OVERNIGHT EVENT    Notified by RN that patient has expired at 0330.  2 RN verified. Patient was comfort care.   Family has been notified by RN.     Anuel Sitter, DNP, ACNPC- AG [facility] [place]

## 2024-02-22 NOTE — Death Summary Note (Signed)
 DEATH SUMMARY   Patient Details  Name: Lonnie Blair MRN: [id number] DOB: 1951/01/30 ERE:Jwizmdnw, Nell SAILOR, FNP Admission/Discharge Information   Admit Date:  2024-01-23  Date of Death: Date of Death: 27-Jan-2024  Time of Death: Time of Death: 0330  Length of Stay: 3   Principle Cause of death: Acute ischemic stroke  Hospital Diagnoses: Principal Problem:   Acute ischemic stroke [facility]) Active Problems:   Expressive aphasia   Occlusion of right internal carotid artery   Hospital Course: Brief Narrative:   73 year old with past medical history significant for metastatic lung cancer, recent hospitalization for acute ischemic stroke admitted to [facility] on 2024/01/23 for initiation of full comfort care measure in the setting of new large acute ischemic stroke right ACA and MCA territory, after presenting from home to [name], ED for evaluation of expressive aphasia.   Patient has a history of metastatic lung cancer, was recently hospitalized for acute ischemic stroke discharged home 01/13/2024, 3 hours prior to arrival to the ED patient developed new onset expressive aphasia and was subsequently brought in with concern for of acute stroke.  CT showed large acute right ACA and MCA territory infarct.   Neurology discussed with family goals of care and decision was to proceed with comfort care measures only.  Eventually patient was placed on Dilaudid  drip by palliative care team.  He appeared much more comfortable.  Patient ended up passing away on 01-27-24 at 3:30 AM.  Overnight team notified family members.       The results of significant diagnostics from this hospitalization (including imaging, microbiology, ancillary and laboratory) are listed below for reference.   Significant Diagnostic Studies: CT ANGIO HEAD NECK W WO CM W PERF (CODE STROKE) Result Date: 2024/01/23 CLINICAL DATA:  Neuro deficit, acute, stroke suspected EXAM: CT ANGIOGRAPHY HEAD AND NECK CT PERFUSION BRAIN  TECHNIQUE: Multidetector CT imaging of the head and neck was performed using the standard protocol during bolus administration of intravenous contrast. Multiplanar CT image reconstructions and MIPs were obtained to evaluate the vascular anatomy. Carotid stenosis measurements (when applicable) are obtained utilizing NASCET criteria, using the distal internal carotid diameter as the denominator. Multiphase CT imaging of the brain was performed following IV bolus contrast injection. Subsequent parametric perfusion maps were calculated using RAPID software. RADIATION DOSE REDUCTION: This exam was performed according to the departmental dose-optimization program which includes automated exposure control, adjustment of the mA and/or kV according to patient size and/or use of iterative reconstruction technique. CONTRAST:  OMNIPAQUE  IOHEXOL  350 MG/ML SOLN COMPARISON:  CTA 01/10/2024. FINDINGS: CTA NECK FINDINGS Aortic arch: Aortic atherosclerosis. Great vessel origins are patent. Right carotid system: Persistent occlusion of the ICA with non opacification in the upper neck. Left carotid system: No evidence of dissection, stenosis (50% or greater) or occlusion. Vertebral arteries: Codominant. No evidence of dissection, stenosis (50% or greater) or occlusion. Skeleton: No acute fracture. Other neck: No acute abnormality. Upper chest: Biapical scarring. Emphysema. Redemonstrated spiculated left upper lobe pulmonary nodules, concerning for malignancy and better evaluated on CT chest from 01/09/2024. Review of the MIP images confirms the above findings CTA HEAD FINDINGS Anterior circulation: Persistent non opacification the right intra poor opacification right ICA. There appears to be extension of thrombus farther into the right ICA terminus with new poor opacification of the right ICA. Diminished but persistent right M1 MCA and proximal M2 MCA branches opacification. Significantly diminished more distal MCA opacification.  Posterior circulation: Bilateral intradural vertebral arteries, basilar artery and posterior cerebral  arteries are patent without proximal high-grade stenosis. Right fetal type PCA. Venous sinuses: As permitted by contrast timing, patent. Review of the MIP images confirms the above findings CT Brain Perfusion Findings: ASPECTS: 0-1 CBF (<30%) Volume: Perfusion (Tmax>6.0s) volume: Mismatch Volume: 65mL Infarction Location: Right ACA and MCA territories. IMPRESSION: 1. Continued right ICA occlusion with suspected interval extension at the ICA terminus and new poor opacification of the right ACA as well as distal right ACA and MCA vessels/collaterals. 2. On CT perfusion, large (166 mL) right ACA and MCA territory infarcts with 65 mL of reported penumbra in the posterior right MCA territory. 3. Redemonstrated spiculated left upper lobe pulmonary nodules, concerning for malignancy and better evaluated on CT chest from 01/09/2024. Imaging results were communicated on 01/18/2024 at 9: 43 pm to provider Dr. Michaela via telephone. Electronically Signed   By: Gilmore GORMAN Molt M.D.   On: 01/18/2024 22:09   CT HEAD CODE STROKE WO CONTRAST Result Date: 01/18/2024 CLINICAL DATA:  Code stroke.  Neuro deficit, acute, stroke suspected EXAM: CT HEAD WITHOUT CONTRAST TECHNIQUE: Contiguous axial images were obtained from the base of the skull through the vertex without intravenous contrast. RADIATION DOSE REDUCTION: This exam was performed according to the departmental dose-optimization program which includes automated exposure control, adjustment of the mA and/or kV according to patient size and/or use of iterative reconstruction technique. COMPARISON:  CT head 01/09/2024. FINDINGS: Brain: Hypoattenuation loss of gray differentiation in the right basal ganglia, right insula and overlying frontal, pareital, and temporal lobes, compatible with acute right MCA and possibly ACA territory infarcts. No substantial midline  shift. No evidence of acute mass occupying hemorrhage. No hydrocephalus. Vascular: See forthcoming CTA Skull: No acute fracture. Sinuses/Orbits: No acute findings. ASPECTS [facility] Stroke Program Early CT Score) - Ganglionic level infarction (caudate, lentiform nuclei, internal capsule, insula, M1-M3 cortex): 0 - Supraganglionic infarction (M4-M6 cortex): 0-1 Total score (0-10 with 10 being normal): 0-1 IMPRESSION: 1. Findings compatible with large acute right ACA and MCA territory infarct. No substantial midline shift. 2. ASPECTS is 0-1. Code stroke imaging results were communicated on 01/18/2024 at 9:43 pm to provider Dr. Michaela via telephone, who verbally acknowledged these results. Electronically Signed   By: Gilmore GORMAN Molt M.D.   On: 01/18/2024 21:46   US  BIOPSY (LIVER) Result Date: 01/12/2024 INDICATION: Liver metastases EXAM: ULTRASOUND LEFT LIVER METASTASIS 18 GAUGE CORE BIOPSY MEDICATIONS: 1% LIDOCAINE  LOCAL ANESTHESIA/SEDATION: Moderate (conscious) sedation was employed during this procedure. A total of Versed  1.0 mg and Fentanyl  25 mcg was administered intravenously by the radiology nurse. Total intra-service moderate Sedation Time: 8 minutes. The patient's level of consciousness and vital signs were monitored continuously by radiology nursing throughout the procedure under my direct supervision. COMPLICATIONS: None immediate. PROCEDURE: Informed written consent was obtained from the patient after a thorough discussion of the procedural risks, benefits and alternatives. All questions were addressed. Maximal Sterile Barrier Technique was utilized including caps, mask, sterile gowns, sterile gloves, sterile drape, hand hygiene and skin antiseptic. A timeout was performed prior to the initiation of the procedure. previous imaging reviewed. preliminary ultrasound performed. a hypoechoic liver lesion in the lateral segment left liver was localized and marked. under sterile conditions and local  anesthesia, the 17 gauge 6.8 cm guide needle was advanced from an anterior oblique approach into the left liver metastasis. needle position confirmed with ultrasound. two intact 18 gauge core biopsies obtained. samples placed in formalin. needle tract occluded with gel-foam. no immediate complication. patient tolerated the procedure well.  IMPRESSION: Successful ultrasound left liver metastasis 18 gauge core biopsy Electronically Signed   By: CHRISTELLA.  Shick M.D.   On: 01/12/2024 14:43   VAS US  LOWER EXTREMITY VENOUS (DVT) Result Date: 01/11/2024  Lower Venous DVT Study Patient Name:  MUSCAB BRENNEMAN  Date of Exam:   01/11/2024 Medical Rec #: [id number]      Accession #:    [id number] Date of Birth: April 07, 1951     Patient Gender: M Patient Age:   63 years Exam Location:  [facility] Procedure:      VAS US  LOWER EXTREMITY VENOUS (DVT) Referring Phys: ARY XU --------------------------------------------------------------------------------  Indications: Stroke.  Limitations: Calcific shadowing. Comparison Study: No previous exams on file. Performing Technologist: Ezzie Potters RVT, RDMS  Examination Guidelines: A complete evaluation includes B-mode imaging, spectral Doppler, color Doppler, and power Doppler as needed of all accessible portions of each vessel. Bilateral testing is considered an integral part of a complete examination. Limited examinations for reoccurring indications may be performed as noted. The reflux portion of the exam is performed with the patient in reverse Trendelenburg.  +--------+---------------+---------+-----------+----------+--------------------+ RIGHT   CompressibilityPhasicitySpontaneityPropertiesThrombus Aging       +--------+---------------+---------+-----------+----------+--------------------+ CFV     Full           Yes      Yes                                       +--------+---------------+---------+-----------+----------+--------------------+ SFJ     Full                                                               +--------+---------------+---------+-----------+----------+--------------------+ FV Prox Full           Yes      Yes                                       +--------+---------------+---------+-----------+----------+--------------------+ FV Mid                 Yes      Yes                  patent by                                                                 color/doppler        +--------+---------------+---------+-----------+----------+--------------------+ FV      Full           Yes      Yes                                       Distal                                                                    +--------+---------------+---------+-----------+----------+--------------------+  PFV     Full                                                              +--------+---------------+---------+-----------+----------+--------------------+ POP     Full           Yes      Yes                                       +--------+---------------+---------+-----------+----------+--------------------+ PTV     Full                                                              +--------+---------------+---------+-----------+----------+--------------------+ PERO    Full                                                              +--------+---------------+---------+-----------+----------+--------------------+   +---------+---------------+---------+-----------+----------+--------------+ LEFT     CompressibilityPhasicitySpontaneityPropertiesThrombus Aging +---------+---------------+---------+-----------+----------+--------------+ CFV      Full           Yes      Yes                                 +---------+---------------+---------+-----------+----------+--------------+ SFJ      Full                                                         +---------+---------------+---------+-----------+----------+--------------+ FV Prox  Full           Yes      Yes                                 +---------+---------------+---------+-----------+----------+--------------+ FV Mid   Full           Yes      Yes                                 +---------+---------------+---------+-----------+----------+--------------+ FV DistalFull           Yes      Yes                                 +---------+---------------+---------+-----------+----------+--------------+ PFV      Full                                                        +---------+---------------+---------+-----------+----------+--------------+  POP      Full           Yes      Yes                                 +---------+---------------+---------+-----------+----------+--------------+ PTV      Full                                                        +---------+---------------+---------+-----------+----------+--------------+ PERO     Full                                                        +---------+---------------+---------+-----------+----------+--------------+     Summary: BILATERAL: - No evidence of deep vein thrombosis seen in the lower extremities, bilaterally. -No evidence of popliteal cyst, bilaterally.   *See table(s) above for measurements and observations. Electronically signed by Lonni Gaskins MD on 01/11/2024 at 5:24:10 PM.    Final    ECHOCARDIOGRAM COMPLETE Result Date: 01/11/2024    ECHOCARDIOGRAM REPORT   Patient Name:   SAMY RYNER Date of Exam: 01/11/2024 Medical Rec #:  [id number]     Height:       66.0 in Accession #:    [id number]    Weight:       105.8 lb Date of Birth:  [birth year]    BSA:          1.526 m Patient Age:    72 years      BP:           142/76 mmHg Patient Gender: M             HR:           80 bpm. Exam Location:  Inpatient Procedure: 2D Echo, Cardiac Doppler and Color Doppler (Both Spectral and Color             Flow Doppler were utilized during procedure). Indications:    Stroke  History:        Patient has no prior history of Echocardiogram examinations.  Sonographer:    Benard Stallion Referring Phys: [id number] RIPUDEEP K RAI IMPRESSIONS  1. Left ventricular ejection fraction, by estimation, is 60 to 65%. The left ventricle has normal function. The left ventricle has no regional wall motion abnormalities. Left ventricular diastolic parameters are consistent with Grade I diastolic dysfunction (impaired relaxation).  2. Right ventricular systolic function is normal. The right ventricular size is normal.  3. The mitral valve is normal in structure. No evidence of mitral valve regurgitation. No evidence of mitral stenosis.  4. The aortic valve is tricuspid. There is mild calcification of the aortic valve. There is mild thickening of the aortic valve. Aortic valve regurgitation is not visualized. Aortic valve sclerosis/calcification is present, without any evidence of aortic stenosis.  5. There is Moderate (Grade III) protruding plaque involving the descending aorta.  6. The inferior vena cava is normal in size with greater than 50% respiratory variability, suggesting right atrial pressure of 3 mmHg. FINDINGS  Left Ventricle: Left ventricular ejection fraction, by estimation, is 60 to 65%. The  left ventricle has normal function. The left ventricle has no regional wall motion abnormalities. The left ventricular internal cavity size was normal in size. There is  no left ventricular hypertrophy. Left ventricular diastolic parameters are consistent with Grade I diastolic dysfunction (impaired relaxation). Right Ventricle: The right ventricular size is normal. No increase in right ventricular wall thickness. Right ventricular systolic function is normal. Left Atrium: Left atrial size was normal in size. Right Atrium: Right atrial size was normal in size. Pericardium: There is no evidence of pericardial effusion. Mitral Valve: The  mitral valve is normal in structure. No evidence of mitral valve regurgitation. No evidence of mitral valve stenosis. Tricuspid Valve: The tricuspid valve is normal in structure. Tricuspid valve regurgitation is not demonstrated. No evidence of tricuspid stenosis. Aortic Valve: The aortic valve is tricuspid. There is mild calcification of the aortic valve. There is mild thickening of the aortic valve. Aortic valve regurgitation is not visualized. Aortic valve sclerosis/calcification is present, without any evidence of aortic stenosis. Aortic valve mean gradient measures 2.0 mmHg. Aortic valve peak gradient measures 3.7 mmHg. Aortic valve area, by VTI measures 2.45 cm. Pulmonic Valve: The pulmonic valve was normal in structure. Pulmonic valve regurgitation is not visualized. No evidence of pulmonic stenosis. Aorta: The aortic root is normal in size and structure. There is moderate (Grade III) protruding plaque involving the descending aorta. Venous: The inferior vena cava is normal in size with greater than 50% respiratory variability, suggesting right atrial pressure of 3 mmHg. IAS/Shunts: No atrial level shunt detected by color flow Doppler.  LEFT VENTRICLE PLAX 2D LVIDd:         3.60 cm   Diastology LVIDs:         2.60 cm   LV e' medial:    2.83 cm/s LV PW:         0.80 cm   LV E/e' medial:  14.2 LV IVS:        0.80 cm   LV e' lateral:   3.15 cm/s LVOT diam:     2.20 cm   LV E/e' lateral: 12.8 LV SV:         43 LV SV Index:   28 LVOT Area:     3.80 cm  RIGHT VENTRICLE RV Basal diam:  2.80 cm RV Mid diam:    1.90 cm RV S prime:     15.80 cm/s LEFT ATRIUM           Index        RIGHT ATRIUM          Index LA Vol (A4C): 37.9 ml 24.84 ml/m  RA Area:     8.53 cm                                    RA Volume:   17.10 ml 11.21 ml/m  AORTIC VALVE AV Area (Vmax):    2.52 cm AV Area (Vmean):   2.43 cm AV Area (VTI):     2.45 cm AV Vmax:           96.40 cm/s AV Vmean:          65.200 cm/s AV VTI:            0.175 m AV  Peak Grad:      3.7 mmHg AV Mean Grad:      2.0 mmHg LVOT Vmax:  63.80 cm/s LVOT Vmean:        41.600 cm/s LVOT VTI:          0.113 m LVOT/AV VTI ratio: 0.65  AORTA Ao Root diam: 2.90 cm MITRAL VALVE               TRICUSPID VALVE MV Area (PHT): 4.63 cm    TR Peak grad:   16.8 mmHg MV Decel Time: 164 msec    TR Vmax:        205.00 cm/s MR Peak grad: 49.8 mmHg MR Vmax:      353.00 cm/s  SHUNTS MV E velocity: 40.30 cm/s  Systemic VTI:  0.11 m MV A velocity: 67.70 cm/s  Systemic Diam: 2.20 cm MV E/A ratio:  0.60 Mihai Croitoru MD Electronically signed by Jerel Balding MD Signature Date/Time: 01/11/2024/2:24:13 PM    Final    CT ANGIO HEAD NECK W WO CM Addendum Date: 01/10/2024 ADDENDUM REPORT: 01/10/2024 12:03 ADDENDUM: Study discussed by telephone with Dr. Jerri on 01/10/2024 at 1156 hours. Electronically Signed   By: VEAR Hurst M.D.   On: 01/10/2024 12:03   Result Date: 01/10/2024 CLINICAL DATA:  73 year old male with seizures. Metastatic pancreatic cancer. Numerous small embolic appearing infarcts on brain MRI this morning, and evidence of right ICA poor flow or occlusion on that exam. EXAM: CT ANGIOGRAPHY HEAD AND NECK WITH AND WITHOUT CONTRAST TECHNIQUE: Multidetector CT imaging of the head and neck was performed using the standard protocol during bolus administration of intravenous contrast. Multiplanar CT image reconstructions and MIPs were obtained to evaluate the vascular anatomy. Carotid stenosis measurements (when applicable) are obtained utilizing NASCET criteria, using the distal internal carotid diameter as the denominator. RADIATION DOSE REDUCTION: This exam was performed according to the departmental dose-optimization program which includes automated exposure control, adjustment of the mA and/or kV according to patient size and/or use of iterative reconstruction technique. CONTRAST:  75mL OMNIPAQUE  IOHEXOL  350 MG/ML SOLN COMPARISON:  Brain MRI 0413 hours today.  Head CT yesterday. CTA chest  yesterday. FINDINGS: CT HEAD Brain: Numerous small scattered acute infarcts are mostly occult by CT. No acute intracranial hemorrhage identified. No midline shift, mass effect, or evidence of intracranial mass lesion. No ventriculomegaly. Calvarium and skull base: Intact. No acute or suspicious osseous lesion. Paranasal sinuses: Visualized paranasal sinuses and mastoids are stable and well aerated. Orbits: No acute orbit or scalp soft tissue finding. CTA NECK Skeleton: Carious dentition. No acute or suspicious osseous lesion identified in the neck. Partially visible mild thoracic scoliosis. Upper chest: Moderate to severe emphysema. Apical lung scarring. Spiculated anterior left upper lobe lung nodule measuring 7 mm on series 11, image 163. And a larger spiculated subpleural left upper lobe lung nodule on this exam is mostly obscured by dense left subclavian venous contrast streak artifact on series 11, image 48. See Chest CTA reported yesterday. Mediastinal lymph nodes within normal limits. Visible central pulmonary arteries appear patent. Other neck: Nonvascular neck soft tissue spaces are within normal limits. Aortic arch: Calcified aortic atherosclerosis.  3 vessel arch. Right carotid system: Patent brachiocephalic artery and right CCA with minimal plaque and no stenosis. Right ICA is gradually occluded beginning at the bulb, with evidence of stringy thrombus within the ICA origin on series 11, image 113. No reconstitution at the skull base. Left carotid system: Patent left CCA origin. No significant left CCA plaque or stenosis. Patent left carotid bifurcation with minor irregularity. Patent left ICA to the skull base without stenosis. Vertebral arteries: Right  subclavian origin calcified plaque without stenosis. Normal right vertebral artery origin. Right vertebral artery is patent and normal to the skull base. Proximal left subclavian artery soft and calcified plaque without stenosis. Mild calcified plaque at  the left vertebral artery origin without stenosis. Mildly non dominant left vertebral artery is patent and normal to the skull base. CTA HEAD Posterior circulation: Patent codominant distal vertebral arteries, somewhat diminutive along with the vertebrobasilar junction. But patent without plaque or stenosis identified. PICA origins are patent. Patent and tortuous basilar artery, diminutive but without stenosis. Patent SCA origins. Fetal type bilateral PCA origins. Left PCA branches are within normal limits. Right posterior communicating artery is hypoenhancing anteriorly near the junction with the abnormal right ICA terminus (series 16, image 39). But otherwise the right MCA branches are within normal limits. Anterior circulation: No reconstitution of the right ICA siphon which is atherosclerotic. Reconstituted right MCA and ACA origins with mild irregularity there on series 15, image 39. Anterior communicating artery is present and bilateral A1 enhancement is symmetric. Bilateral ACA branches appear patent and within normal limits. Left ICA siphon is patent with calcified plaque, but no significant left siphon stenosis. Normal left posterior communicating artery origin. The on the right MCA origin irregularity the right M1 segment is enhancing symmetrically, within normal limits. Bilateral MCA M1 segments and MCA bifurcations appear patent without stenosis. No MCA branch occlusion is identified on either side. Venous sinuses: Early contrast timing, not well evaluated. Anatomic variants: Fetal type bilateral PCA origins. Review of the MIP images confirms the above findings IMPRESSION: 1. 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 No reconstitution through the right ICA supraclinoid segment. 2. Right MCA and ACA origins are reconstituted with mild irregularity there. But otherwise symmetric anterior  circulation enhancement and no anterior circulation branch occlusion identified. 3. Patent but diminutive posterior circulation appears to be on the basis of fetal type PCA origins. No significant posterior circulation stenosis. 4. Numerous small embolic appearing infarcts on brain MRI this morning are largely occult by CT. No hemorrhagic transformation or intracranial mass effect. 5. Spiculated left upper lobe lung nodules, see Chest CTA reported yesterday. Aortic Atherosclerosis (ICD10-I70.0) and Emphysema (ICD10-J43.9). Electronically Signed: By: VEAR Hurst M.D. On: 01/10/2024 11:55   EEG adult Result Date: 01/10/2024 Shelton Arlin KIDD, MD     01/10/2024  5:53 AM Patient Name: Tatsuya Okray MRN: [id number] Epilepsy Attending: Arlin KIDD Shelton Referring Physician/Provider: [name], MD Date: 01/09/2024 Duration: 25.09 mins Patient history: 73yo M with left sided weakness and seizure. EEG to evaluate for seizure. Level of alertness: Awake AEDs during EEG study: None Technical aspects: This EEG study was done with scalp electrodes positioned according to the 10-20 International system of electrode placement. Electrical activity was reviewed with band pass filter of 1-70Hz , sensitivity of 7 uV/mm, display speed of 12mm/sec with a 60Hz  notched filter applied as appropriate. EEG data were recorded continuously and digitally stored.  Video monitoring was available and reviewed as appropriate. Description: The posterior dominant rhythm consists of 7.5 Hz activity of moderate voltage (25-35 uV) seen predominantly in posterior head regions, symmetric and reactive to eye opening and eye closing. EEG showed intermittent generalized and lateralized right hemisphere 3 to 6 Hz theta-delta slowing. Hyperventilation and photic stimulation were not performed.   ABNORMALITY - Intermittent slow, generalized and lateralized right hemisphere IMPRESSION: This study is suggestive of cortical dysfunction  arising from right hemisphere  likely secondary to underlying structural abnormality, post-ictal state. Additionally there is mild to moderate diffuse encephalopathy. No seizures or epileptiform discharges were seen throughout the recording. Arlin MALVA Krebs   MR BRAIN W WO CONTRAST Addendum Date: 01/10/2024 ADDENDUM REPORT: 01/10/2024 05:12 ADDENDUM: Study discussed by telephone with PA A. Chavez on 01/10/2024 at 0505 hours. Electronically Signed   By: VEAR Hurst M.D.   On: 01/10/2024 05:12   Result Date: 01/10/2024 CLINICAL DATA:  74 year old male with seizures. Metastatic pancreatic cancer. EXAM: MRI HEAD WITHOUT AND WITH CONTRAST TECHNIQUE: Multiplanar, multiecho pulse sequences of the brain and surrounding structures were obtained without and with intravenous contrast. CONTRAST:  5mL GADAVIST  GADOBUTROL  1 MMOL/ML IV SOLN COMPARISON:  Head CT yesterday. FINDINGS: Brain: Widely scattered small infarcts in the bilateral cerebellar and right greater than left cerebral hemispheres. Anterior and posterior circulation affected, including the occipital poles. In the right hemisphere there is more conspicuous watershed pattern ischemia on series 5, image 81. The deep gray nuclei and brainstem are spared. T2 and FLAIR hyperintense cytotoxic edema in the affected areas. No acute or chronic intracranial hemorrhage identified. No abnormal enhancement identified. No dural thickening. No midline shift, mass effect, or evidence of intracranial mass lesion. No ventriculomegaly, extra-axial collection. Cervicomedullary junction and pituitary are within normal limits. And outside of the acute findings, the background gray and white matter signal is fairly normal for age. Vascular: Right ICA flow void is absent throughout the upper neck and siphon, with evidence of reconstituted flow at the right ICA terminus. Other Major intracranial vascular flow voids are preserved. Skull and upper cervical spine: Visualized bone marrow signal is within normal limits.  Negative visible cervical spine for age. Sinuses/Orbits: Negative. Other: Mastoids are clear. Visible internal auditory structures appear normal. Negative visible scalp and face. IMPRESSION: 1. Numerous small Embolic appearing acute infarcts are widely scattered in the bilateral anterior and posterior circulation. No hemorrhagic transformation or mass effect. 2. Right ICA Poor flow or Occlusion in the neck and at the skull base, with evidence of reconstituted right ICA terminus. This is age indeterminate, but most likely nonacute given the infarct pattern in #1. 3.  No metastatic disease identified. Electronically Signed: By: VEAR Hurst M.D. On: 01/10/2024 04:52   CT Angio Chest PE W and/or Wo Contrast Result Date: 01/09/2024 CLINICAL DATA:  Unilateral left-sided weakness beginning over 1 week ago. Unable to move left arm or leg. Also with cough. Patient also with abdominal pain. EXAM: CT ANGIOGRAPHY CHEST CT ABDOMEN AND PELVIS WITH CONTRAST TECHNIQUE: Multidetector CT imaging of the chest was performed using the standard protocol during bolus administration of intravenous contrast. Multiplanar CT image reconstructions and MIPs were obtained to evaluate the vascular anatomy. Multidetector CT imaging of the abdomen and pelvis was performed using the standard protocol during bolus administration of intravenous contrast. RADIATION DOSE REDUCTION: This exam was performed according to the departmental dose-optimization program which includes automated exposure control, adjustment of the mA and/or kV according to patient size and/or use of iterative reconstruction technique. CONTRAST:  75mL OMNIPAQUE  IOHEXOL  350 MG/ML SOLN COMPARISON:  01/01/2023. FINDINGS: CTA CHEST FINDINGS Cardiovascular: Pulmonary arteries are well opacified. There is no evidence of a pulmonary embolism. Heart is normal in size. No pericardial effusion. Thoracic aorta is normal in caliber. There is aortic atherosclerosis, but no dissection.  Mediastinum/Nodes: No neck base, mediastinal or hilar masses. No enlarged lymph nodes. Trachea esophagus are unremarkable. Lungs/Pleura: There is a spiculated nodule that abuts the  overlying fluoro, associated with pleural thickening. Nodule measures 11 mm in greatest axial dimension and 1.4 cm in greatest dimension on the coronal sequence. This nodule has increased in size from the prior CT. And is centered on image 19, series 7. Small spiculated nodule, anterior left upper lobe, image 34, series 7, 7 x 4 mm, mean 5.5 mm, increased in size from the prior CT. 5 mm nodule, superior segment of the left lower lobe, image 28, series 7, abuts the oblique fissure. There is adjacent nodular pleural thickening of the upper aspect of the left oblique fissure. This is similar to the prior CT. Advanced centrilobular emphysema. Stable air cyst, right lower lobe. No lung consolidation or evidence of edema. No pleural effusion or pneumothorax. Musculoskeletal: No fracture or acute finding. No bone lesion. No chest wall mass. Review of the MIP images confirms the above findings. CT ABDOMEN and PELVIS FINDINGS Hepatobiliary: Liver normal in size and overall attenuation. There are numerous small hypoattenuating masses, largest at the dome of the right lobe, 1.3 cm. Findings are consistent with metastatic disease. Gallbladder is unremarkable. No bile duct dilation. Pancreas: Pancreatic atrophy and irregular duct dilation. Both findings have progressed since the prior CT. Duct with a maximum diameter of 8 mm. Calcifications lie adjacent to the distal pancreatic and common bile duct in the pancreatic head new since the prior CT. No defined pancreatic mass. There is ill-defined soft tissue adjacent to the common bile duct along the porta hepatis. No evidence of pancreatic inflammation. Spleen: Normal in size without focal abnormality. Adrenals/Urinary Tract: No adrenal mass. Kidneys normal in overall size and position with symmetric  enhancement. There are wedge-shaped areas of hypoattenuation, anterolateral left kidney, mid to lower pole and posterior upper pole the right kidney, new since the prior CT, consistent with infarcts. No defined renal mass. No hydronephrosis. Ureters are grossly normal in course and in caliber. Bladder is unremarkable. Stomach/Bowel: Stomach is mostly decompressed, otherwise unremarkable. Grossly normal appearance of the small bowel. Right colon is mildly distended up to 5 cm. There is no wall thickening or inflammation. Vascular/Lymphatic: Prominent and mildly enlarged shoddy lymph nodes in the upper abdomen, gastrohepatic ligament, Peri celiac and aortocaval. Gastrohepatic ligament node measures largest, 1.4 cm. Nodes have increased when compared to the prior CT. Dense aortoiliac atherosclerotic calcification. No aneurysm. Reproductive: Mild stable prostate enlargement. Other: No abdominal wall hernia.  No ascites. Musculoskeletal: No fracture or acute finding. No osteoblastic or osteolytic lesions. Review of the MIP images confirms the above findings. IMPRESSION: CTA CHEST 1. No evidence of a pulmonary embolism. 2. No acute findings. 3. 2 left upper lobe pulmonary nodules, both spiculated appearance and suspicious, largest abutting the anterior pleural surface measuring 1.4 cm in greatest dimension. Both nodules have increased in size from the previous CT and are concerning for primary lung carcinoma. 4. Advanced emphysema.  Aortic atherosclerosis. CT ABDOMEN AND PELVIS 1. No acute findings. 2. Multiple low-attenuation liver masses consistent with metastatic disease, new from the prior CT. There are also enlarged lymph nodes in the upper abdomen, largest along the gastrohepatic ligament, consistent with metastatic disease. History provided for the current head CT reports pancreatic cancer. 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 3. No defined pancreatic mass, although there is irregular  dilation of the pancreatic duct that has increased when compared to the prior CT. 4. Dense aortic atherosclerosis. 5. Wedge-shaped hypoattenuating areas in both kidneys, new since the prior CT, suspected to be infarcts. Electronically Signed   By: Alm  Ormond M.D.   On: 01/09/2024 14:53   CT ABDOMEN PELVIS W CONTRAST Result Date: 01/09/2024 CLINICAL DATA:  Unilateral left-sided weakness beginning over 1 week ago. Unable to move left arm or leg. Also with cough. Patient also with abdominal pain. EXAM: CT ANGIOGRAPHY CHEST CT ABDOMEN AND PELVIS WITH CONTRAST TECHNIQUE: Multidetector CT imaging of the chest was performed using the standard protocol during bolus administration of intravenous contrast. Multiplanar CT image reconstructions and MIPs were obtained to evaluate the vascular anatomy. Multidetector CT imaging of the abdomen and pelvis was performed using the standard protocol during bolus administration of intravenous contrast. RADIATION DOSE REDUCTION: This exam was performed according to the departmental dose-optimization program which includes automated exposure control, adjustment of the mA and/or kV according to patient size and/or use of iterative reconstruction technique. CONTRAST:  75mL OMNIPAQUE  IOHEXOL  350 MG/ML SOLN COMPARISON:  01/01/2023. FINDINGS: CTA CHEST FINDINGS Cardiovascular: Pulmonary arteries are well opacified. There is no evidence of a pulmonary embolism. Heart is normal in size. No pericardial effusion. Thoracic aorta is normal in caliber. There is aortic atherosclerosis, but no dissection. Mediastinum/Nodes: No neck base, mediastinal or hilar masses. No enlarged lymph nodes. Trachea esophagus are unremarkable. Lungs/Pleura: There is a spiculated nodule that abuts the overlying fluoro, associated with pleural thickening. Nodule measures 11 mm in greatest axial dimension and 1.4 cm in greatest dimension on the coronal sequence. This nodule has increased in size from the prior CT.  And is centered on image 19, series 7. Small spiculated nodule, anterior left upper lobe, image 34, series 7, 7 x 4 mm, mean 5.5 mm, increased in size from the prior CT. 5 mm nodule, superior segment of the left lower lobe, image 28, series 7, abuts the oblique fissure. There is adjacent nodular pleural thickening of the upper aspect of the left oblique fissure. This is similar to the prior CT. Advanced centrilobular emphysema. Stable air cyst, right lower lobe. No lung consolidation or evidence of edema. No pleural effusion or pneumothorax. Musculoskeletal: No fracture or acute finding. No bone lesion. No chest wall mass. Review of the MIP images confirms the above findings. CT ABDOMEN and PELVIS FINDINGS Hepatobiliary: Liver normal in size and overall attenuation. There are numerous small hypoattenuating masses, largest at the dome of the right lobe, 1.3 cm. Findings are consistent with metastatic disease. Gallbladder is unremarkable. No bile duct dilation. Pancreas: Pancreatic atrophy and irregular duct dilation. Both findings have progressed since the prior CT. Duct with a maximum diameter of 8 mm. Calcifications lie adjacent to the distal pancreatic and common bile duct in the pancreatic head new since the prior CT. No defined pancreatic mass. There is ill-defined soft tissue adjacent to the common bile duct along the porta hepatis. No evidence of pancreatic inflammation. Spleen: Normal in size without focal abnormality. Adrenals/Urinary Tract: No adrenal mass. Kidneys normal in overall size and position with symmetric enhancement. There are wedge-shaped areas of hypoattenuation, anterolateral left kidney, mid to lower pole and posterior upper pole the right kidney, new since the prior CT, consistent with infarcts. No defined renal mass. No hydronephrosis. Ureters are grossly normal in course and in caliber. Bladder is unremarkable. Stomach/Bowel: Stomach is mostly decompressed, otherwise unremarkable. Grossly  normal appearance of the small bowel. Right colon is mildly distended up to 5 cm. There is no wall thickening or inflammation. Vascular/Lymphatic: Prominent and mildly enlarged shoddy lymph nodes in the upper abdomen, gastrohepatic ligament, Peri celiac and aortocaval. Gastrohepatic ligament node measures largest, 1.4 cm. Nodes have  increased when compared to the prior CT. Dense aortoiliac atherosclerotic calcification. No aneurysm. Reproductive: Mild stable prostate enlargement. Other: No abdominal wall hernia.  No ascites. Musculoskeletal: No fracture or acute finding. No osteoblastic or osteolytic lesions. Review of the MIP images confirms the above findings. IMPRESSION: CTA CHEST 1. No evidence of a pulmonary embolism. 2. No acute findings. 3. 2 left upper lobe pulmonary nodules, both spiculated appearance and suspicious, largest abutting the anterior pleural surface measuring 1.4 cm in greatest dimension. Both nodules have increased in size from the previous CT and are concerning for primary lung carcinoma. 4. Advanced emphysema.  Aortic atherosclerosis. CT ABDOMEN AND PELVIS 1. No acute findings. 2. Multiple low-attenuation liver masses consistent with metastatic disease, new from the prior CT. There are also enlarged lymph nodes in the upper abdomen, largest along the gastrohepatic ligament, consistent with metastatic disease. History provided for the current head CT reports pancreatic cancer. 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 3. No defined pancreatic mass, although there is irregular dilation of the pancreatic duct that has increased when compared to the prior CT. 4. Dense aortic atherosclerosis. 5. Wedge-shaped hypoattenuating areas in both kidneys, new since the prior CT, suspected to be infarcts. Electronically Signed   By: Alm Parkins M.D.   On: 01/09/2024 14:53   CT Head Wo Contrast Result Date: 01/09/2024 CLINICAL DATA:  Mental status change. Multiple seizures today.  History of pancreatic carcinoma. EXAM: CT HEAD WITHOUT CONTRAST TECHNIQUE: Contiguous axial images were obtained from the base of the skull through the vertex without intravenous contrast. RADIATION DOSE REDUCTION: This exam was performed according to the departmental dose-optimization program which includes automated exposure control, adjustment of the mA and/or kV according to patient size and/or use of iterative reconstruction technique. COMPARISON:  12/26/2020. FINDINGS: Brain: No evidence of acute infarction, hemorrhage, hydrocephalus, extra-axial collection or mass lesion/mass effect. Vascular: No hyperdense vessel or unexpected calcification. Skull: Normal. Negative for fracture or focal lesion. Sinuses/Orbits: Globes and orbits are unremarkable. Visualized sinuses are clear. Other: None. IMPRESSION: No acute intracranial abnormalities. Electronically Signed   By: Alm Parkins M.D.   On: 01/09/2024 14:27   DG Elbow Complete Left Result Date: 01/09/2024 CLINICAL DATA:  Elbow pain. History of pancreatic cancer. No history of reported trauma EXAM: LEFT ELBOW - COMPLETE 2 VIEW COMPARISON:  None Available. FINDINGS: Hyperostosis along the olecranon. No fracture or dislocation. Grossly preserved joint spaces. No large joint effusion on the lateral view is somewhat rotated limiting evaluation for subtle joint effusion. Repeat lateral film as clinically appropriate. IMPRESSION: Grossly no acute osseous abnormality.  Limited lateral view. Electronically Signed   By: Ranell Bring M.D.   On: 01/09/2024 12:39    Microbiology: No results found for this or any previous visit (from the past 240 hours).  Time spent: 20 minutes  Signed: Burgess JAYSON Dare, MD Jan 28, 2024

## 2024-02-22 NOTE — Progress Notes (Signed)
 LATE ENTRY This reporter went to patient room to administer patient medication,the patient was found unresponsive,with no pulse,no respiration and no observable  movement.The charge nurse was informed and verified with the reporter.The on call provider,Abigail Chavez,NP was notified. The patient's family,who was present at bedside,was informed about the passing of their loved one,family was given the opportunity to say their final goodbyes and spend some time with the patient. The charge nurse provided support and assisted in completing the flow sheet documentation. Eye prep completed. The patient's body was prepared with dignity and care

## 2024-02-22 DEATH — deceased
